# Patient Record
Sex: Female | Born: 1975 | Hispanic: Yes | Marital: Single | Smoking: Never smoker
Health system: Southern US, Community
[De-identification: ages and names within clinical notes are randomized; demographics above are authoritative.]

## PROBLEM LIST (undated history)

## (undated) ENCOUNTER — Inpatient Hospital Stay (HOSPITAL_COMMUNITY): Payer: Self-pay

## (undated) DIAGNOSIS — E119 Type 2 diabetes mellitus without complications: Secondary | ICD-10-CM

## (undated) DIAGNOSIS — K219 Gastro-esophageal reflux disease without esophagitis: Secondary | ICD-10-CM

## (undated) DIAGNOSIS — K760 Fatty (change of) liver, not elsewhere classified: Secondary | ICD-10-CM

## (undated) DIAGNOSIS — O24419 Gestational diabetes mellitus in pregnancy, unspecified control: Secondary | ICD-10-CM

## (undated) DIAGNOSIS — F419 Anxiety disorder, unspecified: Secondary | ICD-10-CM

## (undated) DIAGNOSIS — K8020 Calculus of gallbladder without cholecystitis without obstruction: Secondary | ICD-10-CM

## (undated) DIAGNOSIS — F32A Depression, unspecified: Secondary | ICD-10-CM

## (undated) HISTORY — DX: Fatty (change of) liver, not elsewhere classified: K76.0

## (undated) HISTORY — DX: Calculus of gallbladder without cholecystitis without obstruction: K80.20

## (undated) HISTORY — DX: Type 2 diabetes mellitus without complications: E11.9

## (undated) HISTORY — DX: Gestational diabetes mellitus in pregnancy, unspecified control: O24.419

## (undated) HISTORY — DX: Anxiety disorder, unspecified: F41.9

## (undated) HISTORY — DX: Depression, unspecified: F32.A

## (undated) HISTORY — DX: Gastro-esophageal reflux disease without esophagitis: K21.9

---

## 1998-10-29 ENCOUNTER — Ambulatory Visit (HOSPITAL_COMMUNITY): Admission: RE | Admit: 1998-10-29 | Discharge: 1998-10-29 | Payer: Self-pay | Admitting: *Deleted

## 1999-04-16 ENCOUNTER — Inpatient Hospital Stay (HOSPITAL_COMMUNITY): Admission: AD | Admit: 1999-04-16 | Discharge: 1999-04-17 | Payer: Self-pay | Admitting: Obstetrics

## 2000-08-22 ENCOUNTER — Ambulatory Visit (HOSPITAL_COMMUNITY): Admission: RE | Admit: 2000-08-22 | Discharge: 2000-08-22 | Payer: Self-pay | Admitting: Obstetrics

## 2001-01-25 ENCOUNTER — Inpatient Hospital Stay (HOSPITAL_COMMUNITY): Admission: AD | Admit: 2001-01-25 | Discharge: 2001-01-25 | Payer: Self-pay | Admitting: *Deleted

## 2001-01-29 ENCOUNTER — Encounter (HOSPITAL_COMMUNITY): Admission: RE | Admit: 2001-01-29 | Discharge: 2001-02-02 | Payer: Self-pay | Admitting: Obstetrics & Gynecology

## 2001-02-01 ENCOUNTER — Encounter (INDEPENDENT_AMBULATORY_CARE_PROVIDER_SITE_OTHER): Payer: Self-pay | Admitting: Specialist

## 2001-02-01 ENCOUNTER — Inpatient Hospital Stay (HOSPITAL_COMMUNITY): Admission: AD | Admit: 2001-02-01 | Discharge: 2001-02-04 | Payer: Self-pay | Admitting: Obstetrics & Gynecology

## 2001-02-01 DIAGNOSIS — O321XX0 Maternal care for breech presentation, not applicable or unspecified: Secondary | ICD-10-CM

## 2004-03-12 ENCOUNTER — Inpatient Hospital Stay (HOSPITAL_COMMUNITY): Admission: AD | Admit: 2004-03-12 | Discharge: 2004-03-13 | Payer: Self-pay | Admitting: Family Medicine

## 2004-03-13 IMAGING — US US OB TRANSVAGINAL MODIFY
1 series · 18 of 28 positions shown · non-contrast
Comparison: none

CLINICAL DATA: By dates, 10 weeks 6 days pregnant.  Vaginal bleeding for 3 days.
 OBSTETRICAL ULTRASOUND WITH TRANSVAGINAL EVALUATION
 No comparison.  The patient?s LMP is [DATE].
 Transabdominal and endovaginal imaging was performed.  There is an intrauterine gestational sac which is irregular in shape.  This has a mean sac diameter of 1.4 cm, corresponding with a gestational age of 6 weeks 2 days.  Irregular subchorionic hematoma is noted.  No yolk sac or embryo is seen within the gestational sac.
 Both maternal ovaries appear normal, measuring 3.3 x 2.0 x 1.8 cm on the right and 2.1 x 1.5 x 2.5 cm on the left.  There is a corpus luteal cyst on the right.  No free pelvic fluid is present.
 IMPRESSION 
 There is an intrauterine gestational sac, but no embryo or yolk sac is identified.  A yolk sac should be identified by this age, but an embryo is not necessarily visible.  These findings are suspicious for an abortion in progress or an anembryonic pregnancy.  These findings are not definitive, and correlation with serial quantitative beta hCG level and follow-up ultrasound, if necessary, are recommended.

[Series 1: us ob comp<14 wk · 18 of 40 slices shown]
[im 1/40]
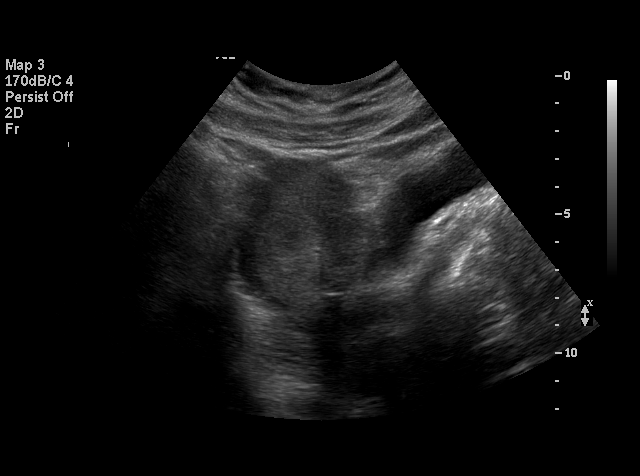
[im 3/40]
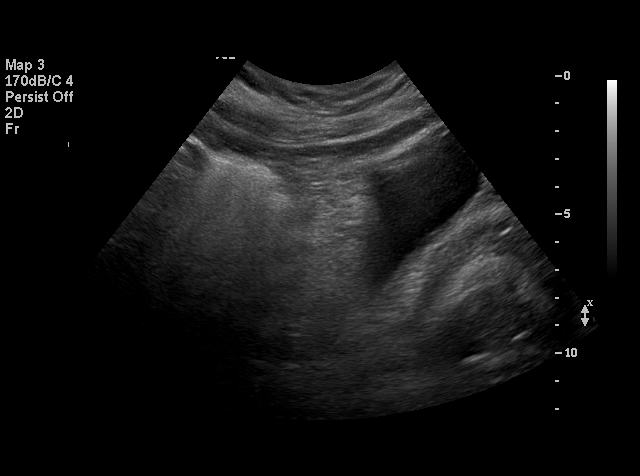
[im 5/40]
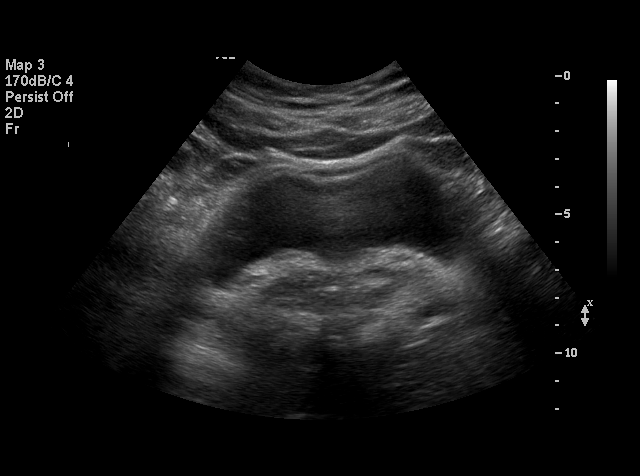
[im 8/40]
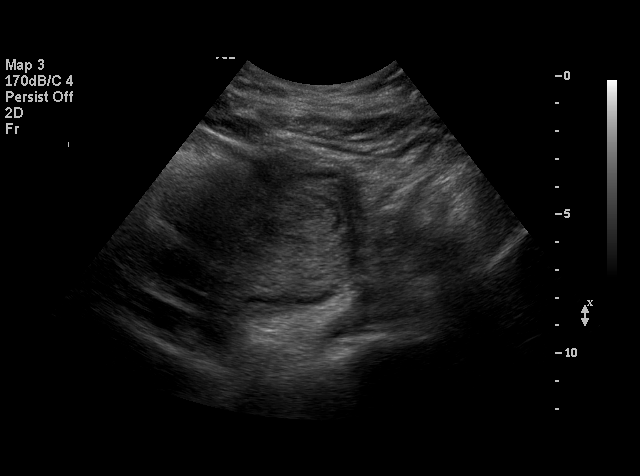
[im 11/40]
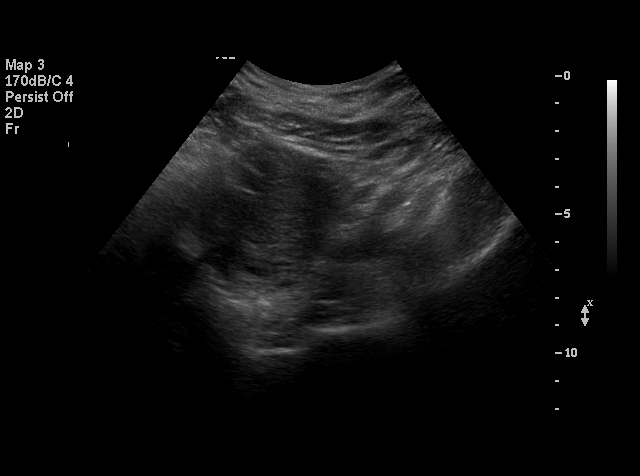
[im 12/40]
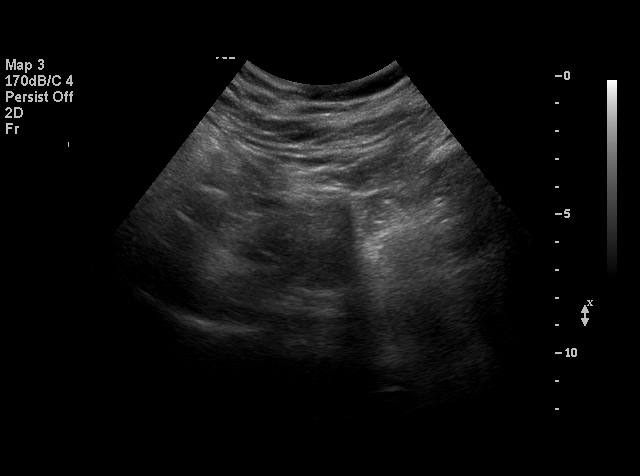
[im 15/40]
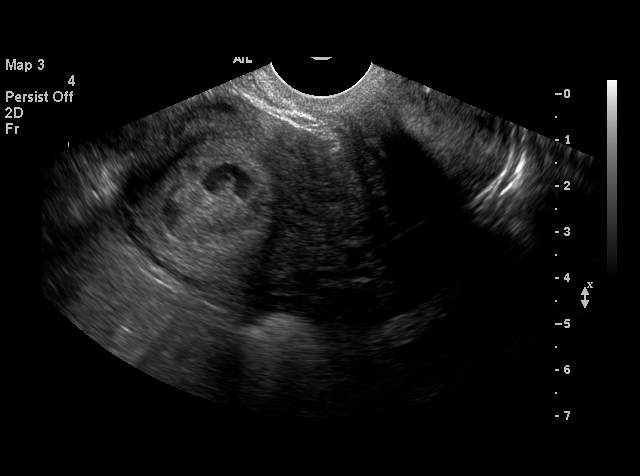
[im 16/40]
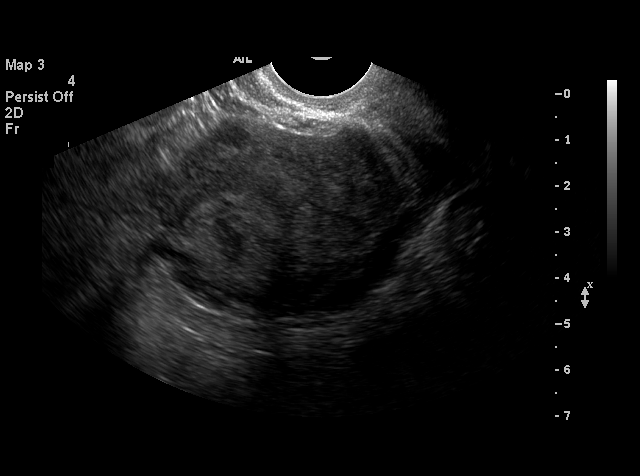
[im 19/40]
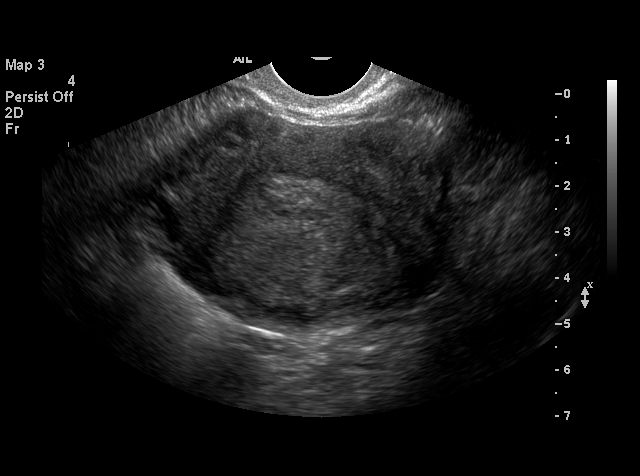
[im 21/40]
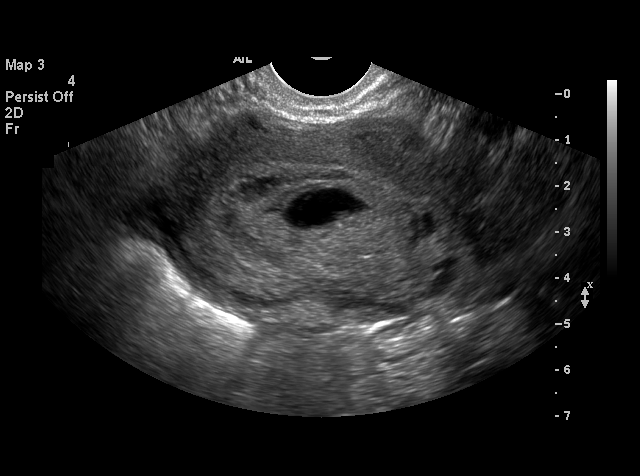
[im 24/40]
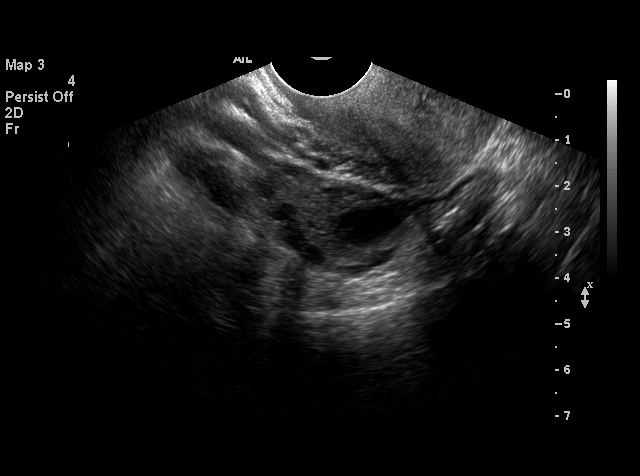
[im 25/40]
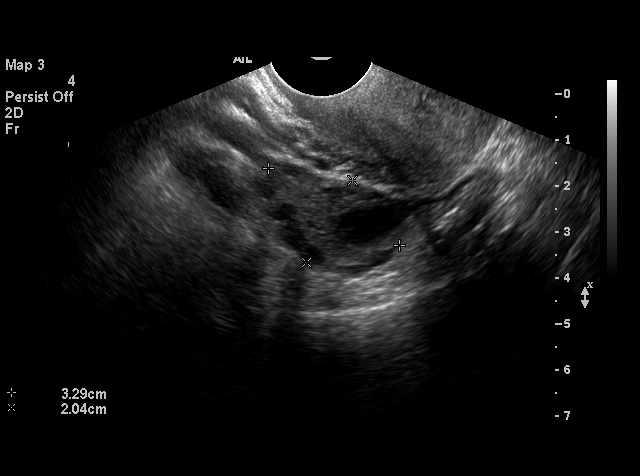
[im 28/40]
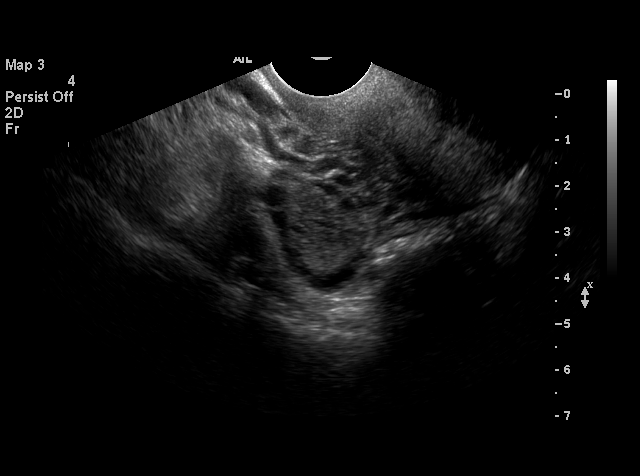
[im 31/40]
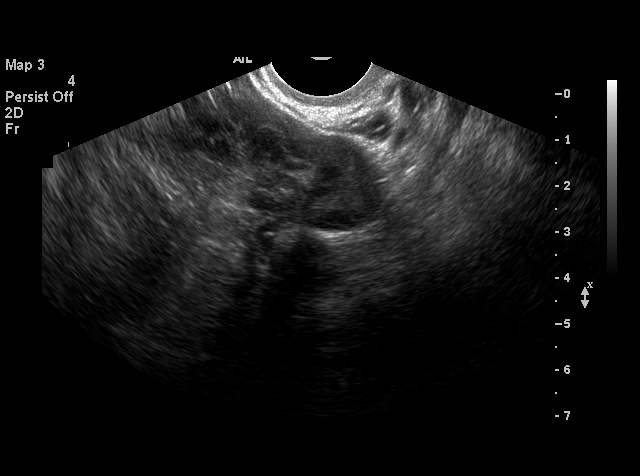
[im 32/40]
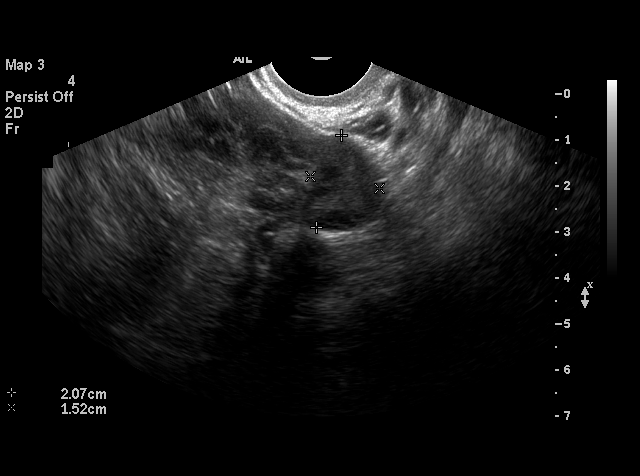
[im 35/40]
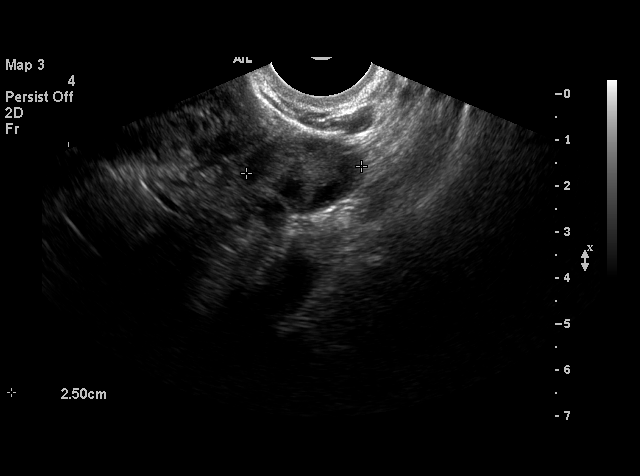
[im 37/40]
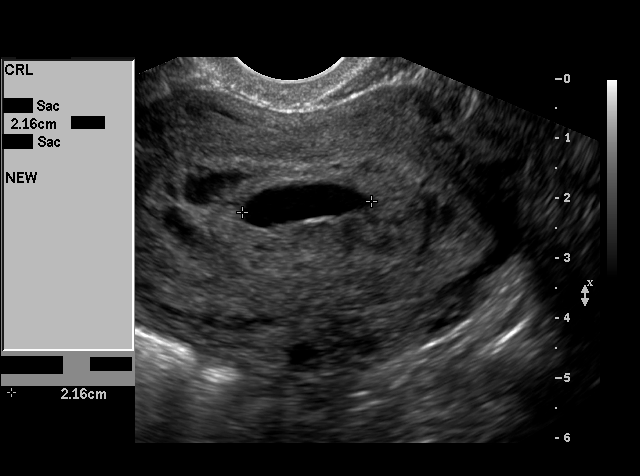
[im 40/40]
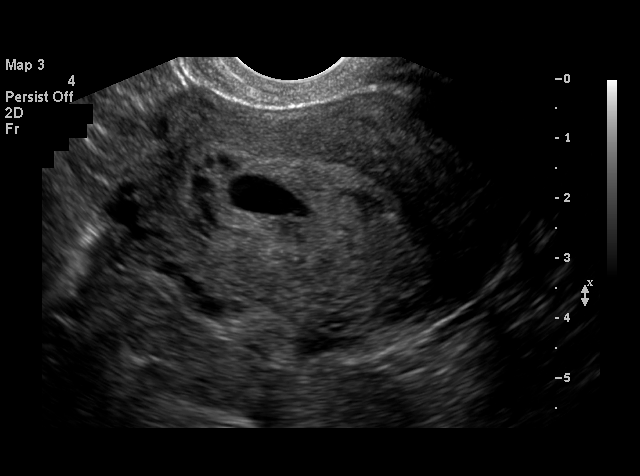

[18 of 28 positions shown; findings below may reference images not displayed]

## 2004-03-18 ENCOUNTER — Inpatient Hospital Stay (HOSPITAL_COMMUNITY): Admission: RE | Admit: 2004-03-18 | Discharge: 2004-03-18 | Payer: Self-pay | Admitting: Family Medicine

## 2004-03-18 IMAGING — US US OB TRANSVAGINAL
1 series · 14 of 28 positions shown · non-contrast
Comparison: none

CLINICAL DATA: Reevaluate intrauterine gestational sac.  Continued vaginal bleeding with cramping.
TRANSVAGINAL OBSTETRICAL ULTRASOUND:
Multiple images of the uterus and adnexa were obtained using an endovaginal approach. Comparison is made with the previous exam on [DATE].  
The endometrial lining today appears thin with a maximal AP width measured at 8.2 mm.  No evidence for a gestational sac is seen and the overall appearance is compatible with an interval spontaneous abortion with no evidence for retained products of conception sonographically.
Both ovaries are visualized with the left ovary measuring 2.7 x 1.3 x 2.5 cm and the right ovary measuring 3.1 x 1.7 x 1.0 cm.  This ovary contains a corpus luteum cyst.  No cul-de-sac or periovarian fluid is seen.
IMPRESSION
Findings compatible with a complete spontaneous abortion.  
Normalovaries.

[Series 1: unknown · 0.15mm/px · 14 of 41 slices shown]
[im 2/41]
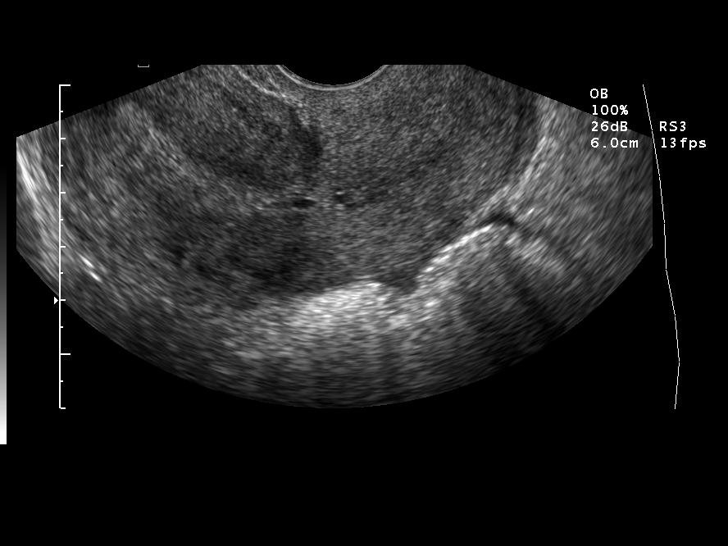
[im 5/41]
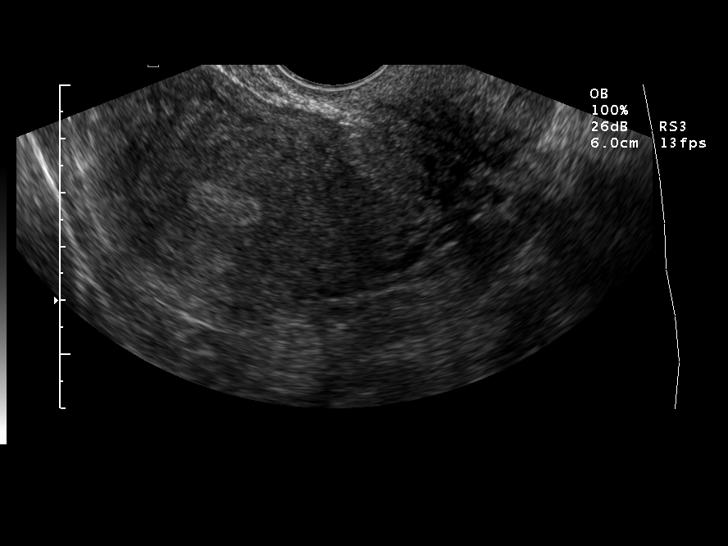
[im 8/41]
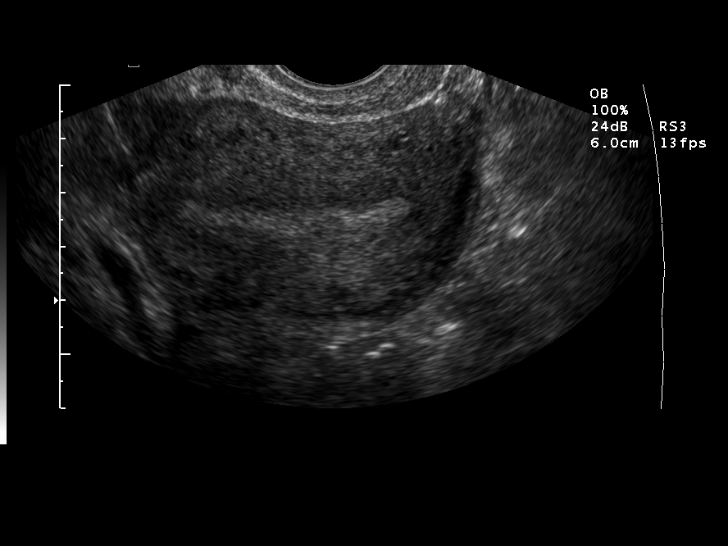
[im 11/41]
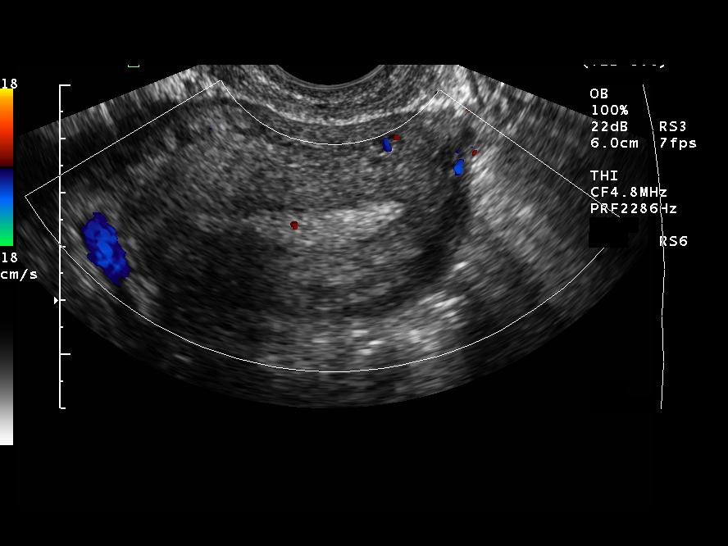
[im 14/41]
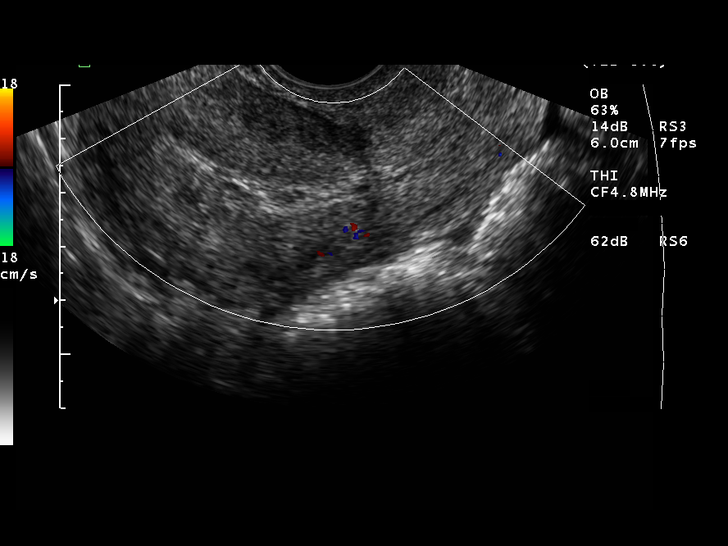
[im 17/41]
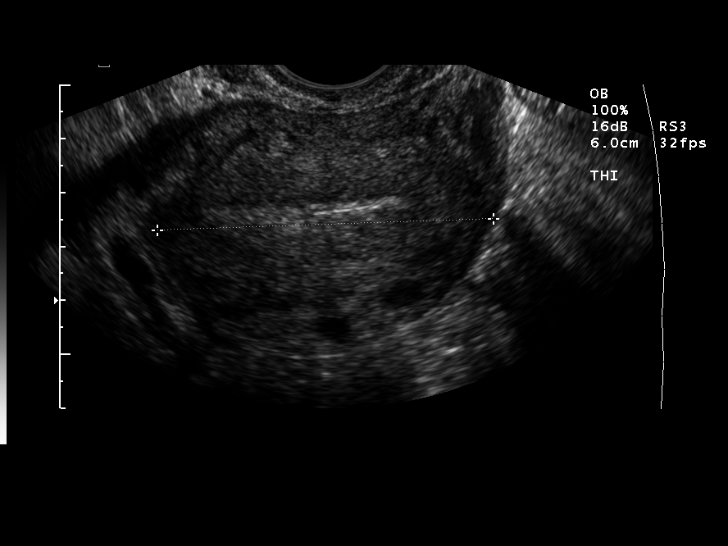
[im 20/41]
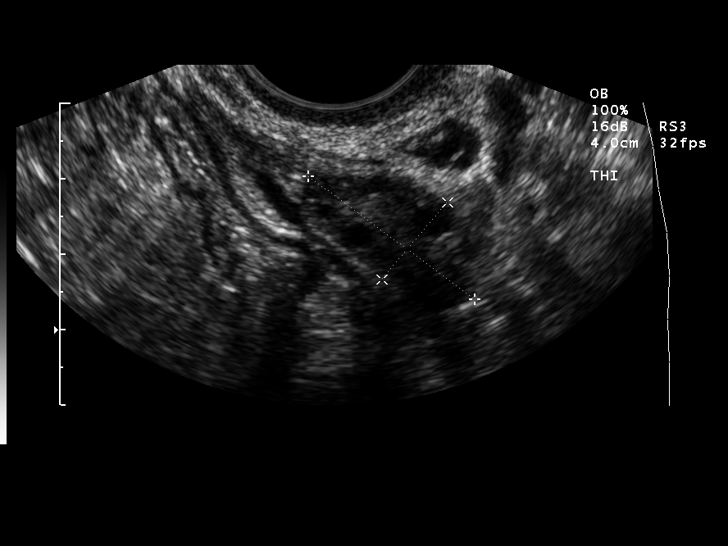
[im 23/41]
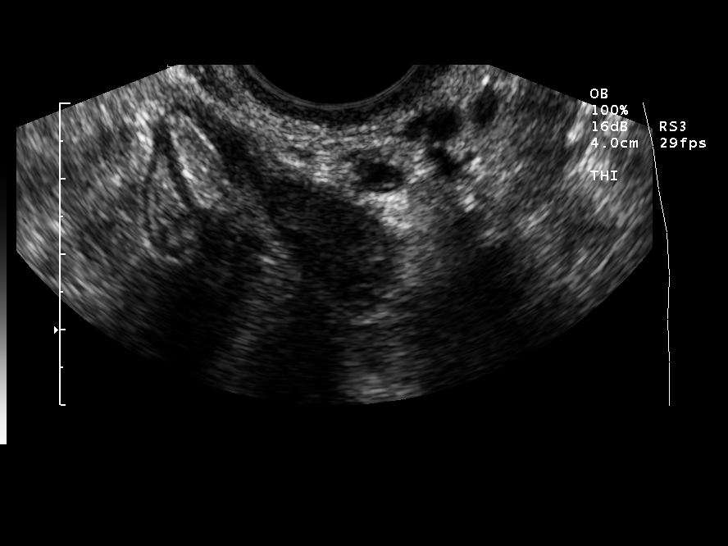
[im 26/41]
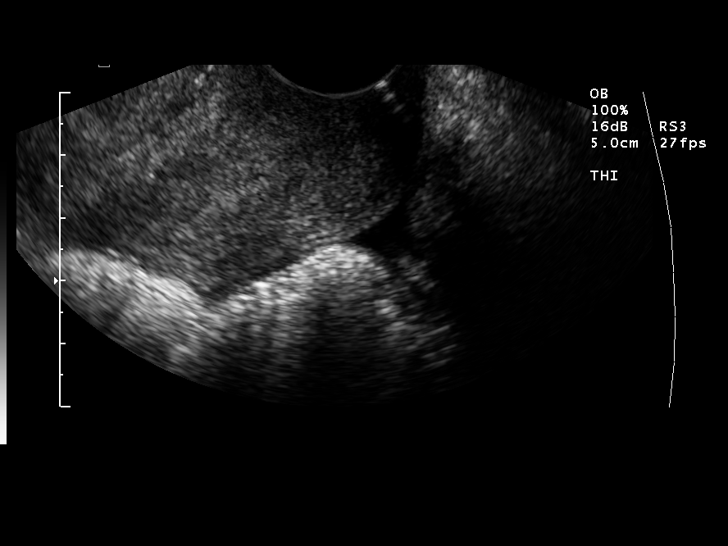
[im 29/41]
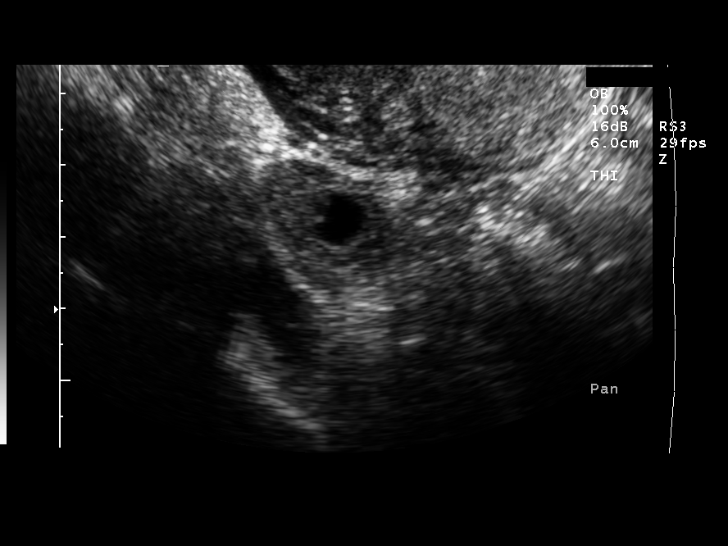
[im 32/41]
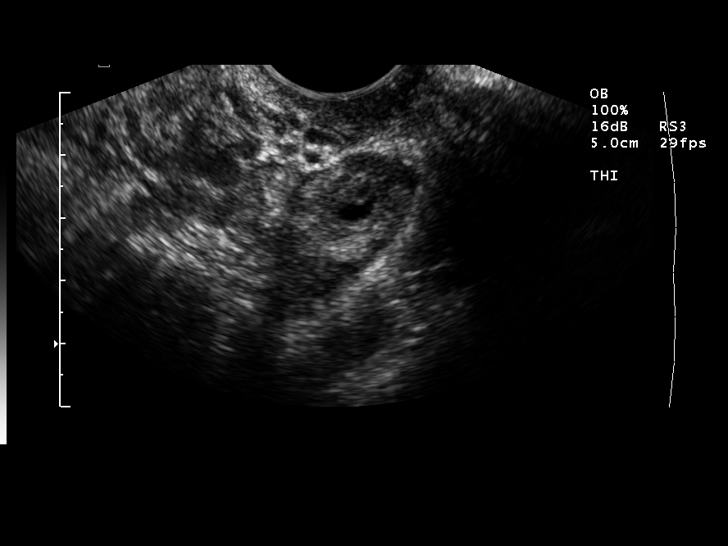
[im 35/41]
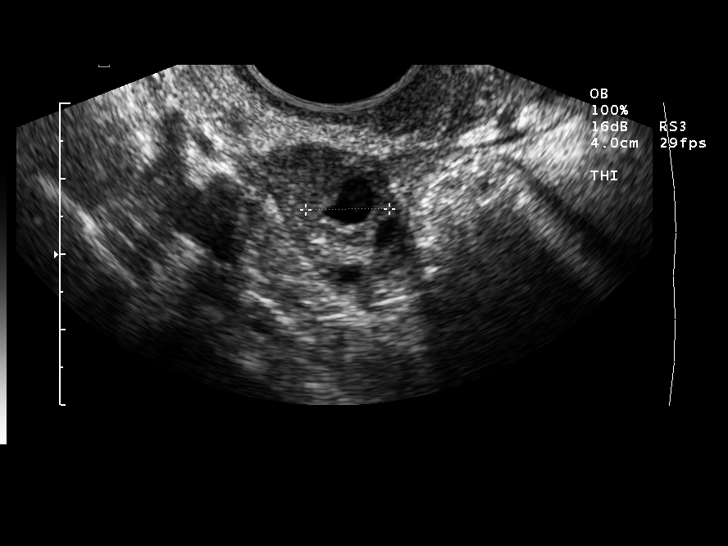
[im 38/41]
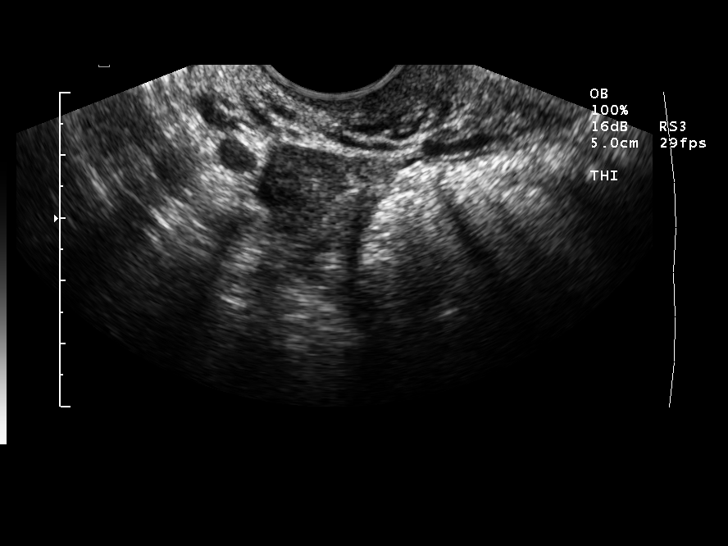
[im 41/41]
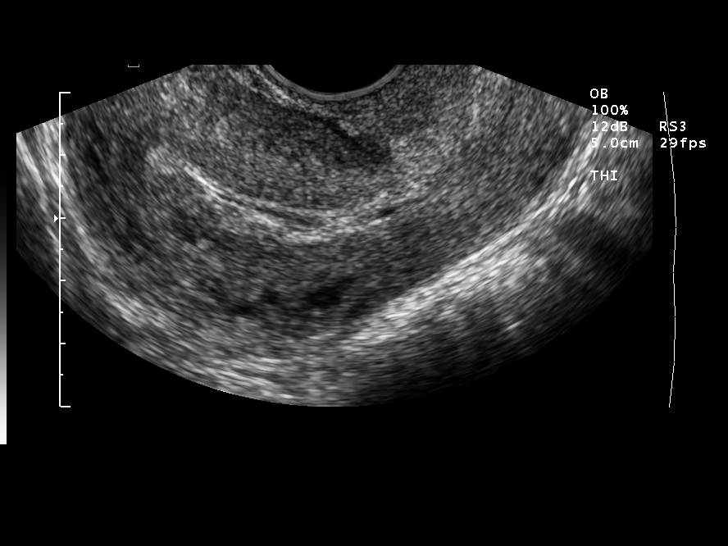

[14 of 28 positions shown; findings below may reference images not displayed]

## 2004-10-05 ENCOUNTER — Ambulatory Visit: Payer: Self-pay | Admitting: Family Medicine

## 2004-10-11 ENCOUNTER — Ambulatory Visit: Payer: Self-pay | Admitting: Sports Medicine

## 2004-11-11 ENCOUNTER — Ambulatory Visit: Payer: Self-pay | Admitting: Sports Medicine

## 2004-11-12 ENCOUNTER — Ambulatory Visit (HOSPITAL_COMMUNITY): Admission: RE | Admit: 2004-11-12 | Discharge: 2004-11-12 | Payer: Self-pay | Admitting: Internal Medicine

## 2004-11-12 IMAGING — US US OB COMP +14 WK
1 series · 18 of 28 positions shown · non-contrast
Comparison: none

CLINICAL DATA: 18 week 5 day gestational age by LMP.  Evaluate dating and anatomy.

[Series 1: us ob comp +14 wk · 18 of 62 slices shown]
[im 1/62]
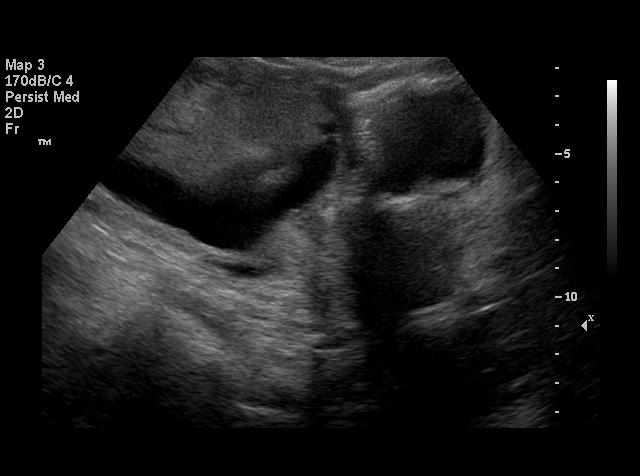
[im 5/62]
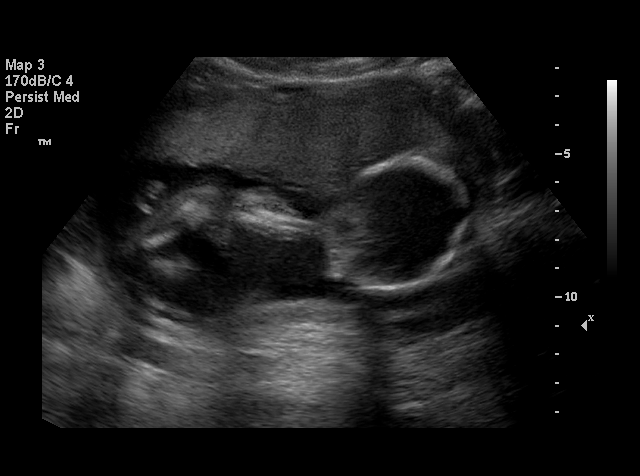
[im 7/62]
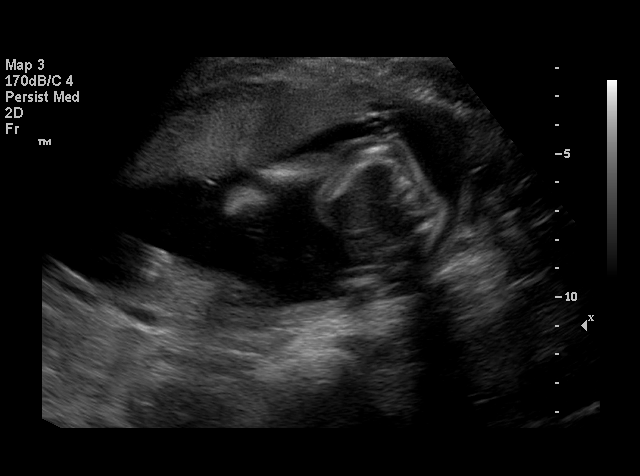
[im 12/62]
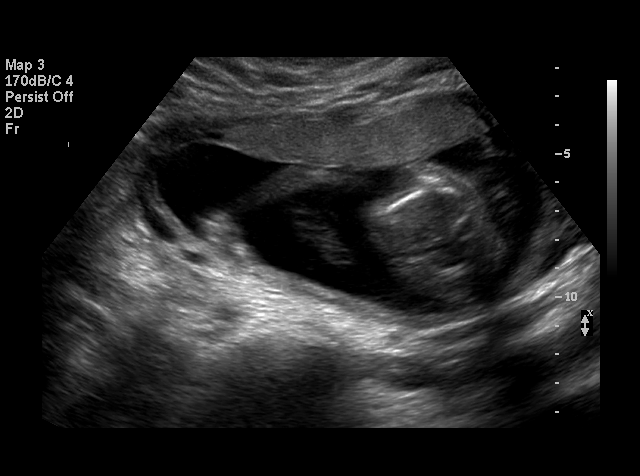
[im 16/62]
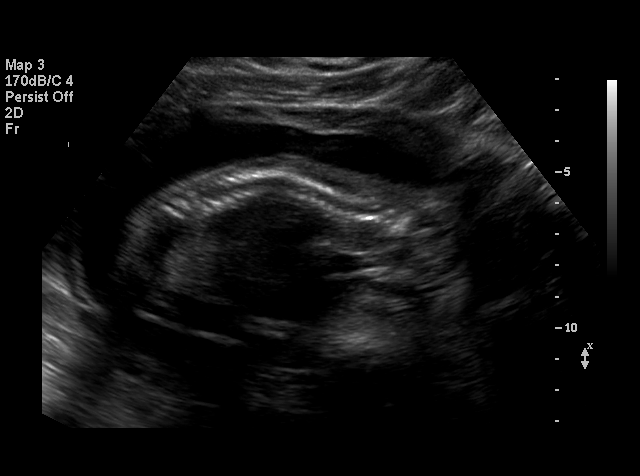
[im 19/62]
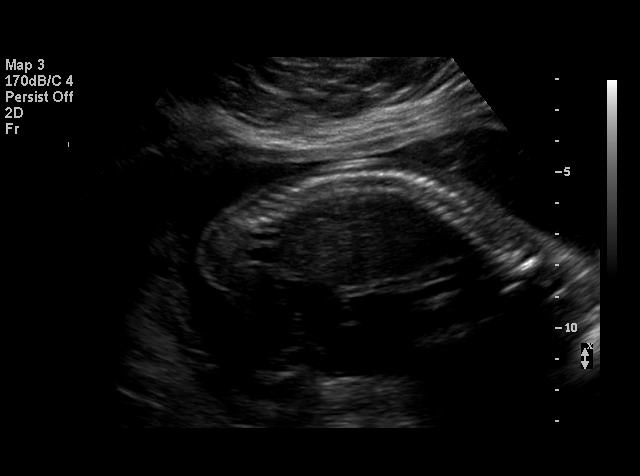
[im 23/62]
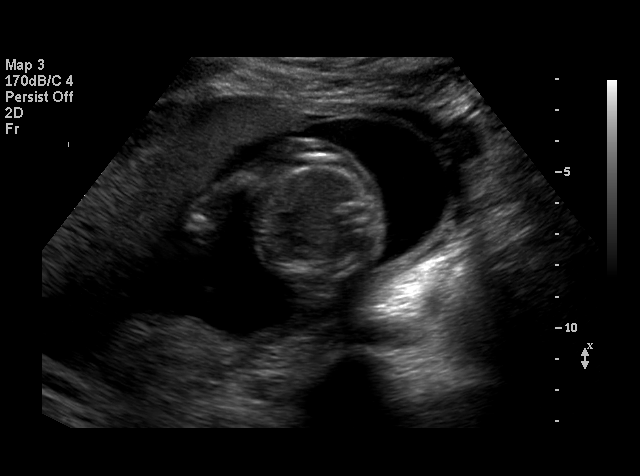
[im 25/62]
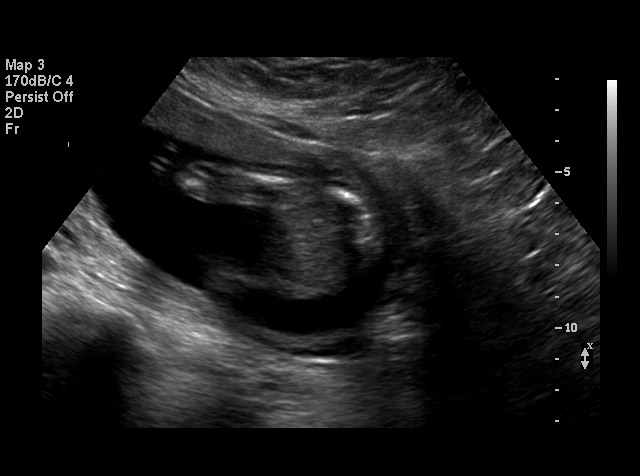
[im 30/62]
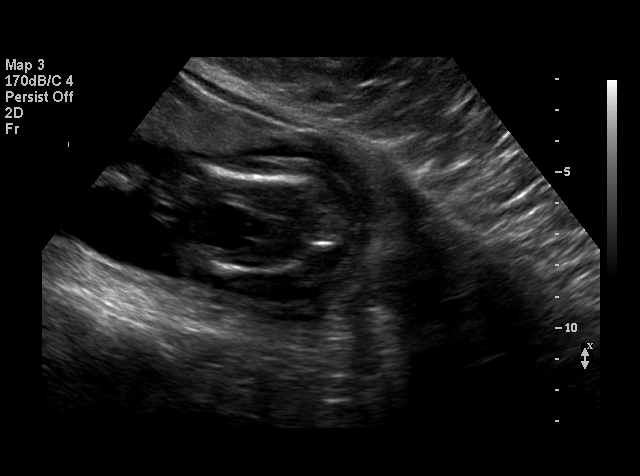
[im 32/62]
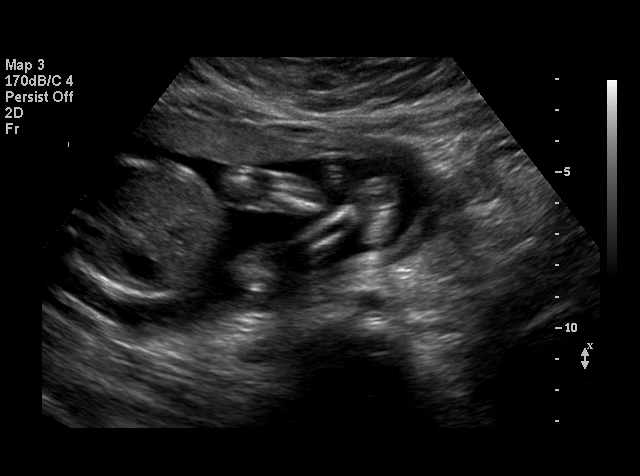
[im 37/62]
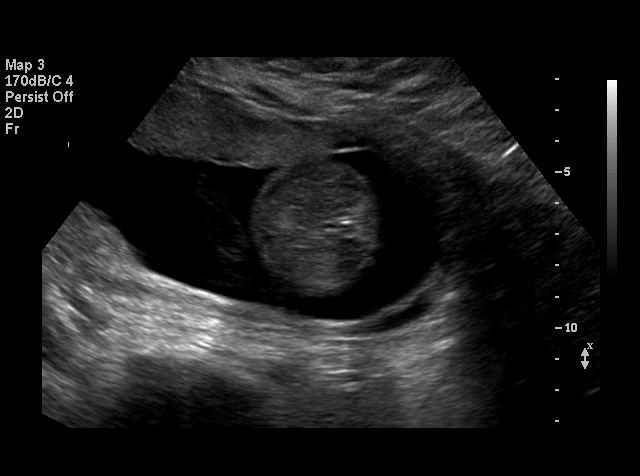
[im 39/62]
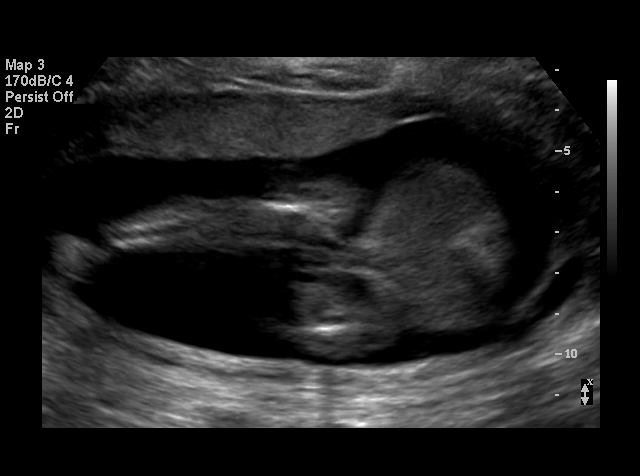
[im 43/62]
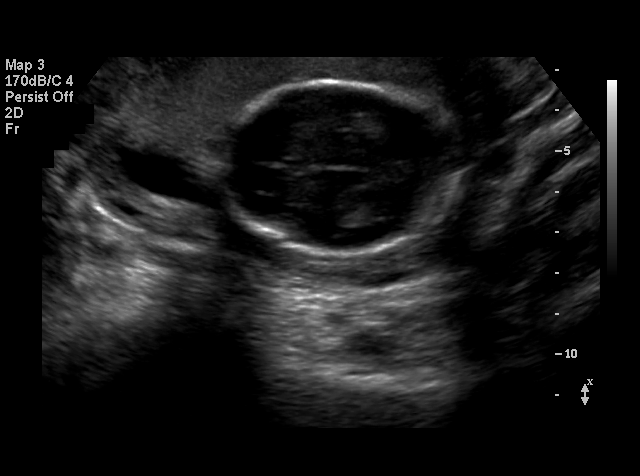
[im 48/62]
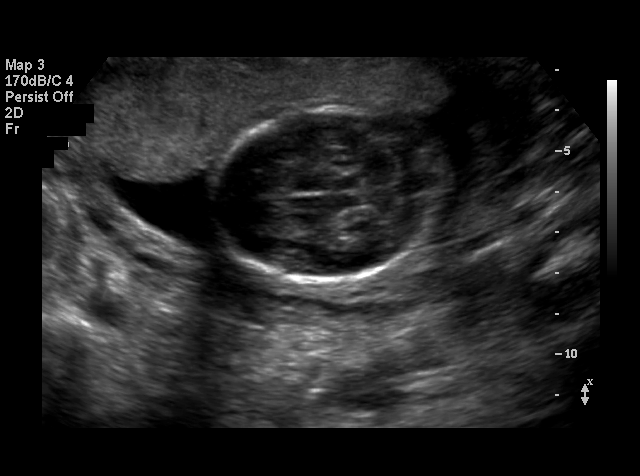
[im 50/62]
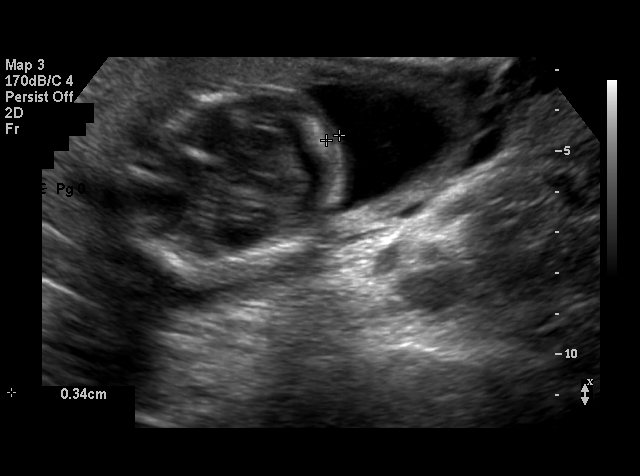
[im 55/62]
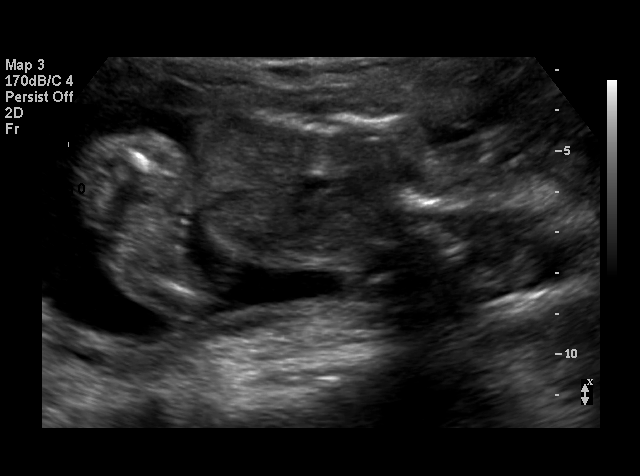
[im 57/62]
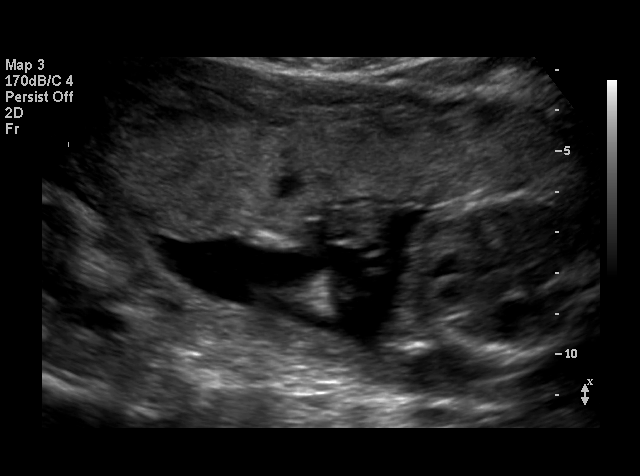
[im 62/62]
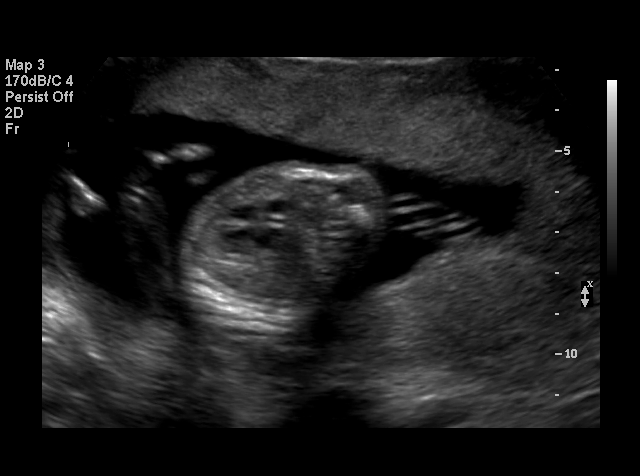

[18 of 28 positions shown; findings below may reference images not displayed]

OBSTETRICAL ULTRASOUND:
 Number of Fetuses:  1
 Heart Rate:  146
 Movement:  Yes
 Breathing:    No  
 Presentation:  Cephalic
 Placental Location:  Anterior
 Grade: I
 Previa: No
 Amniotic Fluid (Subjective):  Normal
 Amniotic Fluid (Objective):   3.5 cm Vertical pocket 

 FETAL BIOMETRY
 BPD:   4.2 cm   18 w 5 d
 HC:   16.9 cm   19 w 4 d
 AC:   14.0 cm   19 w 6 d
 FL:    3.3 cm   20 w 3 d

 MEAN GA:  19 w 5 d

 FETAL ANATOMY
 Lateral Ventricles:    Visualized 
 Thalami/CSP:      Visualized 
 Posterior Fossa:  Visualized 
 Nuchal Region:    Visualized 
 Spine:      Visualized 
 4 Chamber Heart on Left:      Visualized 
 Stomach on Left:      Visualized 
 3 Vessel Cord:    Visualized 
 Cord Insertion site:    Visualized 
 Kidneys:  Visualized 
 Bladder:  Visualized 
 Extremities:      Visualized 

 ADDITIONAL ANATOMY VISUALIZED:  Upper lip, orbits, diaphragm, heel, and female genitalia.  

 Evaluation limited by:  Maternal habitus and fetal position. 

 MATERNAL FINDINGS
 Cervix:   3.9 cm Transabdominally
IMPRESSION: Single living intrauterine fetus with mean gestational age of 19 weeks 5 days and sonographic EDC of [DATE].  This is 1 week ahead of LMP.  
 No evidence of fetal anatomic abnormality.  

 </u12:p>

## 2004-12-07 ENCOUNTER — Ambulatory Visit: Payer: Self-pay | Admitting: Family Medicine

## 2005-01-06 ENCOUNTER — Ambulatory Visit: Payer: Self-pay | Admitting: Family Medicine

## 2005-01-07 ENCOUNTER — Ambulatory Visit: Payer: Self-pay | Admitting: Sports Medicine

## 2005-01-11 ENCOUNTER — Ambulatory Visit: Payer: Self-pay | Admitting: Family Medicine

## 2005-02-10 ENCOUNTER — Ambulatory Visit: Payer: Self-pay | Admitting: Family Medicine

## 2005-02-23 ENCOUNTER — Ambulatory Visit: Payer: Self-pay | Admitting: Family Medicine

## 2005-03-10 ENCOUNTER — Ambulatory Visit: Payer: Self-pay | Admitting: Family Medicine

## 2005-03-22 ENCOUNTER — Ambulatory Visit: Payer: Self-pay | Admitting: Family Medicine

## 2005-03-31 ENCOUNTER — Ambulatory Visit: Payer: Self-pay | Admitting: Family Medicine

## 2005-04-06 ENCOUNTER — Ambulatory Visit: Payer: Self-pay | Admitting: Family Medicine

## 2005-04-11 ENCOUNTER — Inpatient Hospital Stay (HOSPITAL_COMMUNITY): Admission: AD | Admit: 2005-04-11 | Discharge: 2005-04-11 | Payer: Self-pay | Admitting: Obstetrics and Gynecology

## 2005-04-14 ENCOUNTER — Inpatient Hospital Stay (HOSPITAL_COMMUNITY): Admission: AD | Admit: 2005-04-14 | Discharge: 2005-04-14 | Payer: Self-pay | Admitting: Family Medicine

## 2005-04-14 ENCOUNTER — Ambulatory Visit: Payer: Self-pay | Admitting: Family Medicine

## 2005-04-16 ENCOUNTER — Ambulatory Visit: Payer: Self-pay | Admitting: Certified Nurse Midwife

## 2005-04-16 ENCOUNTER — Inpatient Hospital Stay (HOSPITAL_COMMUNITY): Admission: AD | Admit: 2005-04-16 | Discharge: 2005-04-18 | Payer: Self-pay | Admitting: *Deleted

## 2005-05-26 ENCOUNTER — Encounter (INDEPENDENT_AMBULATORY_CARE_PROVIDER_SITE_OTHER): Payer: Self-pay | Admitting: *Deleted

## 2005-05-26 ENCOUNTER — Ambulatory Visit: Payer: Self-pay | Admitting: Family Medicine

## 2005-05-26 LAB — CONVERTED CEMR LAB

## 2005-06-22 ENCOUNTER — Ambulatory Visit: Payer: Self-pay | Admitting: Family Medicine

## 2005-11-24 ENCOUNTER — Ambulatory Visit: Payer: Self-pay | Admitting: Sports Medicine

## 2006-01-06 ENCOUNTER — Ambulatory Visit: Payer: Self-pay | Admitting: Sports Medicine

## 2006-04-19 ENCOUNTER — Ambulatory Visit: Payer: Self-pay | Admitting: Family Medicine

## 2006-05-31 ENCOUNTER — Ambulatory Visit: Payer: Self-pay | Admitting: Family Medicine

## 2007-02-23 ENCOUNTER — Encounter (INDEPENDENT_AMBULATORY_CARE_PROVIDER_SITE_OTHER): Payer: Self-pay | Admitting: *Deleted

## 2007-11-21 ENCOUNTER — Ambulatory Visit: Payer: Self-pay | Admitting: Family Medicine

## 2008-11-14 ENCOUNTER — Emergency Department (HOSPITAL_COMMUNITY): Admission: EM | Admit: 2008-11-14 | Discharge: 2008-11-14 | Payer: Self-pay | Admitting: Emergency Medicine

## 2008-11-16 ENCOUNTER — Emergency Department (HOSPITAL_COMMUNITY): Admission: EM | Admit: 2008-11-16 | Discharge: 2008-11-16 | Payer: Self-pay | Admitting: Emergency Medicine

## 2008-12-12 ENCOUNTER — Ambulatory Visit: Payer: Self-pay | Admitting: Family Medicine

## 2008-12-12 ENCOUNTER — Encounter (INDEPENDENT_AMBULATORY_CARE_PROVIDER_SITE_OTHER): Payer: Self-pay | Admitting: Family Medicine

## 2008-12-12 ENCOUNTER — Ambulatory Visit (HOSPITAL_COMMUNITY): Admission: RE | Admit: 2008-12-12 | Discharge: 2008-12-12 | Payer: Self-pay | Admitting: Family Medicine

## 2008-12-12 LAB — CONVERTED CEMR LAB
Bilirubin Urine: NEGATIVE
Glucose, Urine, Semiquant: NEGATIVE
Protein, U semiquant: NEGATIVE
Specific Gravity, Urine: 1.01
Urobilinogen, UA: 0.2
WBC Urine, dipstick: NEGATIVE

## 2008-12-12 IMAGING — CT CT PELVIS W/O CM
2 of 4 series · 17 of 46 positions shown, 19 images · non-contrast
Comparison: None.

CT ABDOMEN

CLINICAL DATA: Right flank pain.  Question kidney stone.
Hematuria.

CT OF THE ABDOMEN AND PELVIS WITHOUT CONTRAST (CT UROGRAM)
TECHNIQUE: Multidetector CT imaging was performed through the
abdomen and pelvis to include the urinary tract.

[Series 3: >200 lbs-stone 5.0 b31f · axial · 0.71mm/px · z∈[-401,-11]mm · 14 of 87 slices shown, 16 images]
[im 6/87  soft-tissue]
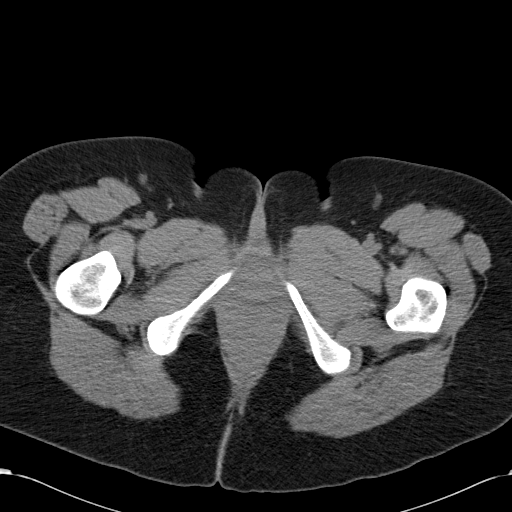
[im 6/87  bone]
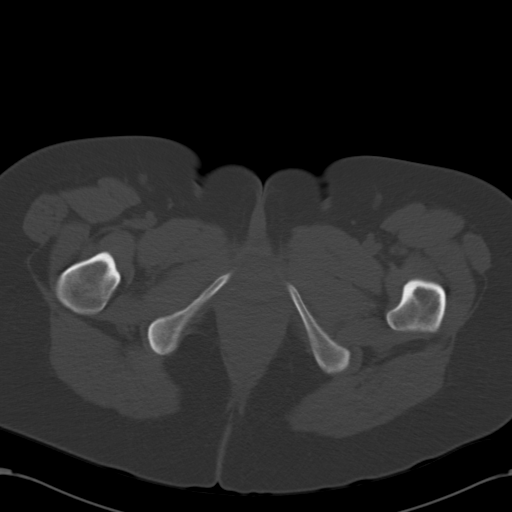
[im 12/87  soft-tissue]
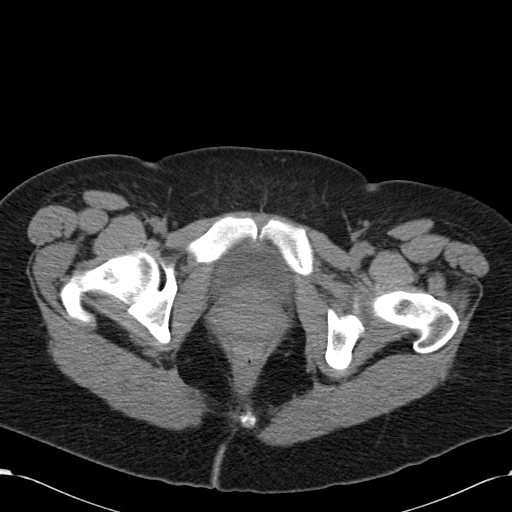
[im 18/87  soft-tissue]
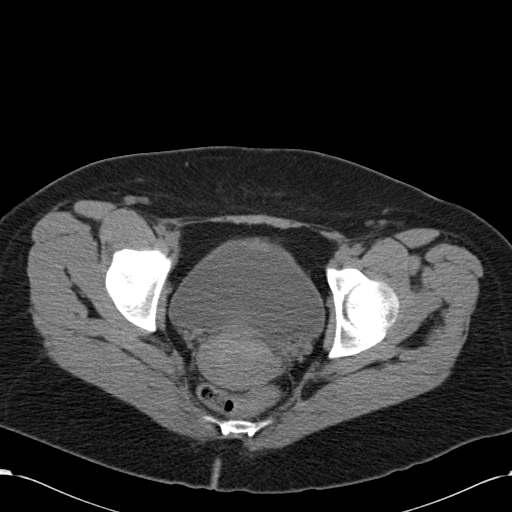
[im 24/87  soft-tissue]
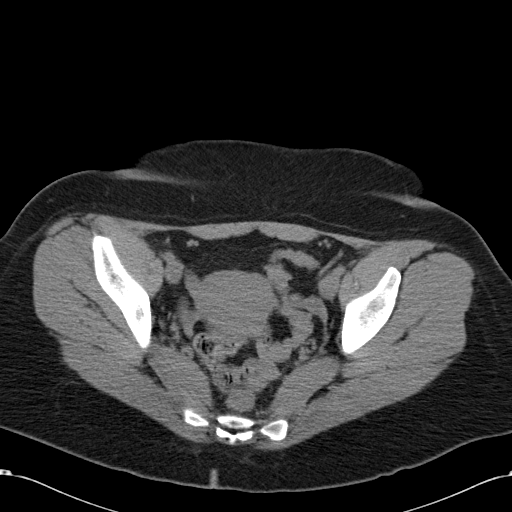
[im 30/87  soft-tissue]
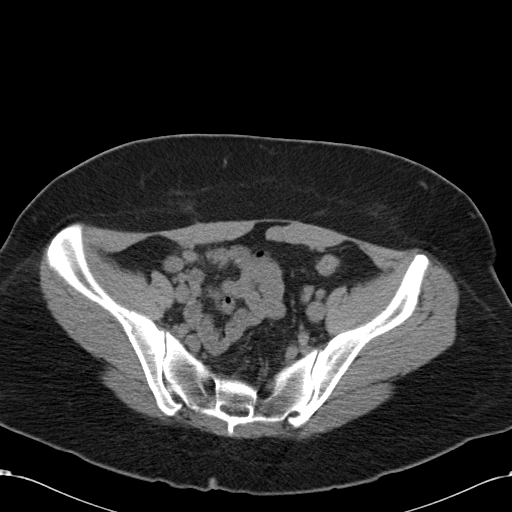
[im 36/87  soft-tissue]
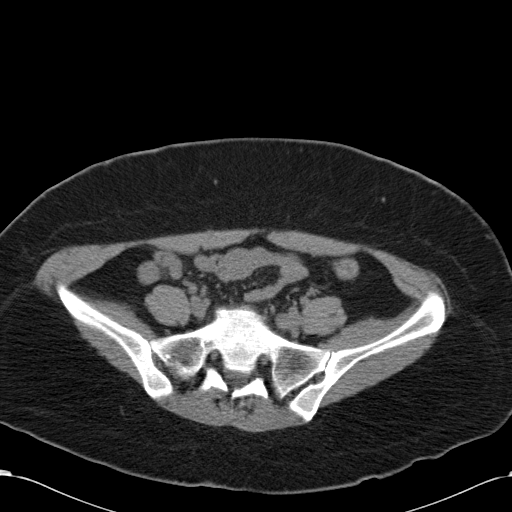
[im 42/87  soft-tissue]
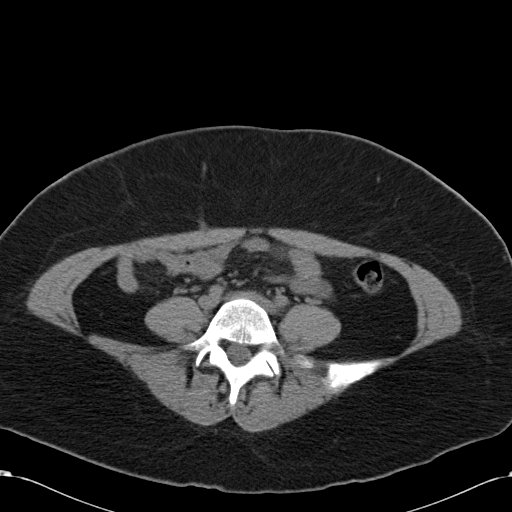
[im 48/87  soft-tissue]
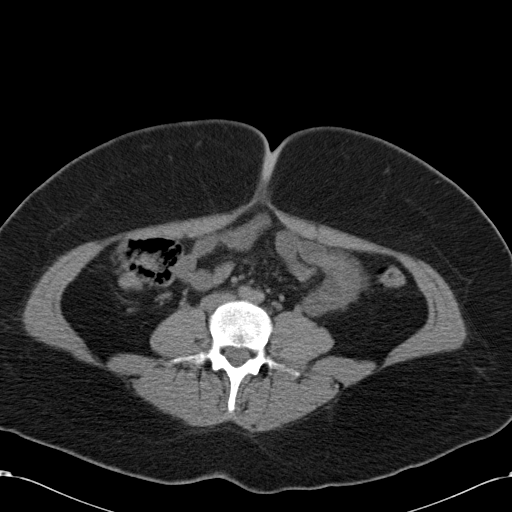
[im 54/87  soft-tissue]
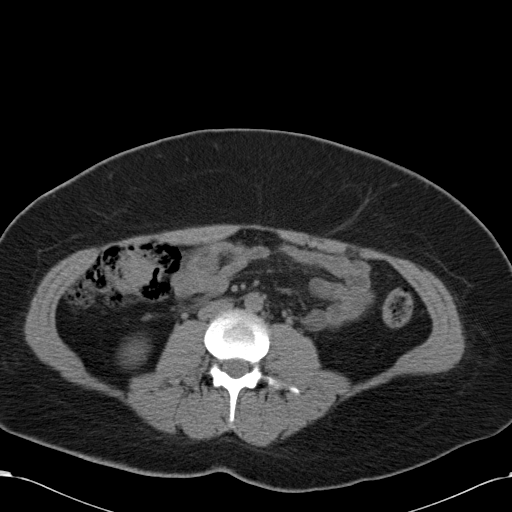
[im 54/87  bone]
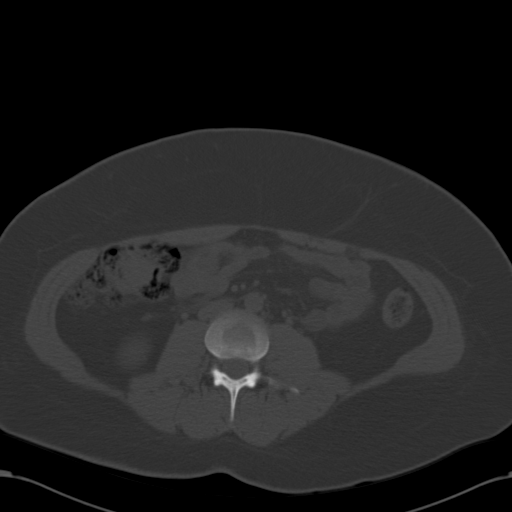
[im 60/87  soft-tissue]
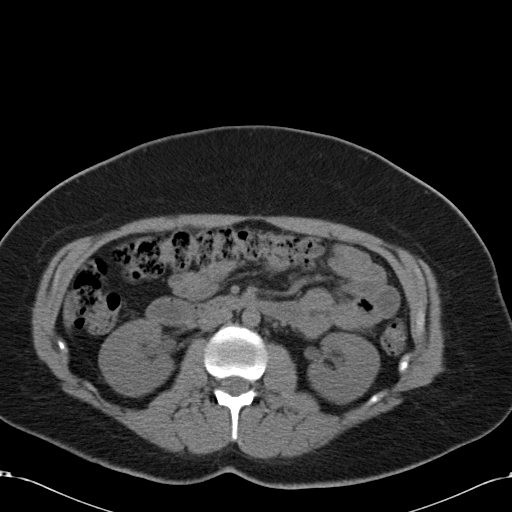
[im 66/87  soft-tissue]
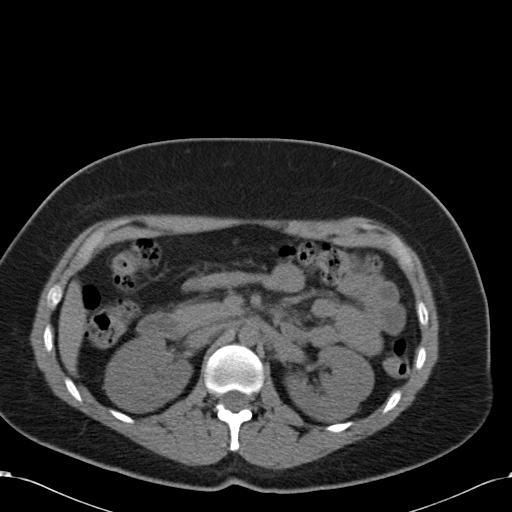
[im 72/87  soft-tissue]
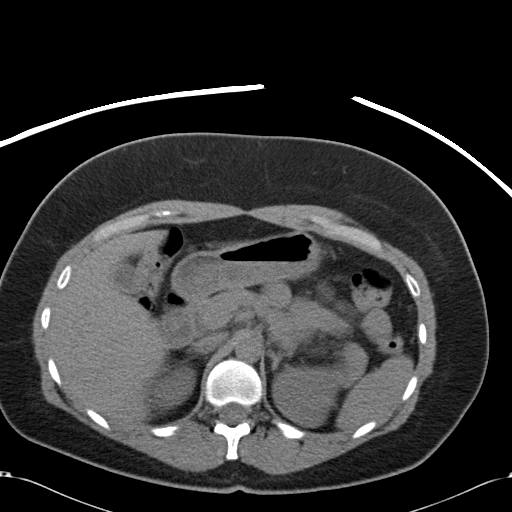
[im 78/87  soft-tissue]
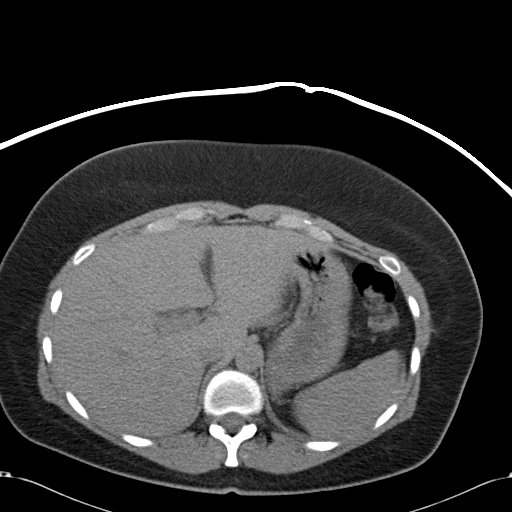
[im 84/87  soft-tissue]
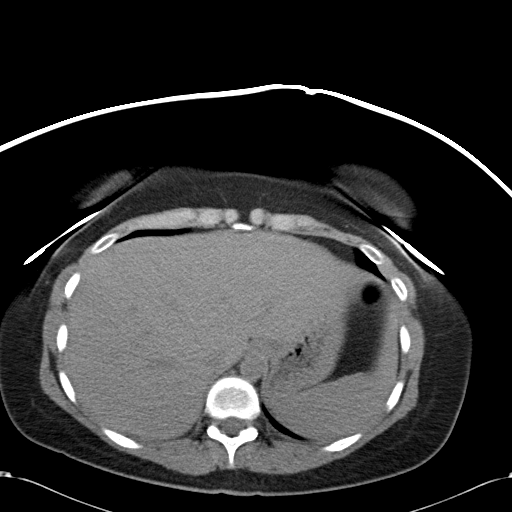

[Series 602: cor abd/pel · coronal · 0.84mm/px · 3 of 81 slices shown]
[im 27/81  soft-tissue]
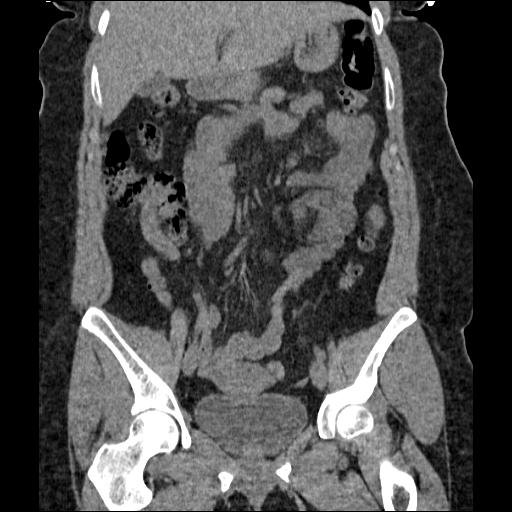
[im 36/81  soft-tissue]
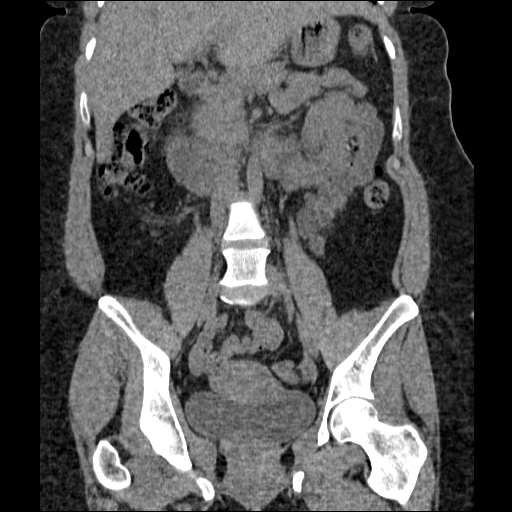
[im 45/81  soft-tissue]
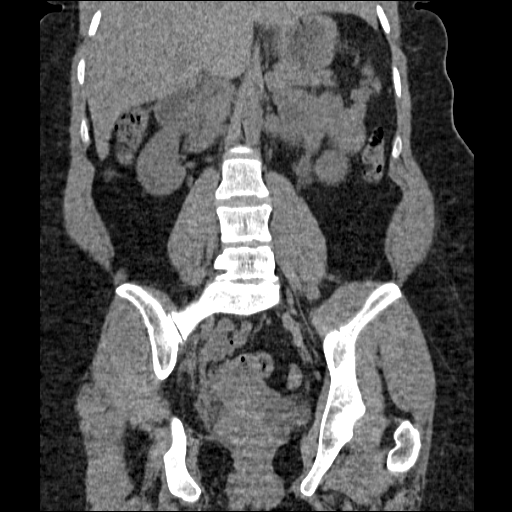

[17 of 46 positions shown; findings below may reference images not displayed]

FINDINGS: No focal abnormalities seen in the visualized portions
of the liver or spleen on this study without intravenous contrast.
The stomach, duodenum, pancreas, gallbladder, adrenal glands, and
kidneys are unremarkable.  Specifically, there is no evidence for
nephrolithiasis.  No secondary change in either kidney.

No abdominal aortic aneurysm.  No intraperitoneal free fluid.  No
evidence for abdominal lymphadenopathy.  The abdominal bowel loops
are unremarkable.
IMPRESSION: No acute findings in the abdomen.

CT PELVIS
FINDINGS: No evidence for distal ureteral stones.  No stones are
seen in the urinary bladder.  A single phlebolith is identified in
the left pelvis.  Uterus is unremarkable.  No evidence for an
adnexal mass.

No pelvic lymphadenopathy.  No intraperitoneal free fluid.  No
substantial diverticular change in the colon.  No diverticulitis.
Terminal ileum is normal.  The appendix is normal.

Bone windows show no worrisome lytic or sclerotic osseous lesions.
IMPRESSION: No distal urinary stones.

No acute findings in the anatomic pelvis.

## 2008-12-15 ENCOUNTER — Ambulatory Visit: Payer: Self-pay | Admitting: Family Medicine

## 2009-01-15 ENCOUNTER — Encounter (INDEPENDENT_AMBULATORY_CARE_PROVIDER_SITE_OTHER): Payer: Self-pay | Admitting: Family Medicine

## 2009-01-15 ENCOUNTER — Ambulatory Visit: Payer: Self-pay | Admitting: Family Medicine

## 2009-01-15 DIAGNOSIS — E669 Obesity, unspecified: Secondary | ICD-10-CM | POA: Insufficient documentation

## 2009-01-15 LAB — CONVERTED CEMR LAB
HDL: 48 mg/dL (ref >39)
LDL Cholesterol: 72 mg/dL (ref 0–99)
Total CHOL/HDL Ratio: 3.1
Triglycerides: 149 mg/dL (ref <150)
VLDL: 30 mg/dL (ref 0–40)

## 2009-01-19 ENCOUNTER — Encounter (INDEPENDENT_AMBULATORY_CARE_PROVIDER_SITE_OTHER): Payer: Self-pay | Admitting: Family Medicine

## 2009-03-13 ENCOUNTER — Ambulatory Visit: Payer: Self-pay | Admitting: Family Medicine

## 2009-03-13 ENCOUNTER — Encounter (INDEPENDENT_AMBULATORY_CARE_PROVIDER_SITE_OTHER): Payer: Self-pay | Admitting: Family Medicine

## 2009-03-20 ENCOUNTER — Ambulatory Visit: Payer: Self-pay | Admitting: Family Medicine

## 2009-04-17 ENCOUNTER — Ambulatory Visit: Payer: Self-pay | Admitting: Family Medicine

## 2009-04-17 LAB — CONVERTED CEMR LAB: Hgb A1c MFr Bld: 5.4 %

## 2009-04-28 ENCOUNTER — Ambulatory Visit: Payer: Self-pay | Admitting: Family Medicine

## 2009-05-15 ENCOUNTER — Ambulatory Visit: Payer: Self-pay | Admitting: Family Medicine

## 2009-05-20 ENCOUNTER — Telehealth (INDEPENDENT_AMBULATORY_CARE_PROVIDER_SITE_OTHER): Payer: Self-pay | Admitting: Family Medicine

## 2009-05-22 ENCOUNTER — Ambulatory Visit: Payer: Self-pay | Admitting: Family Medicine

## 2009-05-22 ENCOUNTER — Encounter (INDEPENDENT_AMBULATORY_CARE_PROVIDER_SITE_OTHER): Payer: Self-pay | Admitting: Family Medicine

## 2009-05-22 LAB — CONVERTED CEMR LAB
Chlamydia, DNA Probe: NEGATIVE
Whiff Test: NEGATIVE

## 2009-06-11 ENCOUNTER — Encounter (INDEPENDENT_AMBULATORY_CARE_PROVIDER_SITE_OTHER): Payer: Self-pay | Admitting: Family Medicine

## 2009-06-16 ENCOUNTER — Ambulatory Visit: Payer: Self-pay | Admitting: Family Medicine

## 2009-08-05 ENCOUNTER — Telehealth: Payer: Self-pay | Admitting: *Deleted

## 2009-10-09 ENCOUNTER — Ambulatory Visit: Payer: Self-pay | Admitting: Family Medicine

## 2009-10-16 ENCOUNTER — Ambulatory Visit: Payer: Self-pay | Admitting: Family Medicine

## 2009-10-16 LAB — CONVERTED CEMR LAB: Beta hcg, urine, semiquantitative: POSITIVE

## 2009-10-19 ENCOUNTER — Ambulatory Visit: Payer: Self-pay | Admitting: Family Medicine

## 2009-10-19 ENCOUNTER — Encounter: Payer: Self-pay | Admitting: Family Medicine

## 2009-10-20 ENCOUNTER — Encounter: Payer: Self-pay | Admitting: Family Medicine

## 2009-11-27 ENCOUNTER — Encounter: Payer: Self-pay | Admitting: Family Medicine

## 2009-11-27 ENCOUNTER — Ambulatory Visit: Payer: Self-pay | Admitting: Family Medicine

## 2009-11-27 LAB — CONVERTED CEMR LAB
Antibody Screen: NEGATIVE
Eosinophils Absolute: 0 10*3/uL (ref 0.0–0.7)
Eosinophils Relative: 1 % (ref 0–5)
Hepatitis B Surface Ag: NEGATIVE
Lymphocytes Relative: 30 % (ref 12–46)
Lymphs Abs: 2.1 10*3/uL (ref 0.7–4.0)
Sickle Cell Screen: NEGATIVE

## 2009-12-24 ENCOUNTER — Encounter: Payer: Self-pay | Admitting: Family Medicine

## 2009-12-24 ENCOUNTER — Ambulatory Visit: Payer: Self-pay | Admitting: Family Medicine

## 2009-12-24 LAB — CONVERTED CEMR LAB: GC Probe Amp, Genital: NEGATIVE

## 2009-12-28 ENCOUNTER — Ambulatory Visit: Payer: Self-pay | Admitting: Family Medicine

## 2009-12-29 ENCOUNTER — Encounter: Payer: Self-pay | Admitting: Family Medicine

## 2010-01-21 ENCOUNTER — Encounter: Payer: Self-pay | Admitting: Family Medicine

## 2010-01-21 ENCOUNTER — Ambulatory Visit: Payer: Self-pay | Admitting: Family Medicine

## 2010-01-21 LAB — CONVERTED CEMR LAB

## 2010-01-22 ENCOUNTER — Ambulatory Visit (HOSPITAL_COMMUNITY): Admission: RE | Admit: 2010-01-22 | Discharge: 2010-01-22 | Payer: Self-pay | Admitting: Family Medicine

## 2010-01-22 IMAGING — US US OB COMP +14 WK
1 series · 14 of 28 positions shown · non-contrast
Comparison: none

OBSTETRICAL ULTRASOUND:
 This ultrasound exam was performed in the [HOSPITAL] Ultrasound Department.  The OB US report was generated in the AS system, and faxed to the ordering physician.  This report is also available in [HOSPITAL]?s AccessANYware and in [REDACTED] PACS.

[Series 1: us ob comp +14 wk · 0.22mm/px · 14 of 82 slices shown]
[im 4/82]
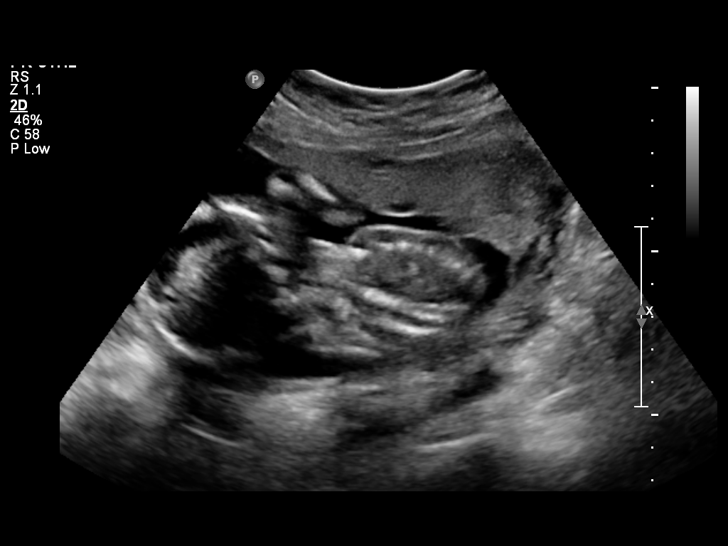
[im 10/82]
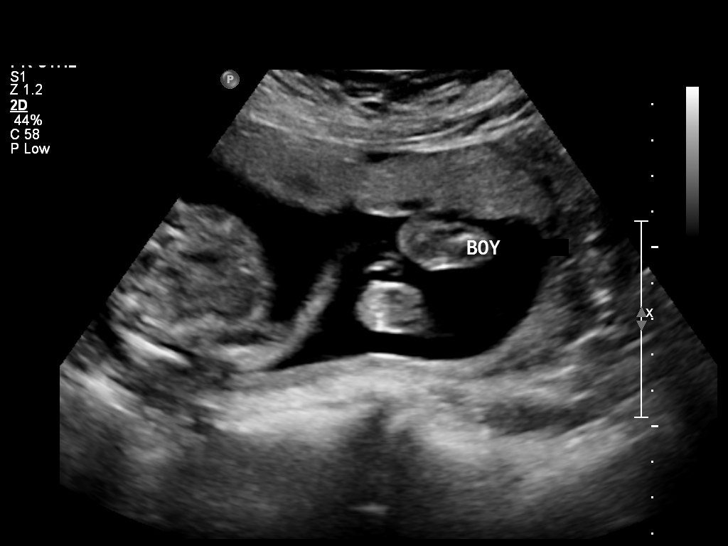
[im 16/82]
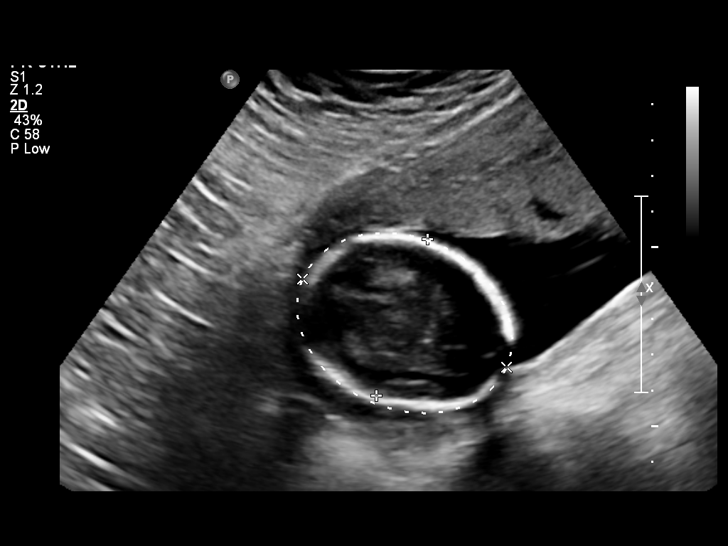
[im 22/82]
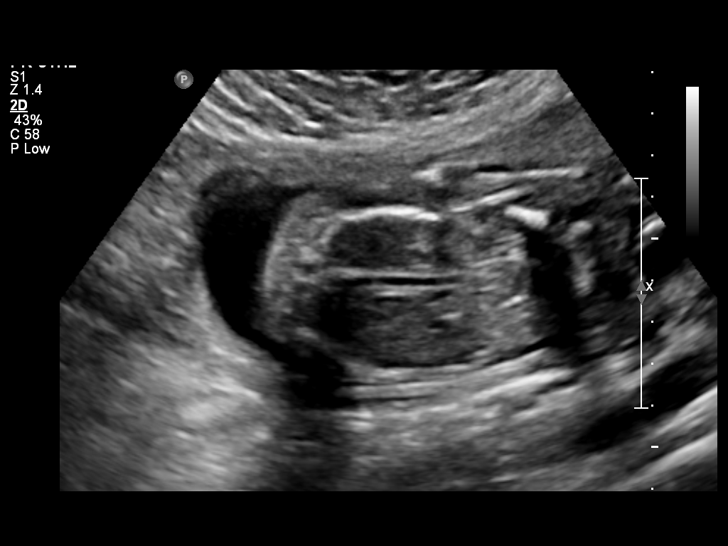
[im 28/82]
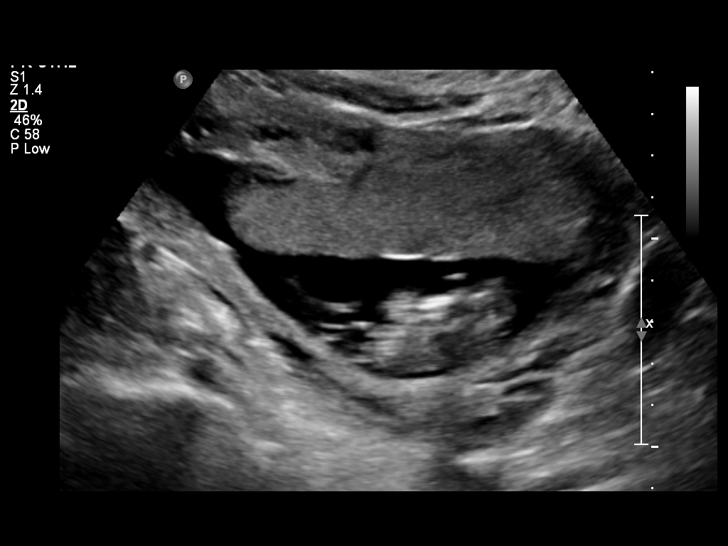
[im 34/82]
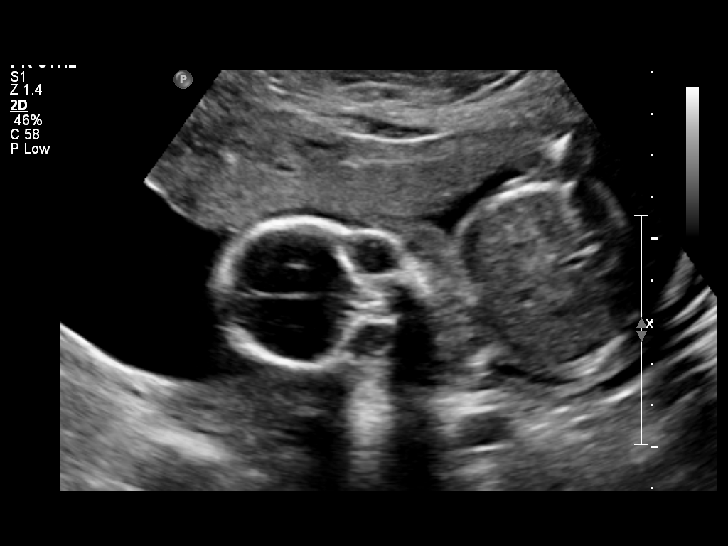
[im 40/82]
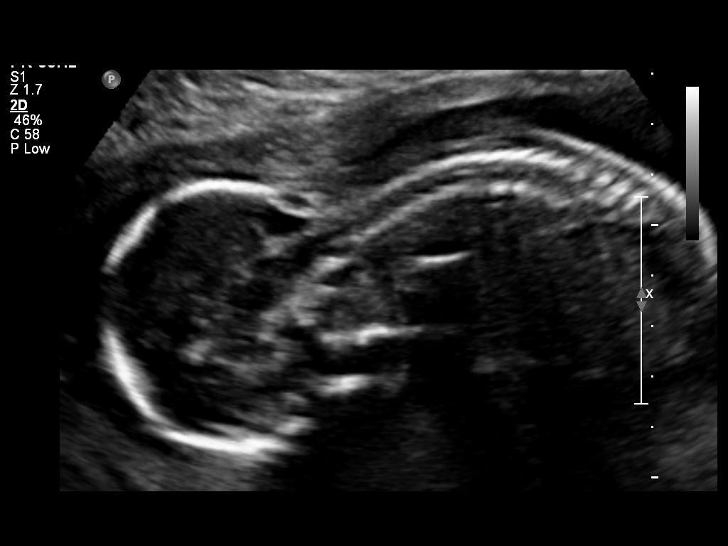
[im 46/82]
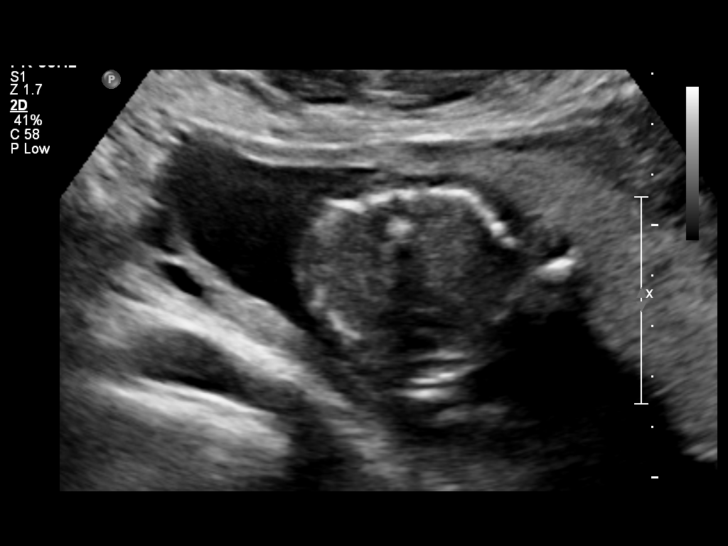
[im 52/82]
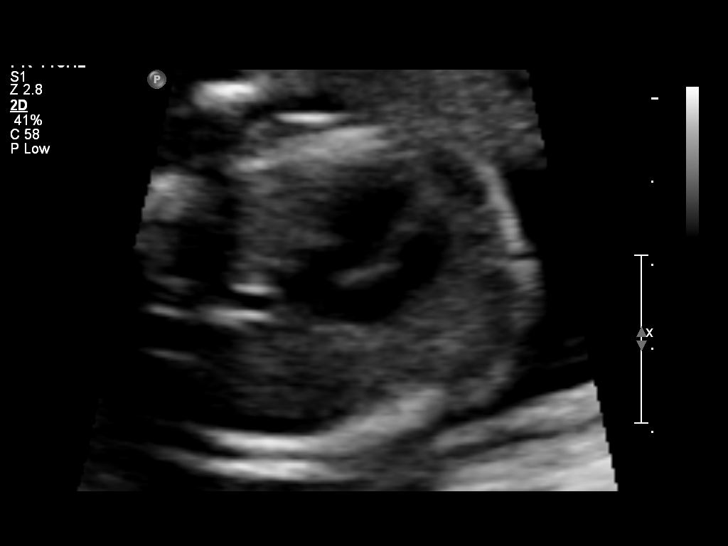
[im 58/82]
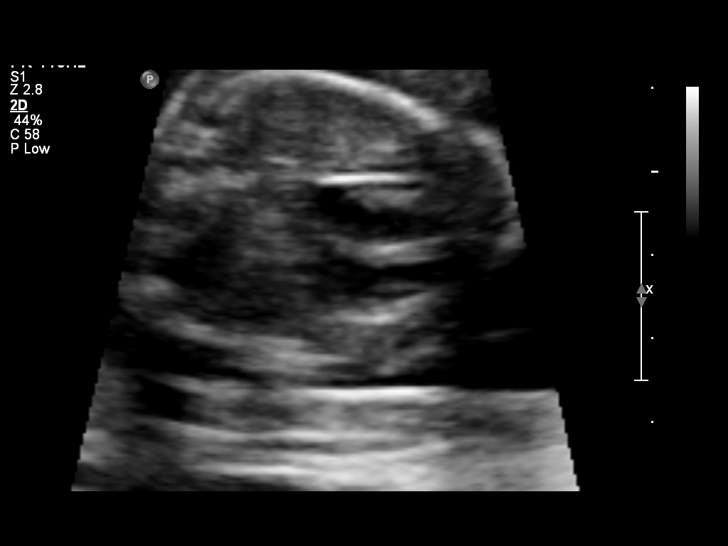
[im 64/82]
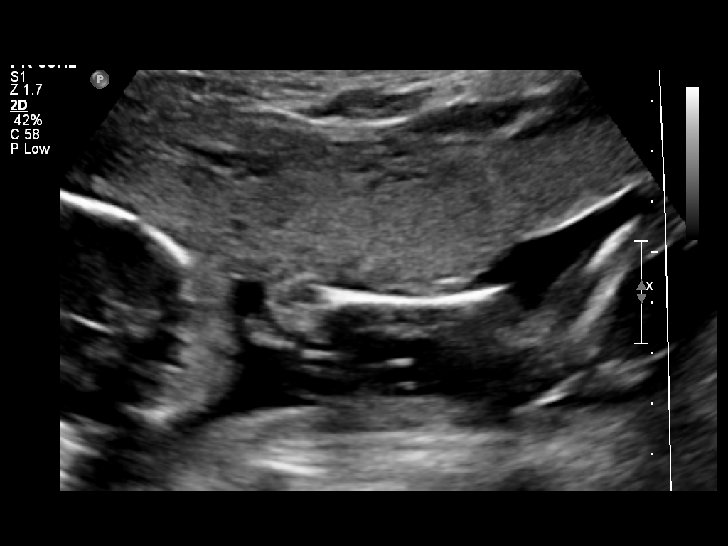
[im 70/82]
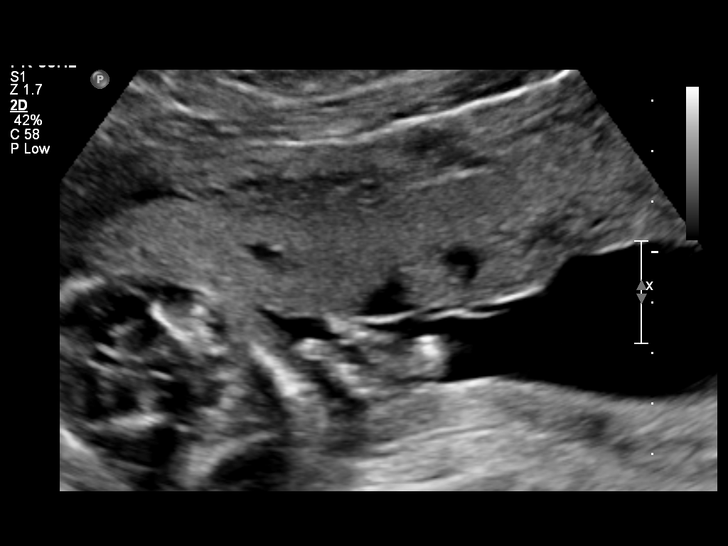
[im 76/82]
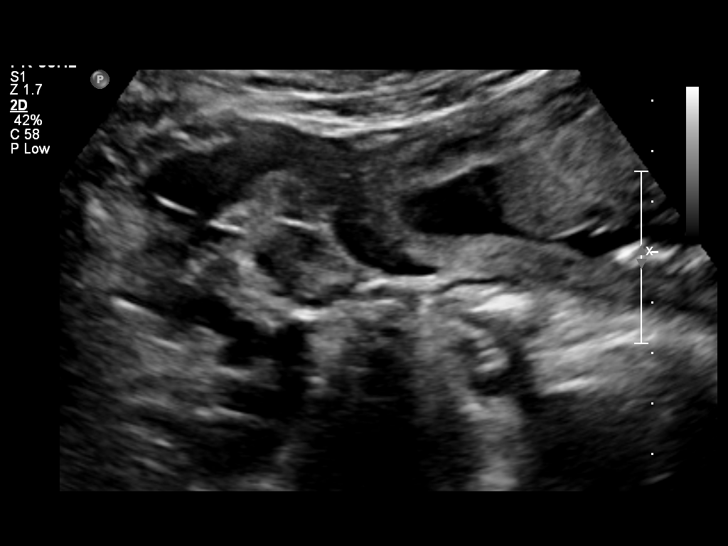
[im 82/82]
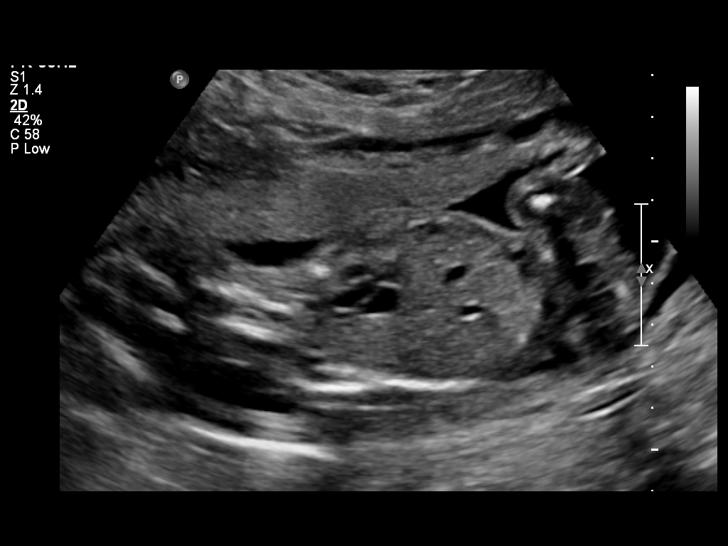

[14 of 28 positions shown; findings below may reference images not displayed]

IMPRESSION: See AS Obstetric US report.

## 2010-02-01 ENCOUNTER — Encounter: Payer: Self-pay | Admitting: Family Medicine

## 2010-02-18 ENCOUNTER — Ambulatory Visit: Payer: Self-pay | Admitting: Family Medicine

## 2010-03-18 ENCOUNTER — Telehealth: Payer: Self-pay | Admitting: Family Medicine

## 2010-03-18 ENCOUNTER — Ambulatory Visit: Payer: Self-pay | Admitting: Family Medicine

## 2010-03-18 LAB — CONVERTED CEMR LAB
HCT: 33.6 % — ABNORMAL LOW (ref 36.0–46.0)
Hemoglobin: 11 g/dL — ABNORMAL LOW (ref 12.0–15.0)
MCHC: 32.7 g/dL (ref 30.0–36.0)
RBC: 3.87 M/uL (ref 3.87–5.11)
RDW: 12.8 % (ref 11.5–15.5)
WBC: 6.9 10*3/uL (ref 4.0–10.5)

## 2010-03-19 ENCOUNTER — Ambulatory Visit: Payer: Self-pay | Admitting: Family Medicine

## 2010-03-21 ENCOUNTER — Emergency Department (HOSPITAL_COMMUNITY): Admission: EM | Admit: 2010-03-21 | Discharge: 2010-03-21 | Payer: Self-pay | Admitting: Family Medicine

## 2010-03-22 ENCOUNTER — Telehealth: Payer: Self-pay | Admitting: Family Medicine

## 2010-03-22 DIAGNOSIS — O99810 Abnormal glucose complicating pregnancy: Secondary | ICD-10-CM | POA: Insufficient documentation

## 2010-03-29 ENCOUNTER — Encounter: Admission: RE | Admit: 2010-03-29 | Discharge: 2010-03-29 | Payer: Self-pay | Admitting: Obstetrics & Gynecology

## 2010-03-29 ENCOUNTER — Ambulatory Visit: Payer: Self-pay | Admitting: Obstetrics & Gynecology

## 2010-04-05 ENCOUNTER — Ambulatory Visit: Payer: Self-pay | Admitting: Obstetrics & Gynecology

## 2010-04-05 ENCOUNTER — Encounter: Payer: Self-pay | Admitting: Family

## 2010-04-12 ENCOUNTER — Ambulatory Visit: Payer: Self-pay | Admitting: Obstetrics & Gynecology

## 2010-04-19 ENCOUNTER — Ambulatory Visit (HOSPITAL_COMMUNITY): Admission: RE | Admit: 2010-04-19 | Discharge: 2010-04-19 | Payer: Self-pay | Admitting: Family Medicine

## 2010-04-19 ENCOUNTER — Ambulatory Visit: Payer: Self-pay | Admitting: Obstetrics & Gynecology

## 2010-04-19 IMAGING — US US OB FOLLOW-UP
1 series · 14 of 28 positions shown · non-contrast
Comparison: none

OBSTETRICAL ULTRASOUND:
 This ultrasound exam was performed in the [HOSPITAL] Ultrasound Department.  The OB US report was generated in the AS system, and faxed to the ordering physician.  This report is also available in [HOSPITAL]?s [employer] and in [REDACTED] PACS.

[Series 1: us ob follow up · 0.36mm/px · 14 of 35 slices shown]
[im 2/35]
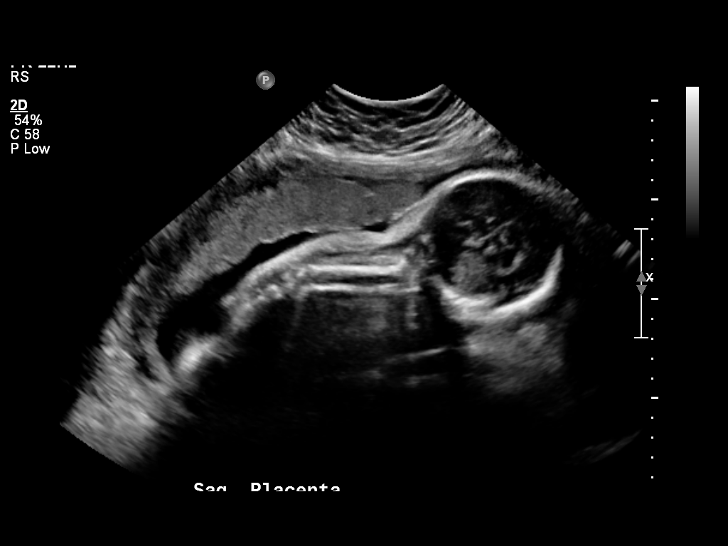
[im 4/35]
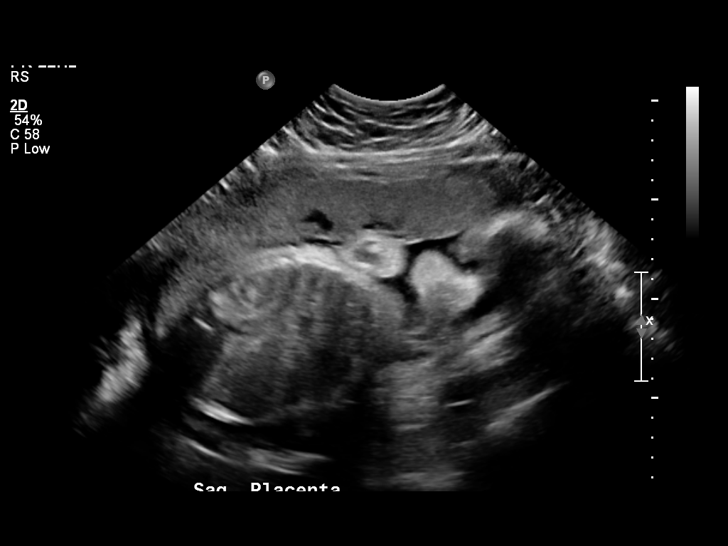
[im 7/35]
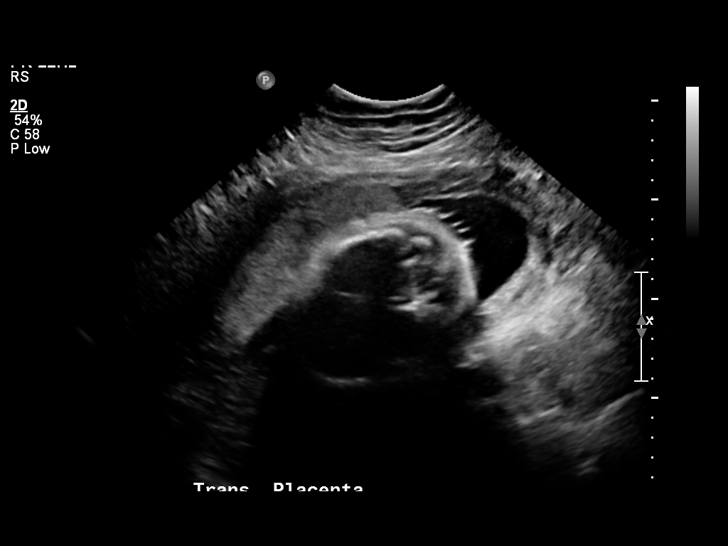
[im 9/35]
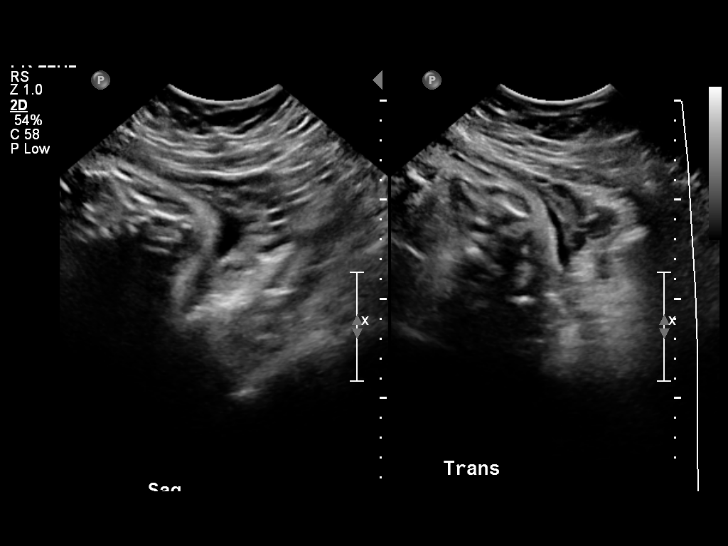
[im 12/35]
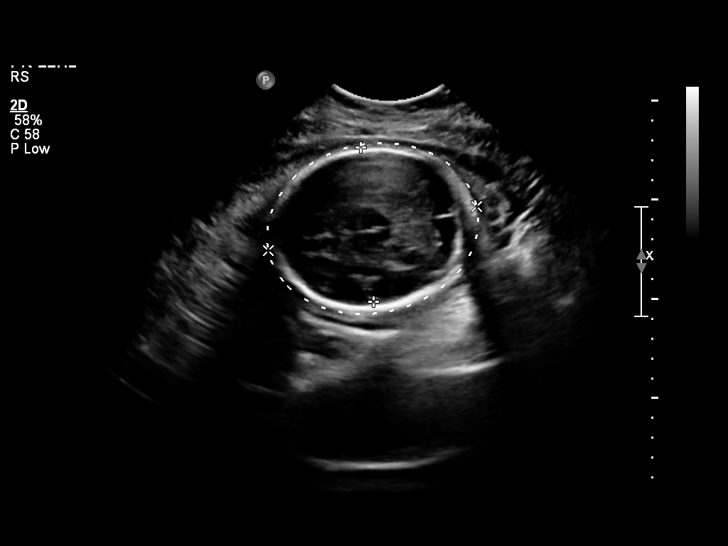
[im 14/35]
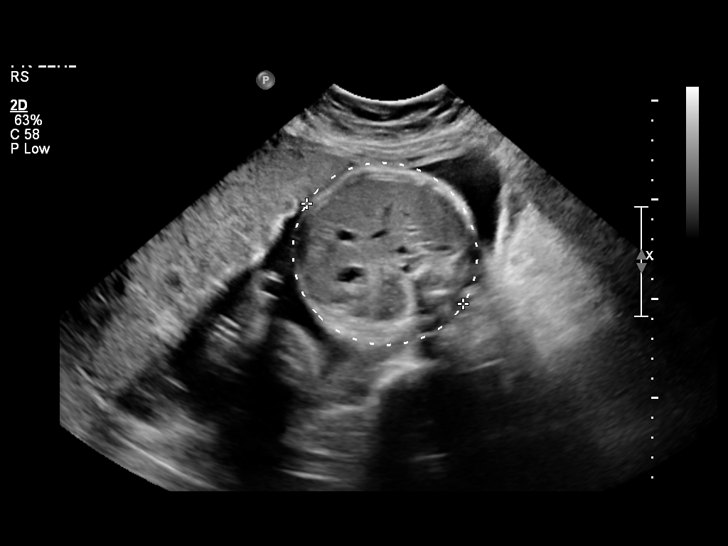
[im 17/35]
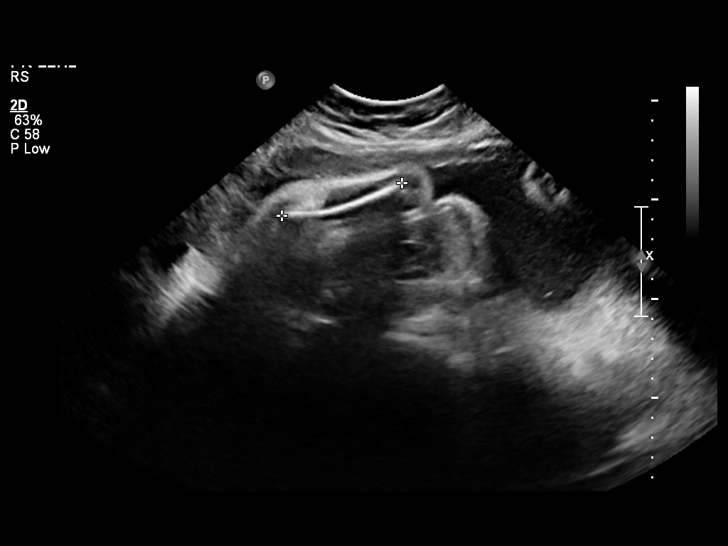
[im 19/35]
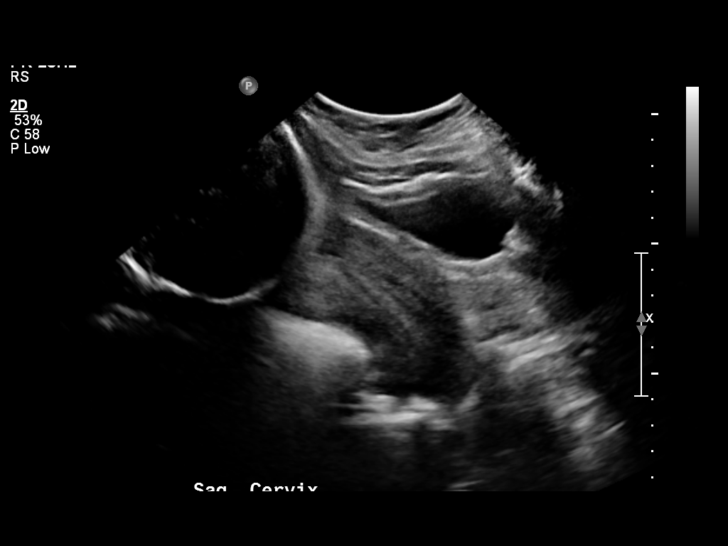
[im 22/35]
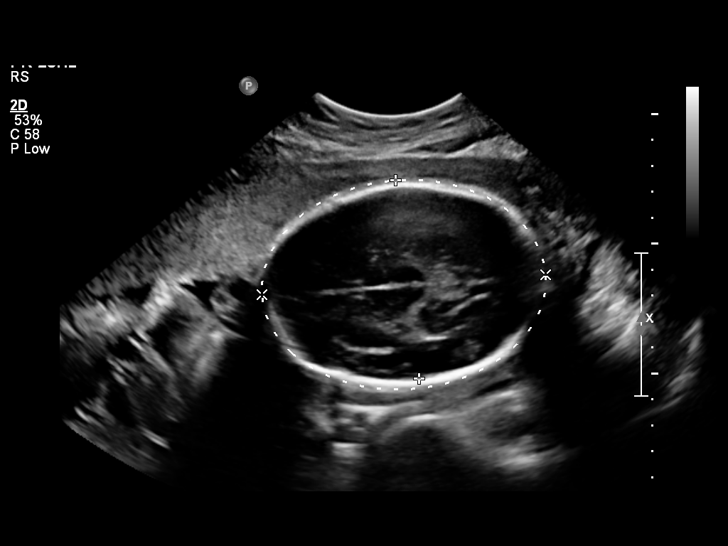
[im 24/35]
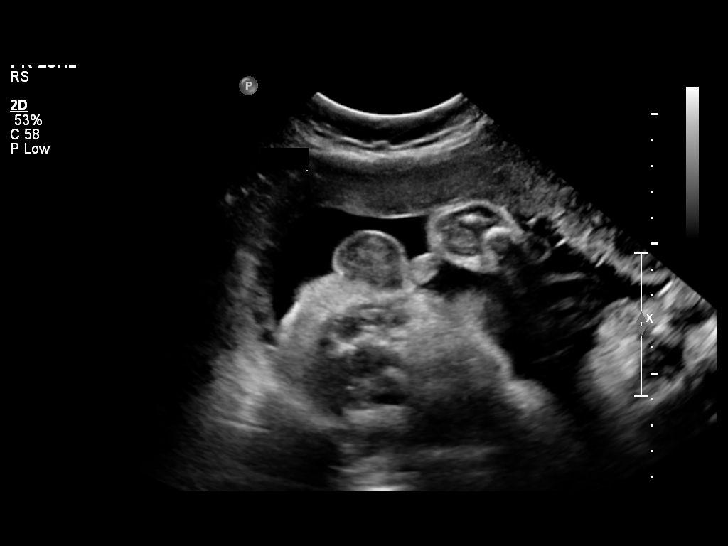
[im 27/35]
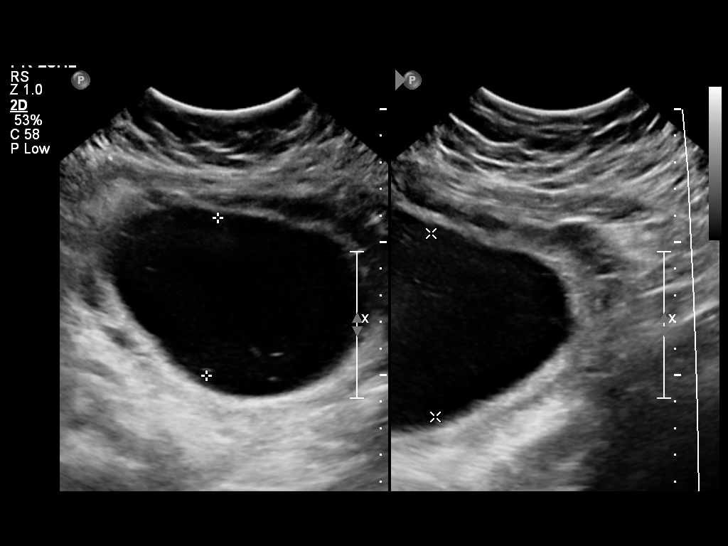
[im 29/35]
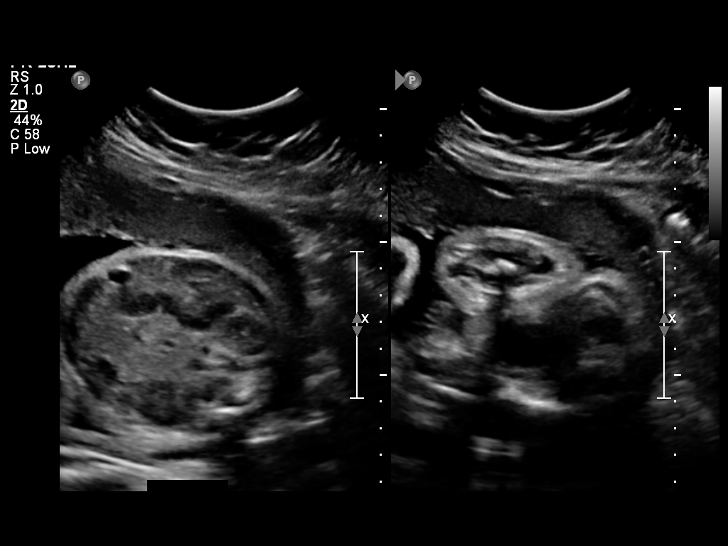
[im 32/35]
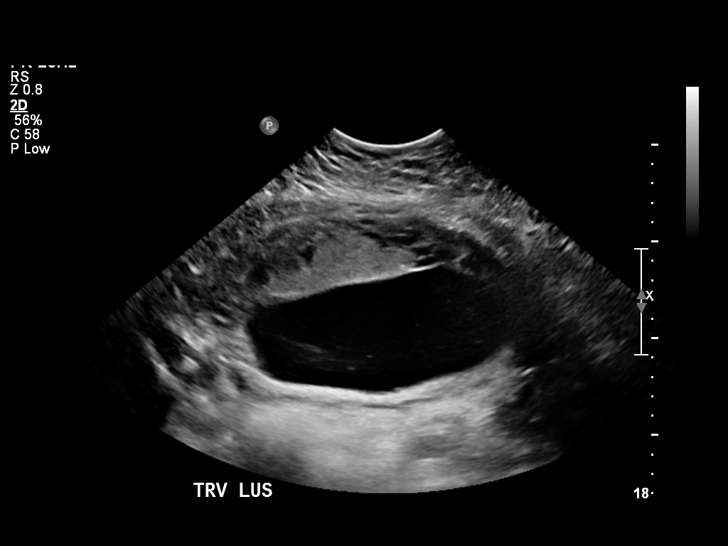
[im 35/35]
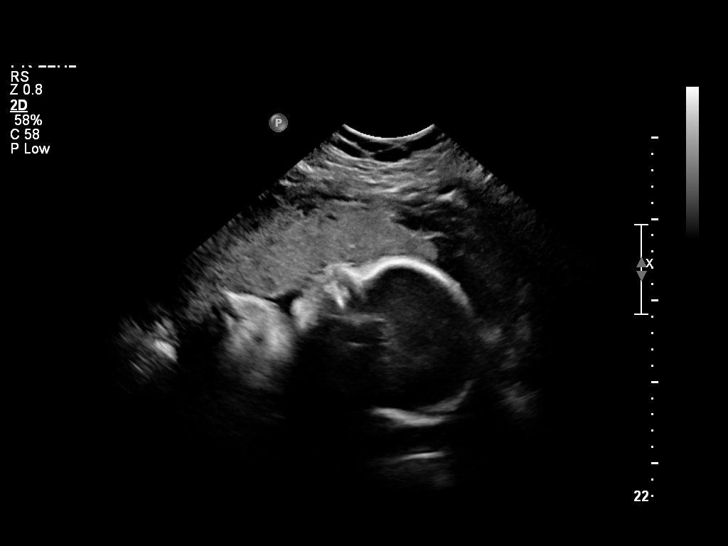

[14 of 28 positions shown; findings below may reference images not displayed]

IMPRESSION: See AS Obstetric US report.

## 2010-05-03 ENCOUNTER — Ambulatory Visit: Payer: Self-pay | Admitting: Obstetrics & Gynecology

## 2010-05-17 ENCOUNTER — Ambulatory Visit: Payer: Self-pay | Admitting: Obstetrics & Gynecology

## 2010-05-27 ENCOUNTER — Ambulatory Visit: Payer: Self-pay | Admitting: Obstetrics & Gynecology

## 2010-05-28 ENCOUNTER — Ambulatory Visit (HOSPITAL_COMMUNITY): Admission: RE | Admit: 2010-05-28 | Discharge: 2010-05-28 | Payer: Self-pay | Admitting: Obstetrics & Gynecology

## 2010-05-31 ENCOUNTER — Ambulatory Visit: Payer: Self-pay | Admitting: Obstetrics & Gynecology

## 2010-06-07 ENCOUNTER — Ambulatory Visit (HOSPITAL_COMMUNITY): Admission: RE | Admit: 2010-06-07 | Discharge: 2010-06-07 | Payer: Self-pay | Admitting: Family Medicine

## 2010-06-07 ENCOUNTER — Ambulatory Visit: Payer: Self-pay | Admitting: Obstetrics & Gynecology

## 2010-06-07 IMAGING — US US OB FOLLOW-UP
1 series · 14 of 19 positions shown · non-contrast
Comparison: none

OBSTETRICAL ULTRASOUND:
 This ultrasound exam was performed in the [HOSPITAL] Ultrasound Department.  The OB US report was generated in the AS system, and faxed to the ordering physician.  This report is also available in [HOSPITAL]?s Access[employer] and in [REDACTED] PACS.

[Series 1: us ob follow up · 0.36mm/px · 14 of 19 slices shown]
[im 1/19]
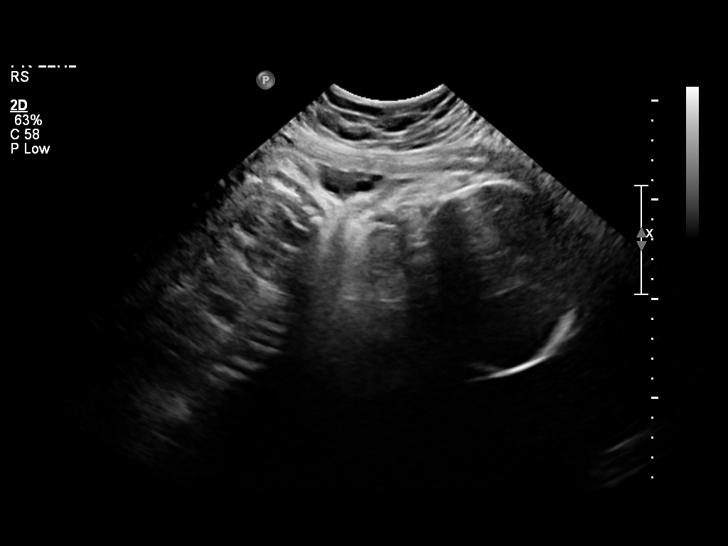
[im 3/19]
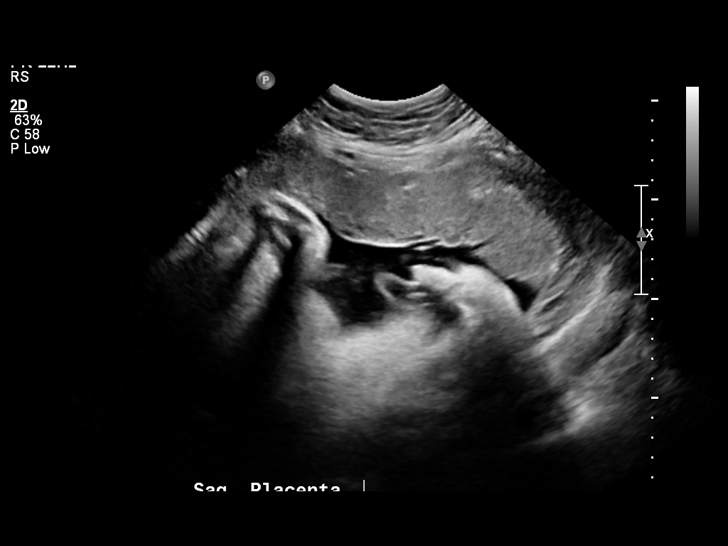
[im 4/19]
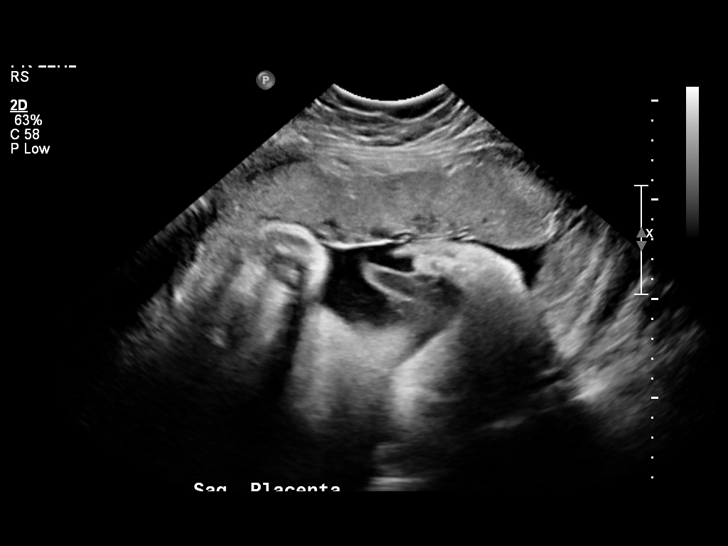
[im 5/19]
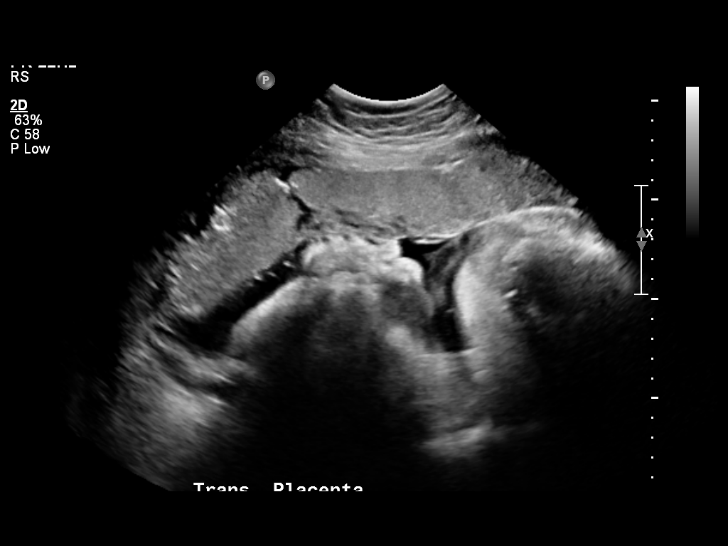
[im 7/19]
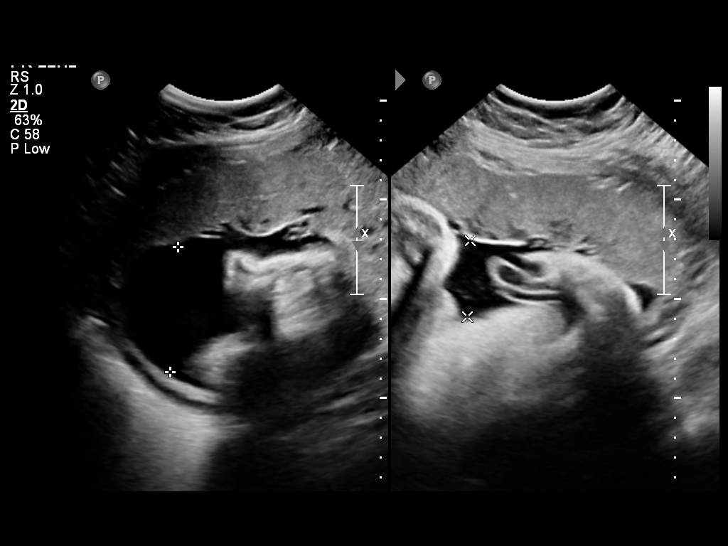
[im 8/19]
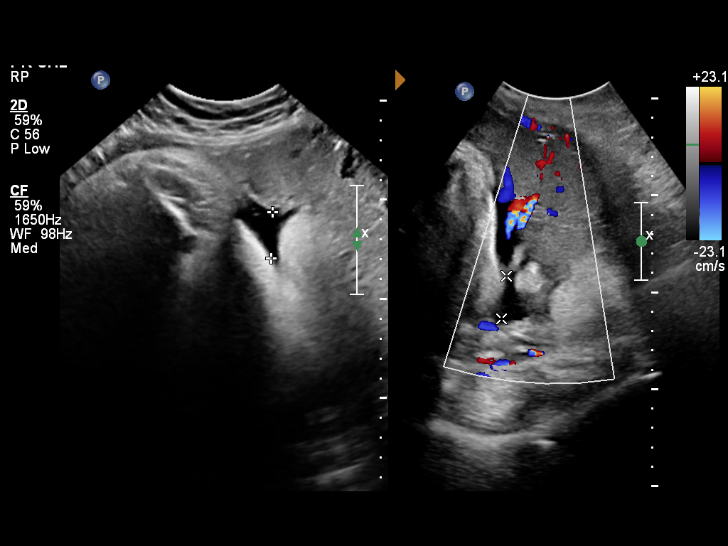
[im 9/19]
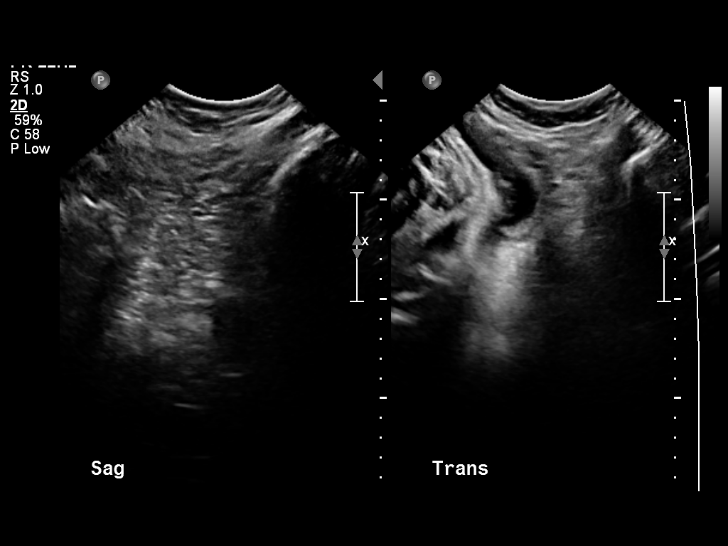
[im 11/19]
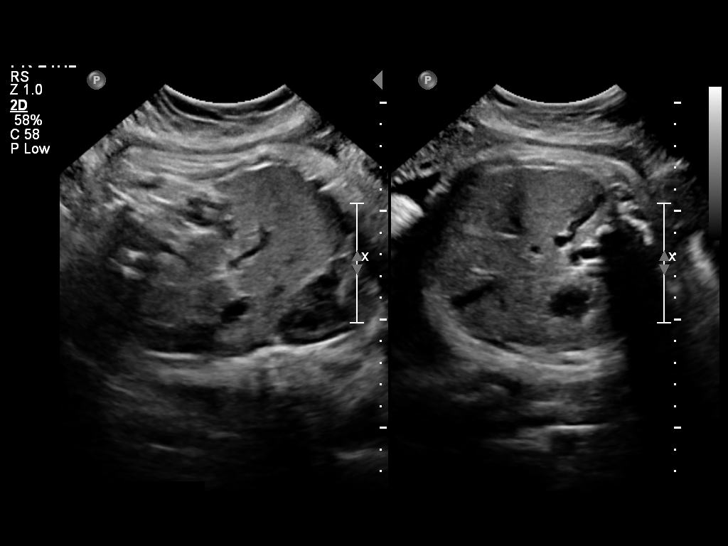
[im 12/19]
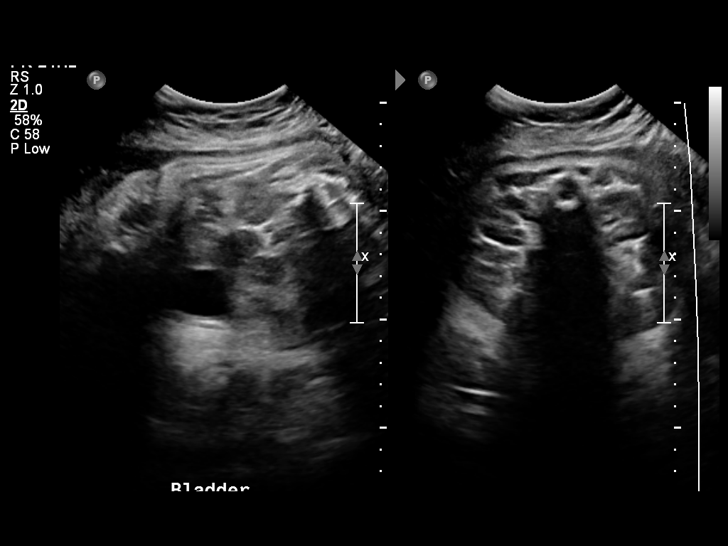
[im 13/19]
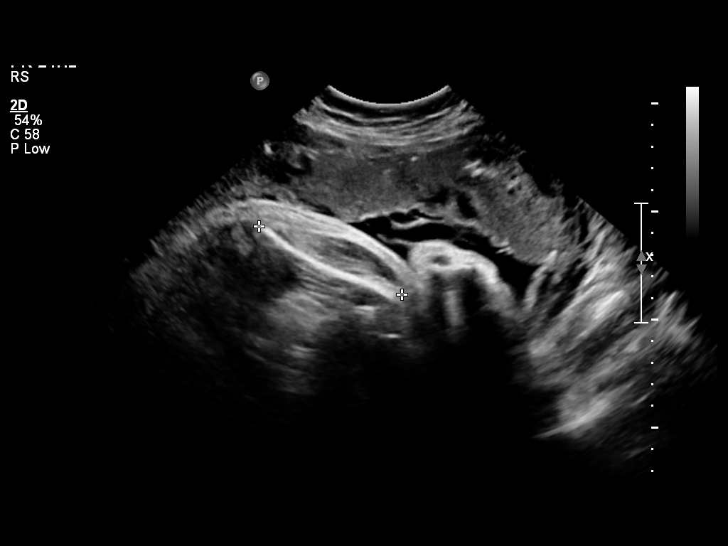
[im 15/19]
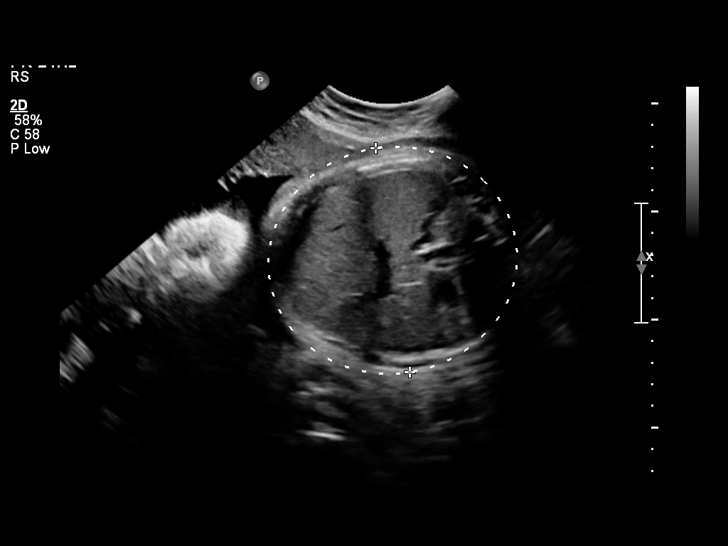
[im 16/19]
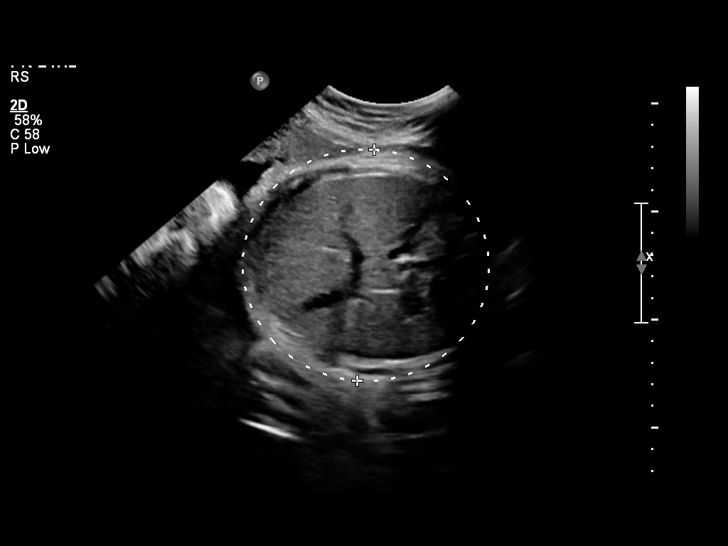
[im 17/19]
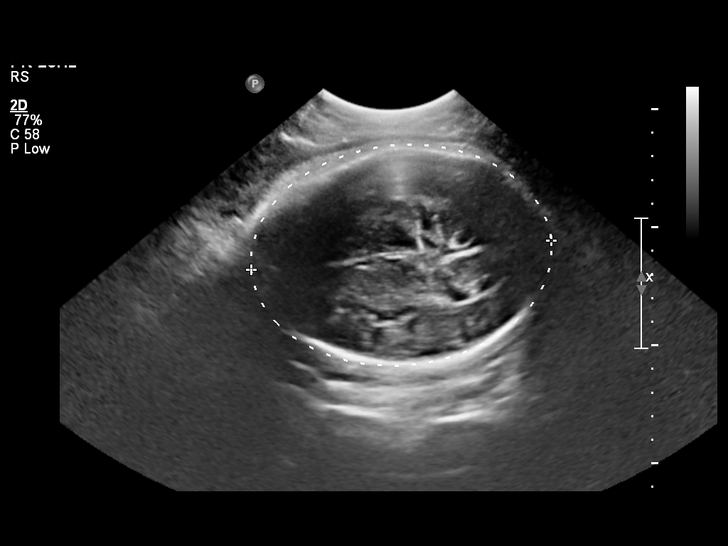
[im 19/19]
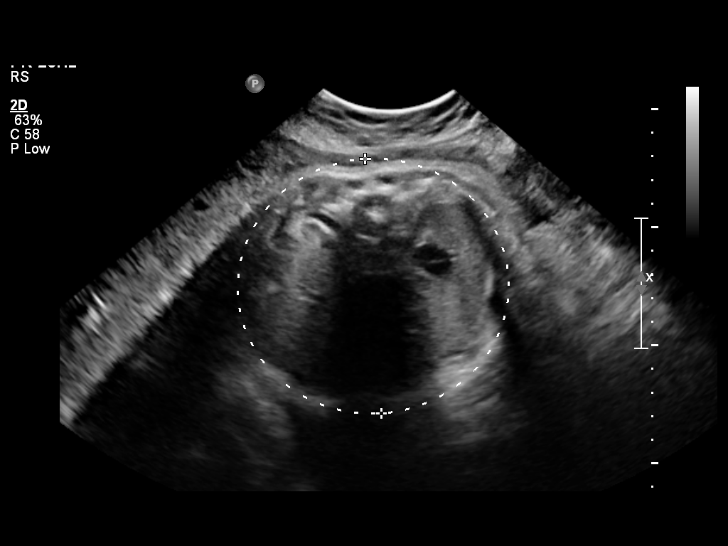

[14 of 19 positions shown; findings below may reference images not displayed]

IMPRESSION: See AS Obstetric US report.

## 2010-06-14 ENCOUNTER — Ambulatory Visit: Payer: Self-pay | Admitting: Obstetrics & Gynecology

## 2010-06-17 ENCOUNTER — Inpatient Hospital Stay (HOSPITAL_COMMUNITY): Admission: AD | Admit: 2010-06-17 | Discharge: 2010-06-19 | Payer: Self-pay | Admitting: Obstetrics & Gynecology

## 2010-06-17 ENCOUNTER — Ambulatory Visit: Payer: Self-pay | Admitting: Advanced Practice Midwife

## 2010-06-18 ENCOUNTER — Encounter: Payer: Self-pay | Admitting: Obstetrics & Gynecology

## 2010-11-30 ENCOUNTER — Encounter: Payer: Self-pay | Admitting: Family Medicine

## 2011-01-25 NOTE — Assessment & Plan Note (Signed)
Summary: OB VISIT,MC   Vital Signs:  Patient profile:   35 year old female LMP:     09/10/2009 Weight:      176.2 pounds Temp:     98.1 degrees F oral Pulse rate:   84 / minute Pulse rhythm:   regular BP sitting:   106 / 65  (left arm) Cuff size:   regular  Vitals Entered By: Loralee Pacas CMA (January 21, 2010 9:40 AM)  Primary Care Provider:  Levander Campion MD  CC:  ROB.  History of Present Illness:  Pt has no concerns today. Complete medical history taken as previous did not input into the system.   Habits & Providers  Alcohol-Tobacco-Diet     Cigarette Packs/Day: n/a  Current Medications (verified): 1)  Stuart Prenatal  Tabs (Prenatal Vit-Fe Fumarate-Fa) .... 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 [id number]  at 19 weeks EDD 06/17/10 via LMP , AB+, antibody neg, HbSag-neg, RPR-neg, HIV-NEG, GC/Chlamydia-neg, plan for [place] for anatomy and dating tomorrow, pt declined QUAD screen. 3hr GTT passed Note pt with history of VBAC however will need to be sent to [facility] High risk for Trial of Labor consult before third trimester Pt to re-establish [place] Progressing well, no red flags. RTC 4 weeks Plan VBAC Breast  Orders: Other OB visit- [facility] ([facility])  Complete Medication List: 1)  Stuart Prenatal Tabs (Prenatal vit-fe fumarate-fa) .... Sig take 1 tab by mouth daily instructions in spanish generic pnv that is least expensive  Patient Instructions: 1)  Congratulations on your new  pregnancy! 2)  If you have any concerns please feel free to call. 3)  If you have severe abominal pain or bleeding please go to [facility] to be evaluated 4)  Please take your vitamins as prescribed and drink plenty of water 5)  Your next visit is in 4 weeks with Dr. Edmonia James 6)  Your ultrasound is scheduled for tomorrow.  Prenatal Visit    FOB name: [facility] Confirmation:    New working [facility]: 06/17/2010    LMP reliable? Yes    Last menses onset (LMP) date: 09/10/2009    EDC by LMP: 06/17/2010     Flowsheet View for Follow-up Visit    Estimated weeks of       gestation:     19 0/7    Weight:     176.2    Blood pressure:   106 / 65    Hx headache?     No    Nausea/vomiting?   No    Edema?     0    Bleeding?     no    Leakage/discharge?   no    Fetal activity:       yes- minimal    Labor symptoms?   no    Fundal height:      below umbilicus 1-2cm    FHR:       140    Fetal position:      N/A    Taking Vitamins?   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 : Yes    Menarche: 13 years    Menses interval: 28 days    Menstrual flow 4 days    On BCP's at conception: no    Pre Pregnancy Weight: 172 lbs.    Symptoms since LMP: amenorrhea, nausea   Past Pregnancy History    Gravida:     5    Term Births:      3    Living Children:   3    Para:       4    Spont. Ab:     1  Pregnancy # 1    Delivery date:     [birth year]16[birth year]1999    Weeks Gestation:   40    Delivery type:     NSVD    Delivery location:     [address]    Infant Sex:     Female    Birth weight:     8lb 15oz    Name:     Esau    Comments:     no complications  Pregnancy # 2    Delivery date:     02/01/2001    Weeks Gestation:   42    Delivery type:     C-section    Delivery location:     [facility]    Infant Sex:     Female    Birth weight:     8.13    Name:     Lenord Fellers    Comments:     C section for Breech   Pregnancy # 3    Delivery date:     02/25/2004    Comments:     Misscariage at 10weeksm told ?Ectopic pregnancy  Pregnancy # 4    Delivery date:     04/16/2005    Weeks Gestation:   41    Delivery type:     NSVD    Delivery location:     [facility]    Infant Sex:     Female    Birth weight:     8. 15    Name:     Horacio    Comments:     no complications   Pregnancy # 5    Comments:     Current   Genetic History    Father of baby:   Norval Gable     Thalassemia:     mother: no   father: no    Neural tube defect:   mother: no   father: no  Down's Syndrome:   mother: no   father: no    Tay-Sachs:     mother: no   father: no    Sickle Cell Dz/Trait:   mother: no   father: no    Hemophilia:     mother: no   father: no    Muscular Dystrophy:   mother: no   father: no    Cystic Fibrosis:   mother: no   father: no    Huntington's Dz:   mother: no   father: no    Mental Retardation:   mother: no   father: no    Fragile X:     mother: no   father: no    Other Genetic or       Chromosomal Dz:   mother: no   father: no    Child with other       birth defect:     mother: no   father: no    > 3 spont. abortions:   mother: no    Hx of stillbirth:     mother: no  Infection Risk History    High Risk Hepatitis B: no    Immunized against Hepatitis B: yes    Exposure to TB: yes    Patient with history of Genital Herpes: no     Sexual partner with history of Genital Herpes: no    History of STD (GC, Chlamydia, Syphilis, HPV): no    Rash, Viral, or Febrile Illness since LMP: no    Exposure to Cat Litter: no    Chicken Pox Immune Status: Hx of Disease: Immune    History of Parvovirus (Fifth Disease): no    Occupational Exposure to Children: none  Environmental Exposures    Xray Exposure since LMP: no    Chemical or other exposure: no    Medication, drug, or alcohol use since LMP: no  Additional Infection/Environmental Comments:    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   Y    Smoking:     n/a    Next visit:     4 wk    Resident:     Jeanice Lim    Preceptor:     [place]  Appended Document: Kinnie Scales I saw Ms. Ortiz-Ortiz with Dr. Jeanice Lim and agree with her plan and note.  Pt will follow up with Dr. Edmonia James.

## 2011-01-25 NOTE — Progress Notes (Signed)
      New Problems: UTI (ICD-599.0) DIABETES MELLITUS, GESTATIONAL (ICD-648.80)   New Problems: UTI (ICD-599.0) DIABETES MELLITUS, GESTATIONAL (ICD-648.80) Called patient at [phone] to inform of 3-hr GTT results. Conversation conducted in [name].  I informed her of abnormal test, need for High Risk OB referral at [facility] for GDM.    Carolyn Mendez tells me she started with dysuria on Saturday March 26, was seen in [facility] and started on nitrofurantoin 100mg  two times a day.  Has less dysuria today.  No subjective fevers.   I notified her that we will be calling her with High Risk appt information.   She will need follow up Urine Culture as TOC. Paula Compton MD  March 22, 2010 11:52 AM

## 2011-01-25 NOTE — Miscellaneous (Signed)
   Clinical Lists Changes  Problems: Removed problem of UTI (ICD-599.0) Removed problem of PREVIOUS C-SECT DELIVERY ANTPRTM COND/COMP (LOV-564.33) Removed problem of ABNORMAL MATERNAL GLUCOSE TOLERANCE ANTEPARTUM (IRJ-188.41) Removed problem of PREGNANT STATE, INCIDENTAL (ICD-V22.2) Removed problem of AMENORRHEA (ICD-626.0) Removed problem of NEED PROPHYLACTIC VACCINATION&INOCULATION FLU (ICD-V04.81) Removed problem of CONTRACEPTIVE MANAGEMENT (ICD-V25.09) Removed problem of IMPAIRED FASTING GLUCOSE (ICD-790.21) Removed problem of History of  GESTATIONAL DIABETES (ICD-648.80) Removed problem of INSERTION OF INTRAUTERINE CONTRACEPTIVE DEVICE (ICD-V25.1) Removed problem of ROUTINE GYNECOLOGICAL EXAMINATION (ICD-V72.31) Removed problem of SCREENING FOR MALIGNANT NEOPLASM OF THE CERVIX (ICD-V76.2)      Allergies: No Known Drug Allergies

## 2011-01-25 NOTE — Letter (Signed)
Summary: Out of Work  [facility]  [address]   [place], [place] [address]   Phone: [phone]  Fax: [phone]    March 18, 2010   Employee:  Carolyn Mendez    To Whom It May Concern:   Mrs. Carolyn Mendez was seen today in our office for a medical visit.  For medical reasons, it is important that she be permitted to have various breaks of sufficient duration to permit her to eat small meals, every 2 to 3 hours, during her workday.  This accommodation will have a significant positive impact on her present medical condition.   If you need additional information, please feel free to contact our office.         Sincerely,    Paula Compton MD

## 2011-01-25 NOTE — Assessment & Plan Note (Signed)
Summary: OB/KH   Vital Signs:  Patient profile:   35 year old female Height:      62 inches Weight:      186.7 pounds BMI:     34.27 Temp:     98.4 degrees F oral Pulse rate:   90 / minute BP sitting:   113 / 66  (left arm) Cuff size:   regular  Vitals Entered By: Gladstone Pih (March 18, 2010 9:24 AM) CC: OB 27  0/7 Is Patient Diabetic? No Pain Assessment Patient in pain? no        CC:  OB 27  0/7.  Habits & Providers  Alcohol-Tobacco-Diet     Tobacco Status: never     Cigarette Packs/Day: n/a  Allergies: No Known Drug Allergies   Impression & Recommendations:  Problem # 1:  PREGNANT STATE, INCIDENTAL (ICD-V22.2)  Visit conducted in Spanish, in [facility].  [id number] seen today at 27 0/[redacted] weeks EGA confirmed by [place] at 19 weeks.  In reviewing chart, patient failed early 1hrGTT and passed 3hrGTT.  Will repeat 1hrGTT today, also to do 28 week labs today.  Discussed reflux, whcih is getting worse.  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  Letter to permit her to take breaks at work to eat small meals.  Calcium carbonate, avoidance of carbonation and greasy foods/caffeine.  For followup in 2 weeks.  Orders: Other OB visit- [facility]) CBC-[facility] (73710) HIV-[facility] ([id number]) RPR-[facility] [phone]) Glucose 1 hr-[facility] (82993)  Complete Medication List: 1)  Stuart Prenatal Tabs (Prenatal vit-fe fumarate-fa) .... Sig take 1 tab by mouth daily instructions in spanish generic pnv that is least expensive  Patient Instructions: 1)  Fue un placer verle hoy en la [facility]! Todo parece estar bien con Ud y su bebe hoy.  2)  Estamos repitiendo las pruebas de la [place], VIH y el conteo de [place].  3)  Para la [place], puede tomar TUMS (carbonato de calcio), tratar de evitar comidas grasosas, gaseosas, y cafeina.  Lo mejor es comer varias comidas pequenas en vez de comer tres comidas fuertes.  4)  Para su hija Letha [place] Arvilla Meres, 02/01/2001), si ella tiene Medicaid,  le recomiendo que llame a Medicaid para una lista de oculistas que acepten a pacientes con [place].  5)  FOLLOW UP OB APPT IN 2 WEEKS.     Flowsheet View for Follow-up Visit    Estimated weeks of       gestation:     27 0/7    Weight:     186.7    Blood pressure:   113 / 66    Headache:     No    Nausea/vomiting:   No    Edema:     0    Vaginal bleeding:   no    Vaginal discharge:   no    Fundal height:      28.5    FHR:       145    Fetal activity:     yes    Labor symptoms:   no    Fetal position:     N/A    Taking prenatal vits?   Y    Smoking:     n/a    Next visit:     2 wk    Preceptor:     Montel [place] for Follow-up Visit    Estimated weeks of  gestation:     27 0/7    Weight:     186.7    Blood pressure:   113 / 66    Hx headache?     No    Nausea/vomiting?   No    Edema?     0    Bleeding?     no    Leakage/discharge?   no    Fetal activity:       yes    Labor symptoms?   no    Fundal height:      28.5    FHR:       145    Fetal position:      N/A    Taking Vitamins?   Y    Smoking PPD:   n/a    Next visit:     2 wk    Preceptor:     Mauricio Po  Appended Document: OB/KH     Clinical Lists Changes  Problems: Added new problem of PREVIOUS C-SECT DELIVERY ANTPRTM COND/COMP (UEA-540.98) Orders: Added new Referral order of Obstetric Referral (Obstetric) - Signed

## 2011-01-25 NOTE — Miscellaneous (Signed)
Summary: OB ultrasound orders   Clinical Lists Changes  Orders: Added new Test order of Prenatal U/S > 14 weeks - [address]109 (Prenatal U/S) - Signed

## 2011-01-25 NOTE — Letter (Signed)
Summary: Generic Letter  [facility]  [address]   [place], [place] [address]   Phone: [phone]  Fax: [phone]    01/21/2010  SHIREEN RAYBURN [address] [place], [place]  [address]    To Whom this May Concern:      Mrs. Staunton is under the care of [facility] for her current pregnancy. We advise that she not lift anything over 15 pounds to avoid any complications that may arise. If you have any questions please feel free to call.       Sincerely,   Milinda Antis MD

## 2011-01-25 NOTE — Assessment & Plan Note (Signed)
Summary: ob visit- 23 weeks   Vital Signs:  Patient profile:   35 year old female Weight:      183 pounds BP sitting:   101 / 65  Vitals Entered By: Arlyss Repress CMA, (February 18, 2010 3:18 PM)  Primary Care Provider:  Ellin Mayhew MD   History of Present Illness: Pt states that she has no complaints or conerns about her pregnancy today.  Pt's 46 year old daughter is present with her at today's visit.  Habits & Providers  Alcohol-Tobacco-Diet     Cigarette Packs/Day: n/a  Current Medications (verified): 1)  Stuart Prenatal  Tabs (Prenatal Vit-Fe Fumarate-Fa) .... Sig Take 1 Tab By Mouth Daily Instructions in Spanish Generic Pnv That Is Least Expensive  Allergies (verified): No Known Drug Allergies   Impression & Recommendations:  Problem # 1:  PREGNANT STATE, INCIDENTAL (ICD-V22.2) 35 y.o. [id number]  at 23 weeks EDD 06/17/10 via LMP ( EDC confirmed by 19 week u/s), AB+, antibody neg, HbSag-neg, RPR-neg, HIV-NEG, GC/Chlamydia-neg, pt declined QUAD screen at last visit. 3hr GTT passed.  U/S at 19 weeks  indicates infant is female.  pt with history of VBAC however will need to be sent to [facility] High risk for Trial of Labor consult before third trimester At next visit ensure that pt has re-established Deboroh Hill Progressing well, no red flags. RTC 4 weeks at [facility]. Pt plans on having VBAC and Breast feeding.  Orders: Other OB visit- [facility] ([facility])  Complete Medication List: 1)  Stuart Prenatal Tabs (Prenatal vit-fe fumarate-fa) .... 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- [place]    Number of children at home: 3    Hospital of delivery: [facility]    Newborn's physician: [facility]  FOB  Information    Husband/Father of baby: Norval Gable    FOB occupation Holiday representative    Phone: [phone]  Menstrual History    LMP (date): 09/10/2009    LMP - Character: normal    Menarche: 13 years    Menses interval: 28 days    Menstrual flow 4 days    On BCP's at conception: no   Flowsheet View for Follow-up Visit    Estimated weeks of       gestation:     23 0/7    Weight:     183    Blood pressure:   101 / 65    Headache:     No    Nausea/vomiting:   No    Edema:     0    Vaginal bleeding:   no    Vaginal discharge:   no    Fundal height:      23    FHR:       140    Fetal activity:     yes    Labor symptoms:   no    Fetal position:     N/A    Taking prenatal vits?   Y    Smoking:     n/a    Next visit:     4 wk    Resident:     Edmonia James  Preceptor:     Montel Culver [place] for Follow-up Visit    Estimated weeks of       gestation:     23 0/7    Weight:     183    Blood pressure:   101 / 65    Hx headache?     No    Nausea/vomiting?   No    Edema?     0    Bleeding?     no    Leakage/discharge?   no    Fetal activity:       yes    Labor symptoms?   no    Fundal height:      23    FHR:       140    Fetal position:      N/A    Taking Vitamins?   Y    Smoking PPD:   n/a    Next visit:     4 wk    Resident:     Edmonia James    Preceptor:     Mauricio Po

## 2011-01-25 NOTE — Progress Notes (Signed)
   Call completed in Spanish.  Called patient at [phone] to discuss elevated 1hr GTT today.  She is coming in tomorrow morning for the 3hr GTT>  She knows to come in fasting in the morning.  Paula Compton MD  March 18, 2010 1:48 PM

## 2011-02-01 ENCOUNTER — Encounter: Payer: Self-pay | Admitting: *Deleted

## 2011-03-13 LAB — POCT URINALYSIS DIP (DEVICE)
Bilirubin Urine: NEGATIVE
Glucose, UA: NEGATIVE mg/dL
Nitrite: NEGATIVE
pH: 7.5 (ref 5.0–8.0)

## 2011-03-13 LAB — GLUCOSE, CAPILLARY
Glucose-Capillary: 102 mg/dL — ABNORMAL HIGH (ref 70–99)
Glucose-Capillary: 97 mg/dL (ref 70–99)

## 2011-03-13 LAB — CBC
HCT: 25.2 % — ABNORMAL LOW (ref 36.0–46.0)
HCT: 34.3 % — ABNORMAL LOW (ref 36.0–46.0)
Hemoglobin: 11.1 g/dL — ABNORMAL LOW (ref 12.0–15.0)
Hemoglobin: 8.6 g/dL — ABNORMAL LOW (ref 12.0–15.0)
MCH: 27.6 pg (ref 26.0–34.0)
MCHC: 32.3 g/dL (ref 30.0–36.0)
MCHC: 34.3 g/dL (ref 30.0–36.0)
RBC: 2.97 MIL/uL — ABNORMAL LOW (ref 3.87–5.11)
RBC: 4.02 MIL/uL (ref 3.87–5.11)
WBC: 7.7 10*3/uL (ref 4.0–10.5)

## 2011-03-14 LAB — POCT URINALYSIS DIP (DEVICE)
Bilirubin Urine: NEGATIVE
Bilirubin Urine: NEGATIVE
Bilirubin Urine: NEGATIVE
Glucose, UA: NEGATIVE mg/dL
Glucose, UA: NEGATIVE mg/dL
Glucose, UA: NEGATIVE mg/dL
Hgb urine dipstick: NEGATIVE
Ketones, ur: NEGATIVE mg/dL
Ketones, ur: NEGATIVE mg/dL
Ketones, ur: NEGATIVE mg/dL
Nitrite: NEGATIVE
Protein, ur: NEGATIVE mg/dL
Protein, ur: NEGATIVE mg/dL
Specific Gravity, Urine: 1.015 (ref 1.005–1.030)
Specific Gravity, Urine: <=1.005 (ref 1.005–1.030)
Specific Gravity, Urine: 1.015 (ref 1.005–1.030)
pH: 6 (ref 5.0–8.0)
pH: 7 (ref 5.0–8.0)
pH: 7 (ref 5.0–8.0)

## 2011-03-15 LAB — POCT URINALYSIS DIP (DEVICE)
Bilirubin Urine: NEGATIVE
Glucose, UA: NEGATIVE mg/dL
Ketones, ur: NEGATIVE mg/dL
Ketones, ur: NEGATIVE mg/dL
Protein, ur: NEGATIVE mg/dL
Protein, ur: NEGATIVE mg/dL
Protein, ur: NEGATIVE mg/dL
Specific Gravity, Urine: 1.015 (ref 1.005–1.030)
Specific Gravity, Urine: 1.015 (ref 1.005–1.030)
Specific Gravity, Urine: 1.015 (ref 1.005–1.030)
Urobilinogen, UA: 0.2 mg/dL (ref 0.0–1.0)
pH: 6.5 (ref 5.0–8.0)
pH: 7 (ref 5.0–8.0)
pH: 7 (ref 5.0–8.0)

## 2011-03-16 LAB — POCT URINALYSIS DIP (DEVICE)
Bilirubin Urine: NEGATIVE
Ketones, ur: NEGATIVE mg/dL
Nitrite: NEGATIVE
Protein, ur: NEGATIVE mg/dL
Protein, ur: NEGATIVE mg/dL
pH: 6 (ref 5.0–8.0)
pH: 7 (ref 5.0–8.0)

## 2011-03-21 LAB — URINE CULTURE: Colony Count: >=100000

## 2011-03-21 LAB — POCT URINALYSIS DIP (DEVICE)
Bilirubin Urine: NEGATIVE
Ketones, ur: NEGATIVE mg/dL
Nitrite: NEGATIVE
pH: 6.5 (ref 5.0–8.0)

## 2011-04-05 LAB — GLUCOSE, CAPILLARY: Glucose-Capillary: 93 mg/dL (ref 70–99)

## 2011-04-07 LAB — GLUCOSE, CAPILLARY: Glucose-Capillary: 111 mg/dL — ABNORMAL HIGH (ref 70–99)

## 2011-05-13 NOTE — Discharge Summary (Signed)
[facility]  Patient:    Carolyn Mendez, Carolyn Mendez                          MRN: [id number] Adm. Date:  [id number] Disc. Date: [id number] Attending:  Antionette Char                           Discharge Summary  SUMMARY:                      Patient is a 35 year old G2, P2 who presented with 40+ week IUP in breech delivery for low transverse cesarean section. Patient underwent low transverse cesarean section under spinal anesthesia and was found to have a double footling breech female weighing 8 pounds 15 ounces, 21.5 inches long with Apgars of 9 at one minute and 9 at five minutes. Patient tolerated procedure well and was taken to recovery room in stable condition.  Postoperatively patient continued to do well without problems. Patient was breast-feeding and desired Depo contractive at discharge. Patients staples were removed prior to discharge.  CONDITION ON DISCHARGE:       Good.  DISPOSITION:                  Discharged to home with family.  MEDICATIONS:                  1. Ibuprofen 600 mg one tab p.o. q.6h. p.r.n.                                  pain.                               2. Percocet one tab p.4-6h. p.r.n. pain.                               3. Prenatal vitamin q.d.                               4. Depo injection due in May.  INSTRUCTIONS:                 Patient was given specific discharge instructions in terms of her activity, diet, and wound care.  Patients staples were removed prior to discharge and Steri-Strips were placed.  She was given specific signs and symptoms to warrant further treatment.  FOLLOW-UP:                    [facility]  Patient and her husband voiced agreement and understanding of the above discharge plans and had no further questions. DD:  02/04/01 TD:  02/04/01 Job: [id number]

## 2011-05-13 NOTE — Op Note (Signed)
[facility]  Patient:    Carolyn Mendez, Carolyn Mendez                          MRN: [id number] Proc. Date: 02/01/01 Adm. Date:  [id number] Disc. Date: [id number] Attending:  Antionette Char                           Operative Report  PREOPERATIVE DIAGNOSIS:       Forty-plus-week intrauterine pregnancy, breech presentation.  POSTOPERATIVE DIAGNOSIS:      Forty-plus-week intrauterine pregnancy, breech presentation.  PROCEDURE:                    Primary low transverse cesarean section via Pfannenstiel.  SURGEON:                      Roseanna Rainbow, M.D.  ANESTHESIA:                   Spinal.  COMPLICATIONS:                None.  ESTIMATED BLOOD LOSS:         600 cc.  FLUIDS:                       1500 cc lactated Ringers.  URINE OUTPUT:                 300 cc clear urine at the end of the procedure.  INDICATIONS:                  The patient is a 35 year old para 1 at 40+ weeks with breech presentation.  FINDINGS:                     Female infant, double-footling breech presentation, thin meconium with none below the cords, neonatology present at delivery, Apgars 9/9.  Normal uterus, tubes and ovaries.  DESCRIPTION OF PROCEDURE:     The patient was taken to the operating room where a spinal anesthesia was administered without difficulty and found to be adequate.  She was then prepped and draped in the usual sterile fashion in the dorsal supine position with a leftward tilt.  A Pfannenstiel skin incision was then made with the scalpel and carried through to the underlying layer of fascia.  The fascia was incised in the midline and the incision extended laterally with Mayo scissors.  The superior aspect of the fascial incision was then grasped with Kocher clamps, elevated and the underlying rectus muscles dissected off.  Attention was then turned to the inferior aspect of this incision, which was manipulated in a similar fashion.  The rectus  muscles were separated in the midline and the parietal peritoneum identified and entered sharply with Metzenbaum scissors.  The peritoneal incision was then extended superiorly and inferiorly with good visualization of the bladder.  The bladder blade was then inserted and the vesicouterine peritoneum identified, grasped with pickups and entered sharply.  This incision was then extended laterally and the bladder flap created digitally.  The bladder blade was then reinserted and the lower uterine segment incised in a transverse fashion with a scalpel. The uterine incision was then incised laterally with the bandage scissors. The bladder blade was then removed and a complete breech extraction was then performed.  The nose and mouth were suctioned with the bulb  suction and the cord was clamped and cut.  The infant was handed off to the awaiting neonatologist.  The placenta was then removed, the uterus exteriorized and cleared of all clots and debris.  The uterine incision was then repaired with 0 Monocryl in a running-locked fashion; excellent hemostasis was noted.  The uterus was returned to the abdomen.  The gutters were cleared of all clots. The fascia was reapproximated with 0 Vicryl in a running fashion.  The skin was closed with staples.  The patient tolerated the procedure well.  Sponge, lap and needle counts were correct x 2.  Cefazolin 1 g was given at cord clamp.  Patient was taken to the PACU in stable condition. DD:  02/01/01 TD:  02/02/01 Job: [id number]

## 2011-09-27 LAB — POCT URINALYSIS DIP (DEVICE)
Bilirubin Urine: NEGATIVE
Glucose, UA: NEGATIVE mg/dL
Ketones, ur: NEGATIVE mg/dL
Specific Gravity, Urine: 1.01 (ref 1.005–1.030)

## 2011-09-27 LAB — URINE CULTURE: Colony Count: >=100000

## 2011-09-27 LAB — POCT PREGNANCY, URINE: Preg Test, Ur: NEGATIVE

## 2012-10-11 ENCOUNTER — Telehealth: Payer: Self-pay | Admitting: Clinical

## 2012-10-11 NOTE — Telephone Encounter (Signed)
Pt contacted CSW back and informed her she was at work and would contact CSW tomorrow.  Theresia Bough, MSW, Theresia Majors [phone]

## 2012-10-11 NOTE — Telephone Encounter (Signed)
Clinical Child psychotherapist (CSW) left a message for pt as CSW received verbal referral of pt need for possible CSW services.  Theresia Bough, MSW, Theresia Majors [phone]

## 2013-09-24 ENCOUNTER — Ambulatory Visit (INDEPENDENT_AMBULATORY_CARE_PROVIDER_SITE_OTHER): Payer: Self-pay | Admitting: Family Medicine

## 2013-09-24 ENCOUNTER — Encounter: Payer: Self-pay | Admitting: Family Medicine

## 2013-09-24 VITALS — BP 124/71 | HR 74 | Temp 98.5°F | Ht 63.0 in | Wt 198.6 lb

## 2013-09-24 DIAGNOSIS — E669 Obesity, unspecified: Secondary | ICD-10-CM

## 2013-09-24 DIAGNOSIS — K2970 Gastritis, unspecified, without bleeding: Secondary | ICD-10-CM

## 2013-09-24 DIAGNOSIS — F329 Major depressive disorder, single episode, unspecified: Secondary | ICD-10-CM

## 2013-09-24 MED ORDER — PANTOPRAZOLE SODIUM 40 MG PO TBEC: 40.0000 mg | DELAYED_RELEASE_TABLET | Freq: Every day | ORAL | Status: DC

## 2013-09-24 NOTE — Assessment & Plan Note (Signed)
Well-managed with Protonix orally. Will refill her medicine. Followup in 2 months.
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
## 2013-09-24 NOTE — Patient Instructions (Addendum)
It was nice to see you today!  Please take the escitalopram every day, to help with your mood.  I refilled your protonix today.  Come back when you get the [facility] card and we have your medical records, or in 2 months, whichever comes first.  Be well, Dr. Pollie Meyer

## 2013-09-24 NOTE — Assessment & Plan Note (Signed)
Seems well controlled on escitalopram 10 mg daily. No evidence for suicidal or homicidal ideation. I did encourage the patient to take her medicine consistently every single day, regardless of how she is feeling. This will optimize the benefit of her SSRI. Followup in 2 months.

## 2013-09-24 NOTE — Assessment & Plan Note (Signed)
Encouraged patient's efforts at healthy lifestyle. Discussed that ideally for her would be in the 140 pound range, as this would give her a BMI of around 25. Encouraged slow steady weight loss via exercise and diet management. As she does endorse a history of what sounds like prediabetes, I will have her return after she gets the [facility] card because at that point we may want to do an A1c or other risk stratification labs.

## 2013-09-24 NOTE — Progress Notes (Signed)
Interpreter Wyvonnia Dusky for [employer]

## 2013-10-04 ENCOUNTER — Ambulatory Visit: Payer: Self-pay

## 2014-01-29 ENCOUNTER — Ambulatory Visit (INDEPENDENT_AMBULATORY_CARE_PROVIDER_SITE_OTHER): Payer: No Typology Code available for payment source | Admitting: Family Medicine

## 2014-01-29 ENCOUNTER — Encounter: Payer: Self-pay | Admitting: Family Medicine

## 2014-01-29 VITALS — BP 110/61 | HR 70 | Temp 98.3°F | Ht 63.0 in | Wt 200.0 lb

## 2014-01-29 DIAGNOSIS — F3289 Other specified depressive episodes: Secondary | ICD-10-CM

## 2014-01-29 DIAGNOSIS — F329 Major depressive disorder, single episode, unspecified: Secondary | ICD-10-CM

## 2014-01-29 DIAGNOSIS — F32A Depression, unspecified: Secondary | ICD-10-CM

## 2014-01-29 DIAGNOSIS — R1011 Right upper quadrant pain: Secondary | ICD-10-CM

## 2014-01-29 LAB — COMPREHENSIVE METABOLIC PANEL
ALBUMIN: 4.3 g/dL (ref 3.5–5.2)
ALK PHOS: 76 U/L (ref 39–117)
ALT: 29 U/L (ref 0–35)
AST: 20 U/L (ref 0–37)
BUN: 16 mg/dL (ref 6–23)
CO2: 26 mEq/L (ref 19–32)
Calcium: 9.3 mg/dL (ref 8.4–10.5)
Chloride: 105 mEq/L (ref 96–112)
Creat: 0.66 mg/dL (ref 0.50–1.10)
Glucose, Bld: 104 mg/dL — ABNORMAL HIGH (ref 70–99)
POTASSIUM: 3.9 meq/L (ref 3.5–5.3)
SODIUM: 141 meq/L (ref 135–145)
TOTAL PROTEIN: 6.6 g/dL (ref 6.0–8.3)
Total Bilirubin: 0.6 mg/dL (ref 0.2–1.2)

## 2014-01-29 LAB — CBC WITH DIFFERENTIAL/PLATELET
BASOS PCT: 0 % (ref 0–1)
Basophils Absolute: 0 10*3/uL (ref 0.0–0.1)
EOS ABS: 0.2 10*3/uL (ref 0.0–0.7)
EOS PCT: 2 % (ref 0–5)
HCT: 36.3 % (ref 36.0–46.0)
HEMOGLOBIN: 12.3 g/dL (ref 12.0–15.0)
Lymphocytes Relative: 36 % (ref 12–46)
Lymphs Abs: 2.6 10*3/uL (ref 0.7–4.0)
MCH: 28.9 pg (ref 26.0–34.0)
MCHC: 33.9 g/dL (ref 30.0–36.0)
MCV: 85.4 fL (ref 78.0–100.0)
MONOS PCT: 6 % (ref 3–12)
Monocytes Absolute: 0.4 10*3/uL (ref 0.1–1.0)
NEUTROS PCT: 56 % (ref 43–77)
Neutro Abs: 4.1 10*3/uL (ref 1.7–7.7)
Platelets: 290 10*3/uL (ref 150–400)
RBC: 4.25 MIL/uL (ref 3.87–5.11)
RDW: 13.2 % (ref 11.5–15.5)
WBC: 7.4 10*3/uL (ref 4.0–10.5)

## 2014-01-29 LAB — POCT URINE PREGNANCY: Preg Test, Ur: NEGATIVE

## 2014-01-29 NOTE — Progress Notes (Signed)
Patient ID: Carolyn Mendez, female   DOB: 07/18/1976, 38 y.o.   MRN: [id number]  Spanish interpreter utilized for this visit.  HPI:  Abdominal pain: pt presents c/o RUQ pain which has been present for three months. Over the last 3 weeks, however, the pain started to occur every day. She mostly notices the pain whenever she lfits something heavy. The pain is not associated with eating. She has had normal bowel and bladder function. The pain also occurs if she walks for a long time. She does feel nausea with the pain and endorses some chills but has not checked her temperature. The pain occasionally wraps around to her back.  Depression: has stopped taking the lexapro. Thinks her mood is more or less okay. She does not want to resume the medicine. Denies SI.   ROS: See HPI  PMFSH: hx acid reflux & depression  PHYSICAL EXAM: BP 110/61  Pulse 70  Temp(Src) 98.3 F (36.8 C) (Oral)  Ht 5\' 3"  (1.6 m)  Wt 200 lb (90.719 kg)  BMI 35.44 kg/m2 Gen: NAD HEENT: NCAT, MMM Heart: RRR Lungs: CTAB, NWOB Abdomen: soft, no organomegaly. Obese. TTP in RUQ without rebound tenderness or guarding. Otherwise NTTP. Neuro: grossly nonfocal, speech intact  ASSESSMENT/PLAN:  See problem based charting for additional assessment/plan.  FOLLOW UP: F/u in 2 weeks for abdominal pain.  [name] J. Pollie MeyerMcIntyre, MD [facility]

## 2014-01-29 NOTE — Assessment & Plan Note (Signed)
Patient has stopped taking medicine and states her mood is stable. No signs of suicidal ideation. Will continue to periodically evaluate in the future. Advised to pt that if she would like to resume the medicine in the future then she can just let me know.

## 2014-01-29 NOTE — Patient Instructions (Signed)
It was great to see you again today!  We are checking an ultrasound and some bloodwork to see if your gallbladder is the cause of the pain. We will call you with the results.  Please follow up in 1-2 weeks.   Be well, Dr. Pollie MeyerMcIntyre

## 2014-01-29 NOTE — Assessment & Plan Note (Addendum)
Sx's most concerning for biliary colic, although differential diagnosis also includes hepatitis, gastritis, colitis. No signs of acute cholecystitis. Will check CMET & CBC with diff, along with ultrasound of abdomen to evaluate for presence of gallstones or other intraabdominal pathology. Precepted with Dr. Lum BabeEniola who also examined patient and agrees with this plan. Instructed pt to f/u in 2 weeks for the pain, or sooner if symptoms worsen.

## 2014-02-05 ENCOUNTER — Ambulatory Visit
Admission: RE | Admit: 2014-02-05 | Discharge: 2014-02-05 | Disposition: A | Discharge status: Home / Self Care | Payer: No Typology Code available for payment source | Source: Ambulatory Visit | Attending: Family Medicine | Admitting: Family Medicine

## 2014-02-05 ENCOUNTER — Telehealth: Payer: Self-pay | Admitting: Family Medicine

## 2014-02-05 DIAGNOSIS — R1011 Right upper quadrant pain: Secondary | ICD-10-CM

## 2014-02-05 IMAGING — US US ABDOMEN COMPLETE
1 series · 14 of 25 positions shown · non-contrast
Comparison: None.

CLINICAL DATA: Right upper quadrant abdominal pain.

EXAM:
ULTRASOUND ABDOMEN COMPLETE

[Series 1: us abdomen complete · 0.37mm/px · 14 of 86 slices shown]
[im 1/86]
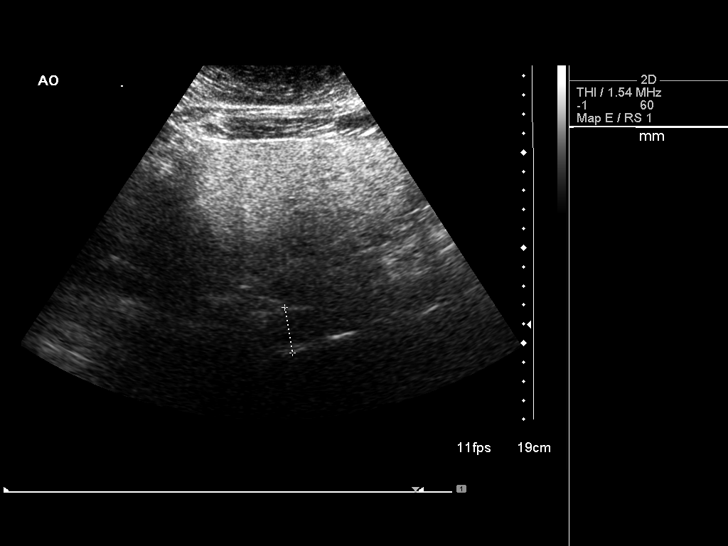
[im 8/86]
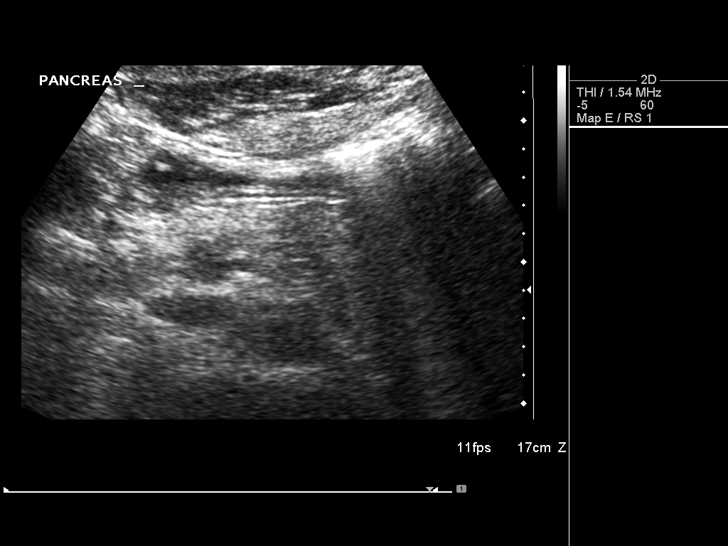
[im 15/86]
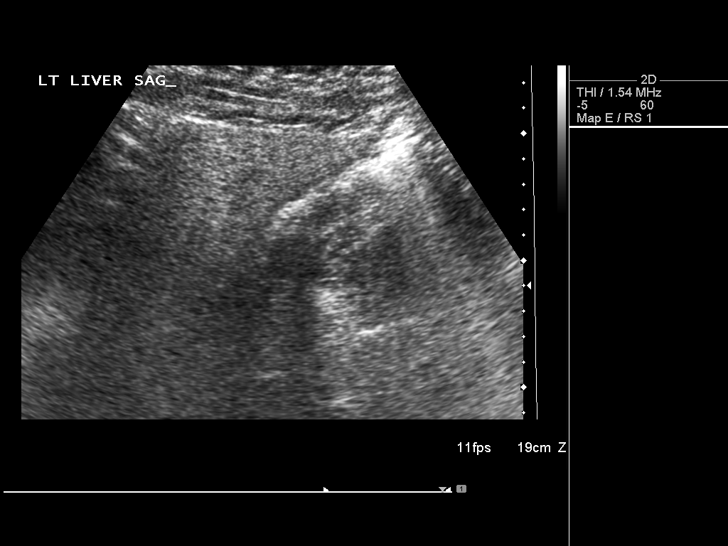
[im 22/86]
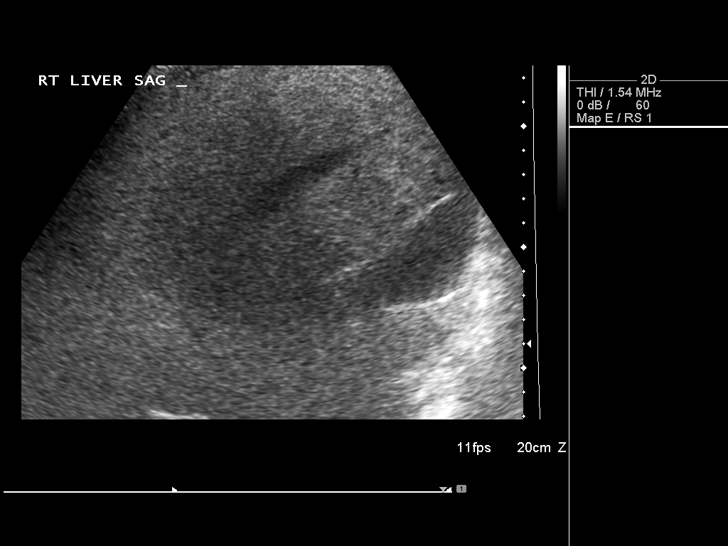
[im 29/86]
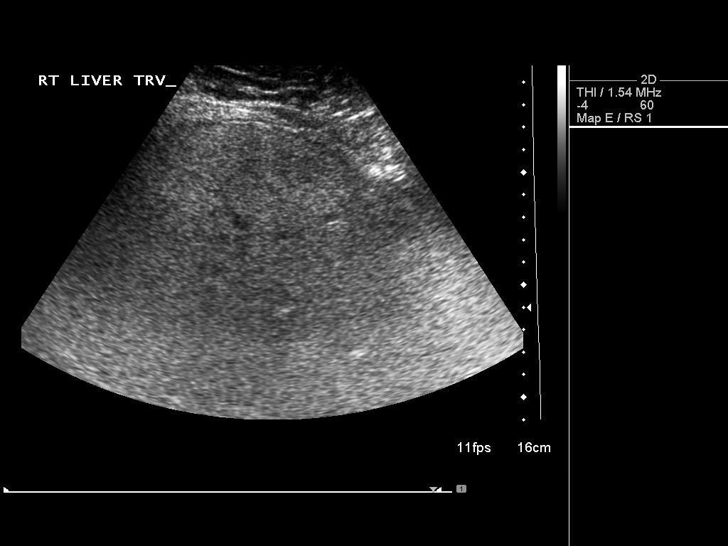
[im 32/86]
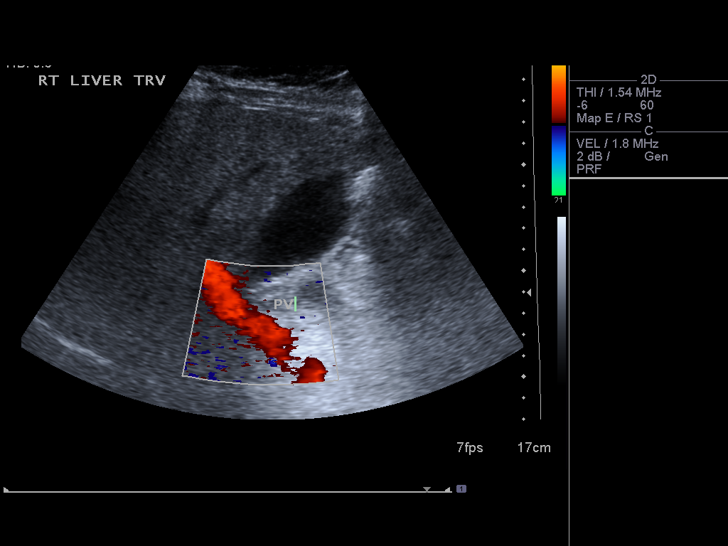
[im 39/86]
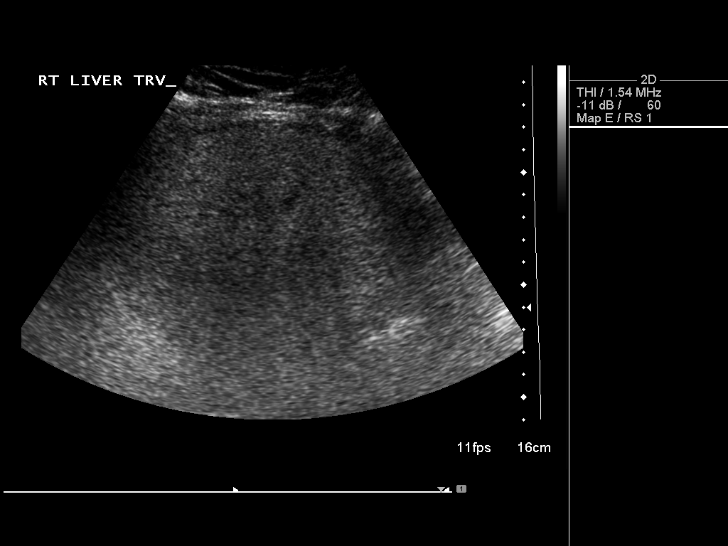
[im 47/86]
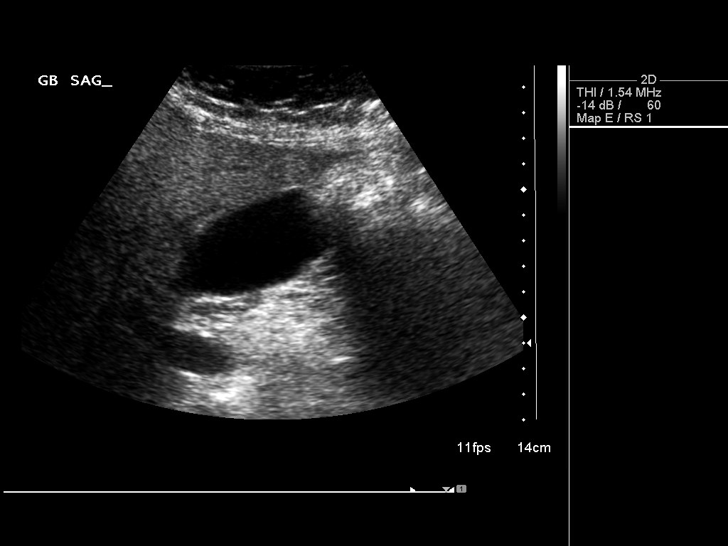
[im 54/86]
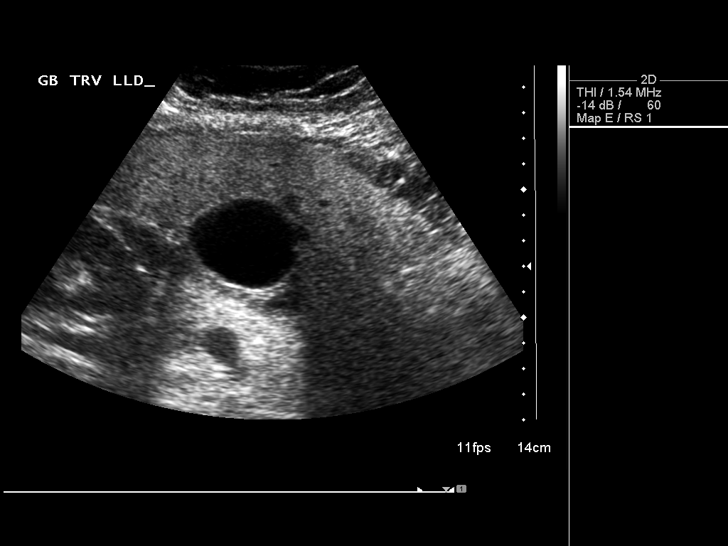
[im 57/86]
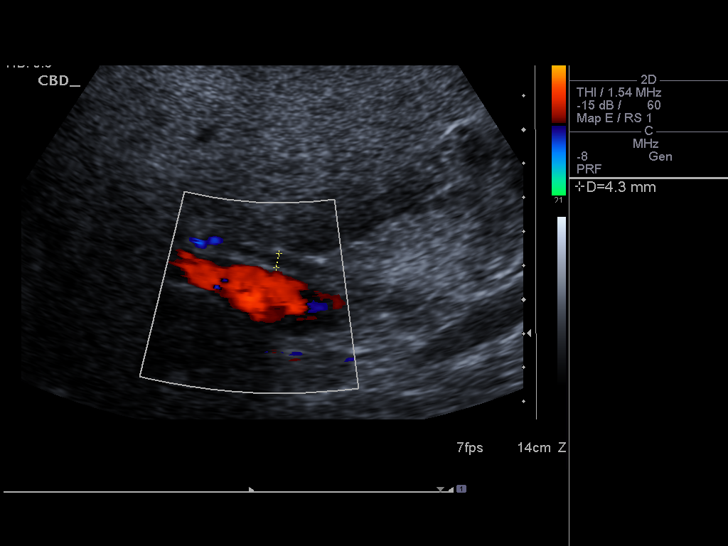
[im 64/86]
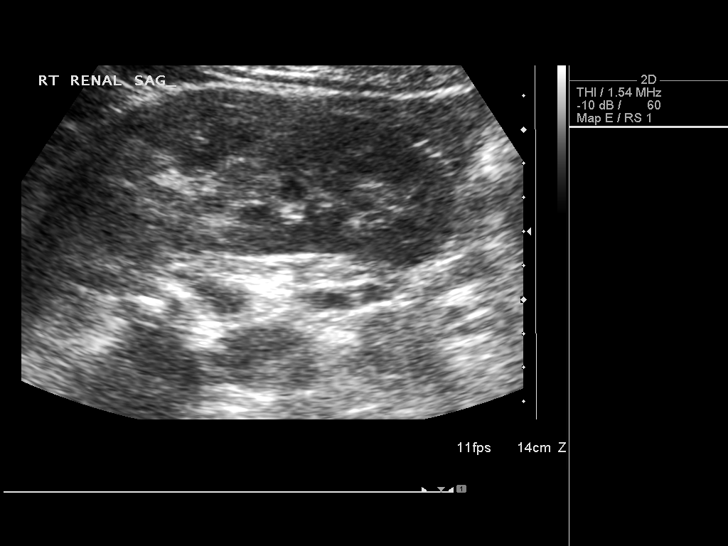
[im 71/86]
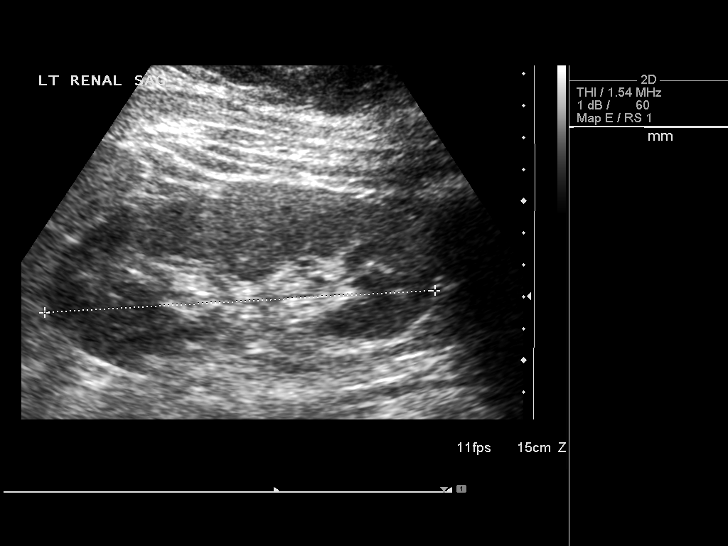
[im 78/86]
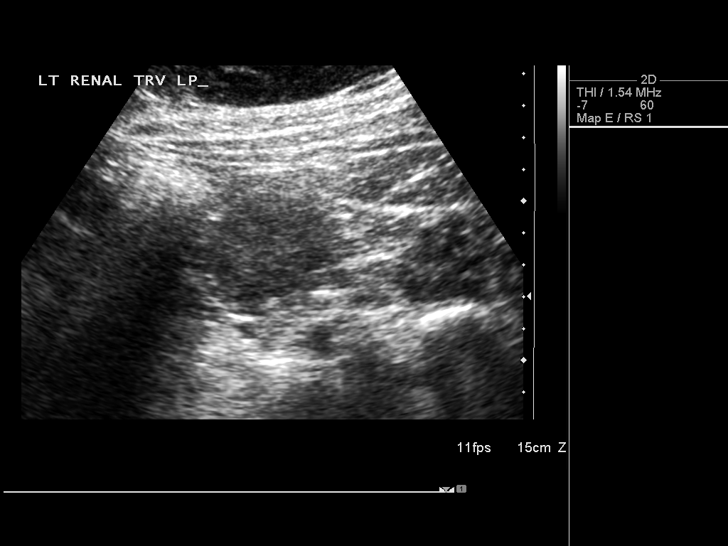
[im 86/86]
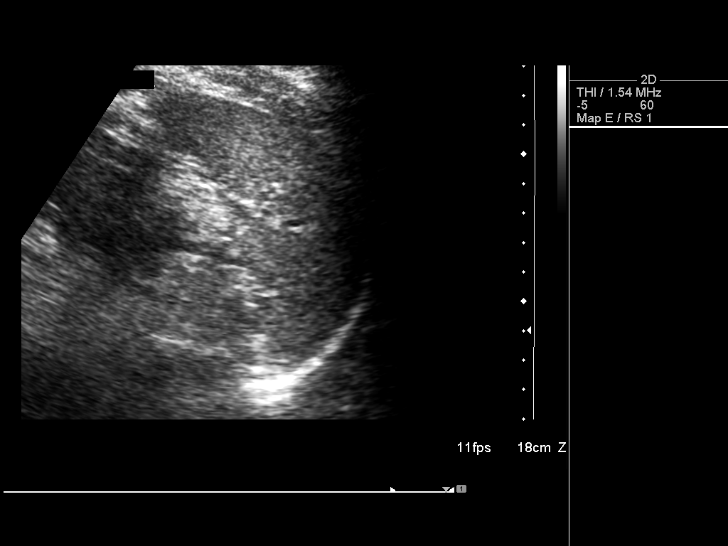

[14 of 25 positions shown; findings below may reference images not displayed]

FINDINGS: Gallbladder:

No gallstones or wall thickening visualized. No sonographic Murphy
sign noted.

Common bile duct:

Diameter: Measures 4.3 mm which is within normal limits.

Liver:

Increased echogenicity is noted of hepatic parenchyma consistent
with fatty infiltration. No focal abnormality is noted.

IVC:

No abnormality visualized.

Pancreas:

Visualized portion unremarkable.

Spleen:

Size and appearance within normal limits.

Right Kidney:

Length: 12.3 cm. Echogenicity within normal limits. No mass or
hydronephrosis visualized.

Left Kidney:

Length: 12.3 cm. Echogenicity within normal limits. No mass or
hydronephrosis visualized.

Abdominal aorta:

No aneurysm visualized.

Other findings:

None.
IMPRESSION: Fatty infiltration of the liver. No other abnormality seen in the
abdomen.

## 2014-02-05 NOTE — Telephone Encounter (Signed)
Called Pt and LVM. Pt should call us back.  [name]

## 2014-02-05 NOTE — Telephone Encounter (Signed)
[name], this pt is [place]. Can you call [name] and let her know that her ultrasound was normal, but that she should still keep her follow up appointment that she has scheduled for 2/26?  Thanks, [name]

## 2014-02-20 ENCOUNTER — Ambulatory Visit: Payer: No Typology Code available for payment source | Admitting: Family Medicine

## 2014-02-21 ENCOUNTER — Ambulatory Visit (INDEPENDENT_AMBULATORY_CARE_PROVIDER_SITE_OTHER): Payer: No Typology Code available for payment source | Admitting: Family Medicine

## 2014-02-21 ENCOUNTER — Encounter: Payer: Self-pay | Admitting: Family Medicine

## 2014-02-21 VITALS — BP 144/81 | HR 98 | Temp 98.8°F | Ht 63.0 in | Wt 199.0 lb

## 2014-02-21 DIAGNOSIS — B9789 Other viral agents as the cause of diseases classified elsewhere: Secondary | ICD-10-CM

## 2014-02-21 DIAGNOSIS — B349 Viral infection, unspecified: Secondary | ICD-10-CM

## 2014-02-21 NOTE — Patient Instructions (Signed)
Infecciones virales  (Viral Infections)  Un virus es un tipo de germen. Puede causar:   Dolor de garganta leve.  Dolores musculares.  Dolor de Turkmenistancabeza.  Secrecin nasal.  Erupciones.  Lagrimeo.  Cansancio.  Tos.  Prdida del apetito.  Ganas de vomitar (nuseas).  Vmitos.  Materia fecal lquida (diarrea). CUIDADOS EN EL HOGAR   Tome la medicacin slo como le haya indicado el mdico.  Beba gran cantidad de lquido para mantener la orina de tono claro o color amarillo plido. Las bebidas deportivas son Nadara Modeuna buena eleccin.  Descanse lo suficiente y Abbott Laboratoriesalimntese bien. Puede tomar sopas y caldos con crackers o arroz. SOLICITE AYUDA DE INMEDIATO SI:   Siente un dolor de cabeza muy intenso.  Le falta el aire.  Tiene dolor en el pecho o en el cuello.  Tiene una erupcin que no tena antes.  No puede detener los vmitos.  Tiene una hemorragia que no se detiene.  No puede retener los lquidos.  Usted o el nio tienen una temperatura oral le sube a ms de 38,9 C (102 F), y no puede bajarla con medicamentos.  Su beb tiene ms de 3 meses y su temperatura rectal es de 102 F (38.9 C) o ms.  Su beb tiene 3 meses o menos y su temperatura rectal es de 100.4 F (38 C) o ms. ASEGRESE DE QUE:   Comprende estas instrucciones.  Controlar la enfermedad.  Solicitar ayuda de inmediato si no mejora o si empeora. Document Released: 05/16/2011 Document Revised: 03/05/2012 [facility] Patient Information 2014 [employer] [place].   Continue to use over the counter cough and cold medications, may use motrin/tylenol for pain/fever, attempt trial of Netty Pot.

## 2014-02-21 NOTE — Progress Notes (Signed)
   Subjective:    Patient ID: Carolyn Mendez, female    DOB: 10/23/1976, 38 y.o.   MRN: [id number]  HPI 38 y/o female presents with 3 day history of subjective fevers and chills, associated nasal congestion, rhinorrhea, and sore throat, also has non-productive cough, son has similar symptoms, no nausea, no abdominal pain, no shortness of breath, no chest pain, she has taken motrin and tylenol as well as Alkaseltzer cold and flu without relief of symptoms   Review of Systems  Constitutional: Positive for fever, chills and fatigue.  HENT: Positive for congestion, postnasal drip, rhinorrhea, sinus pressure, sneezing and sore throat.   Respiratory: Positive for cough. Negative for shortness of breath.   Cardiovascular: Negative for chest pain.  Gastrointestinal: Negative for nausea, abdominal pain, diarrhea, constipation and abdominal distention.  Skin: Negative for rash and wound.       Objective:   Physical Exam Vitals: reviewed HEENT: normocephalic, PERRL, EOMI, bilateral TM's pearly grey without bulging, no scleral icterus, rhinorrhea present, MMM, postnasal drip present, mild pharyngeal erythema, neck supple, no cervical adenopathy Cardiac: RRR, S1 and S2 present, no murmurs, no heaves/thrills Resp: CTAB, normal effort Abd: soft, no tenderness, no rebound, no guarding, normal bowel sounds Skin: no rash       Assessment & Plan:  Please see problem specific assessment and plan.

## 2014-02-21 NOTE — Assessment & Plan Note (Signed)
Patient presents with signs/symptoms consistent with viral URI. -conservative management discussed as outlined in patient instructions.

## 2014-02-24 ENCOUNTER — Encounter (HOSPITAL_COMMUNITY): Payer: Self-pay | Admitting: Emergency Medicine

## 2014-02-24 ENCOUNTER — Emergency Department (HOSPITAL_COMMUNITY)
Admission: EM | Admit: 2014-02-24 | Discharge: 2014-02-24 | Disposition: A | Discharge status: Home / Self Care | Payer: No Typology Code available for payment source | Source: Home / Self Care | Attending: Emergency Medicine | Admitting: Emergency Medicine

## 2014-02-24 DIAGNOSIS — J040 Acute laryngitis: Secondary | ICD-10-CM

## 2014-02-24 LAB — POCT RAPID STREP A: STREPTOCOCCUS, GROUP A SCREEN (DIRECT): NEGATIVE

## 2014-02-24 MED ORDER — TRAMADOL HCL 50 MG PO TABS: 100.0000 mg | ORAL_TABLET | Freq: Three times a day (TID) | ORAL | Status: DC | PRN

## 2014-02-24 MED ORDER — PREDNISONE 20 MG PO TABS: 20.0000 mg | ORAL_TABLET | Freq: Two times a day (BID) | ORAL | Status: DC

## 2014-02-24 NOTE — ED Notes (Signed)
C/o sore throat and fever x 1 wk.  No relief with ibuprofen.  Denies n/v/d.

## 2014-02-24 NOTE — Discharge Instructions (Signed)
Laringitis   (Laryngitis)   En la parte superior de la tráquea está la laringe. La laringe es donde se produce la voz. Dentro de la laringe hay 2 bandas de músculos llamados cuerdas vocales. Al respirar, las cuerdas vocales se relajan y se abren, entonces el aire entra a los pulmones. Cuando queremos decir algo, las cuerdas vocales se juntan y vibran. El sonido de esas vibraciones va a la garganta y sale por la boca como la voz.    La laringitis es la inflamación de las cuerdas vocales, y causa ronquera, tos, pérdida de la voz,dolor y sequedad de la garganta. La laringitis puede ser transitoria (aguda) o permanente (crónica). En la mayoría de los casos, la laringitis aguda mejora con el tiempo. La laringitis crónica puede durar más de 3 semanas.  CAUSAS   Con frecuencia se relaciona con el fumar, hablar o gritar en exceso, así como por inhalación de humos tóxicos o alergias. Las causas más frecuentes de laringitis aguda son las infecciones virales, forzar la voz, las paperas o rubéola y las infecciones bacterianas. Las larigitis crónicas se producen por forzar o lesionar las cuerdas vocales, goteo pos nasal, por pólipos en las cuerdas vocales o por reflujo ácido.   SÍNTOMAS   · Tos.  · Dolor de garganta  · Garganta seca.  FACTORES DE RIESGO   · Infecciones respiratorias.  · Exposición a sustancias irritantes, como el humo del cigarrillo, cantidades excesivas de alcohol, los ácidos estomacales y el trabajar con sustancias químicas.  · Traumatismos de la voz, como lesiones en las cuerdas vocales por gritar o hablar demasiado fuerte.  DIAGNÓSTICO   El médico le hará un examen físico. Durante el examen físico, su médico le examinará la garganta. El signo de laringitis más frecuente es la ronquera. Puede ser necesario realizar una laringoscopía para confirmar el diagnóstico de esta enfermedad. Este procedimiento le permite al cirujano observar el interior de la laringe.   INSTRUCCIONES PARA EL CUIDADO EN EL HOGAR    · Debe ingerir gran cantidad de líquido para mantener la orina de tono claro o color amarillo pálido.  · Descanse hasta que no tenga más síntomas, o según las indicaciones del médico.  · Trate de respirar aire húmedo.  · Tome los medicamentos que le indicó su médico.  · No  fume.  · Hable lo menos posible (esto incluye los susurros).  · En vez de hablar, escriba en un papel, hasta que su voz vuelva a la normalidad.  · Si el problema no ha mejorado en 10 días, consulte a su médico.  SOLICITE ATENCIÓN MÉDICA SI:   · Tiene dificultad para respirar.  · Tose y escupe sangre.  · Tiene fiebre persistente.  · Aumenta el dolor.  · Tiene dificultad para tragar.  ASEGÚRESE DE QUE:   · Comprende estas instrucciones.  · Controlará su enfermedad.  · Solicitará ayuda de inmediato si no mejora o si empeora.  Document Released: 09/21/2005 Document Revised: 03/05/2012  Exit[employer]® Patient Information ©2014 [employer].

## 2014-02-24 NOTE — ED Provider Notes (Signed)
  Chief Complaint   Chief Complaint  Patient presents with  . Sore Throat    History of Present Illness   Carolyn Mendez is a 38 year old female who's had a one-week history of sore throat, hoarseness, fever up to 102, and chills. She denies any nasal congestion, rhinorrhea, headache, cough, or GI symptoms. She has been exposed to coworker who had the same symptoms.   Review of Systems   Other than as noted above, the patient denies any of the following symptoms. Systemic:  No fever, chills, sweats, myalgias, or headache. Eye:  No redness, pain or drainage. ENT:  No earache, nasal congestion, sneezing, rhinorrhea, sinus pressure, sinus pain, or post nasal drip. Lungs:  No cough, sputum production, wheezing, shortness of breath, or chest pain. GI:  No abdominal pain, nausea, vomiting, or diarrhea. Skin:  No rash.  PMFSH   Past medical history, family history, social history, meds, and allergies were reviewed.   Physical Exam     Vital signs:  LMP 02/09/2014 General:  Alert, in no distress. Phonation was normal, no drooling, and patient was able to handle secretions well.  Eye:  No conjunctival injection or drainage. Lids were normal. ENT:  TMs and canals were normal, without erythema or inflammation.  Nasal mucosa was clear and uncongested, without drainage.  Mucous membranes were moist.  Exam of pharynx reveals slight erythema but no swelling, exudate, or ulcerations.  There were no oral ulcerations or lesions. There was no bulging of the tonsillar pillars, and the uvula was midline. Neck:  Supple, no adenopathy, tenderness or mass. Lungs:  No respiratory distress.  Lungs were clear to auscultation, without wheezes, rales or rhonchi.  Breath sounds were clear and equal bilaterally.  Heart:  Regular rhythm, without gallops, murmers or rubs. Skin:  Clear, warm, and dry, without rash or lesions.  Labs   Results for orders placed during the hospital encounter of 02/24/14  POCT  RAPID STREP A ([facility] ONLY)      Result Value Ref Range   Streptococcus, Group A Screen (Direct) NEGATIVE  NEGATIVE   Assessment   The encounter diagnosis was Laryngitis.  There is no evidence of a peritonsillar abscess.    Plan     1.  Meds:  The following meds were prescribed:   Discharge Medication List as of 02/24/2014  8:36 PM    START taking these medications   Details  predniSONE (DELTASONE) 20 MG tablet Take 1 tablet (20 mg total) by mouth 2 (two) times daily., Starting 02/24/2014, Until Discontinued, Normal    traMADol (ULTRAM) 50 MG tablet Take 2 tablets (100 mg total) by mouth every 8 (eight) hours as needed., Starting 02/24/2014, Until Discontinued, Normal        2.  Patient Education/Counseling:  The patient was given appropriate handouts, self care instructions, and instructed in symptomatic relief, including hot saline gargles, throat lozenges, infectious precautions, and need to trade out toothbrush.  Advised voice rest.  3.  Follow up:  The patient was told to follow up here if no better in 3 to 4 days, or sooner if becoming worse in any way, and given some red flag symptoms such as difficulty swallowing or breathing which would prompt immediate return.       Reuben Likesavid C Blayze Haen, MD 02/24/14 2119

## 2014-02-26 LAB — CULTURE, GROUP A STREP

## 2014-03-20 ENCOUNTER — Encounter: Payer: Self-pay | Admitting: Family Medicine

## 2014-03-20 ENCOUNTER — Ambulatory Visit (INDEPENDENT_AMBULATORY_CARE_PROVIDER_SITE_OTHER): Payer: No Typology Code available for payment source | Admitting: Family Medicine

## 2014-03-20 VITALS — BP 93/62 | HR 87 | Temp 98.3°F | Ht 63.0 in | Wt 199.0 lb

## 2014-03-20 DIAGNOSIS — R1011 Right upper quadrant pain: Secondary | ICD-10-CM

## 2014-03-20 DIAGNOSIS — R070 Pain in throat: Secondary | ICD-10-CM

## 2014-03-20 MED ORDER — OMEPRAZOLE 40 MG PO CPDR: 40.0000 mg | DELAYED_RELEASE_CAPSULE | Freq: Every day | ORAL | Status: DC

## 2014-03-20 NOTE — Progress Notes (Signed)
Patient ID: Carolyn Mendez, female   DOB: 12/29/1975, 38 y.o.   MRN: [id number]  HPI:  Throat pain: went to urgent care for this about 3 weeks ago. It has come and gone since then. Her voice has also come and gone. Has not had nasal congestion recently. She feels like her throat is swollen, especially on the right side. No bleeding or sores in her mouth. Had a fever upon initially going to urgent care, but no fevers over the past few weeks. No weight loss.   RUQ pain: has a burning sensation in her RUQ. Gets this about once per day, lasts for 2-3 hours at a time. No blood in her stool. Has had nausea but no vomiting. Sometimes her stomach feels bloated or like her clothes are tight. No weight loss. Had labs done at last visit (CBC, CMET) and abd u/s which just showed fatty liver, no other abnormalities.  ROS: See HPI  PMFSH: hx obesity, gastritis  PHYSICAL EXAM: BP 93/62  Pulse 87  Temp(Src) 98.3 F (36.8 C) (Oral)  Ht 5\' 3"  (1.6 m)  Wt 199 lb (90.266 kg)  BMI 35.26 kg/m2  LMP 03/08/2014 Gen: NAD HEENT: NCAT, no anterior or posterior cervical lymphadenopathy. Oropharynx is moist and without exudates. No appreciable thyromegaly.  Heart: RRR, no murmurs Lungs: CTAB, NWOB Abdomen: hypoactive but present BS, obese, soft, nontender to palpation, negative murphy's sign, no masses or organomegaly appreciable Neuro: grossly nonfocal, speech intact  ASSESSMENT/PLAN:  See problem based charting for additional assessment/plan.  FOLLOW UP: F/u in 3 weeks for throat pain & RUQ pain.  [name] J. Pollie MeyerMcIntyre, MD [facility]

## 2014-03-20 NOTE — Patient Instructions (Signed)
It was great to see you again today!  We are going to try a stomach acid reflux medicine, called omeprazole. Please start taking it every day. Follow up with me in 3 weeks to see if you are getting any better.  Be well, Dr. Pollie MeyerMcIntyre

## 2014-03-24 DIAGNOSIS — R070 Pain in throat: Secondary | ICD-10-CM | POA: Insufficient documentation

## 2014-03-24 NOTE — Assessment & Plan Note (Deleted)
Had labs done at last visit (CBC, CMET) and abd u/s which just showed fatty liver, no other abnormalities. Suspect her RUQ pain

## 2014-03-24 NOTE — Assessment & Plan Note (Signed)
Etiology unclear. No red flags (fever, weight loss, LAD). Suspect this could be secondary to untreated gastritis/GERD. Will rx PPI and have pt f/u in a few weeks to monitor for improvement.

## 2014-03-24 NOTE — Assessment & Plan Note (Addendum)
Had labs done at last visit (CBC, CMET) and abd u/s which just showed fatty liver, no other abnormalities. Suspect her RUQ pain may be secondary to gastritis, and I wonder if her throat pain could be also reflux related. Will start PPI and have her f/u in a few weeks to determine if she is feeling any better. Precepted with Dr. McDiarmid who agrees with this plan.

## 2014-04-18 ENCOUNTER — Ambulatory Visit: Payer: No Typology Code available for payment source | Admitting: Family Medicine

## 2014-04-24 ENCOUNTER — Ambulatory Visit (INDEPENDENT_AMBULATORY_CARE_PROVIDER_SITE_OTHER): Payer: No Typology Code available for payment source | Admitting: Family Medicine

## 2014-04-24 ENCOUNTER — Encounter: Payer: Self-pay | Admitting: Family Medicine

## 2014-04-24 VITALS — BP 95/60 | HR 74 | Temp 98.5°F | Ht 63.0 in | Wt 201.0 lb

## 2014-04-24 DIAGNOSIS — B9789 Other viral agents as the cause of diseases classified elsewhere: Secondary | ICD-10-CM

## 2014-04-24 DIAGNOSIS — B349 Viral infection, unspecified: Secondary | ICD-10-CM

## 2014-04-24 MED ORDER — AZITHROMYCIN 250 MG PO TABS: ORAL_TABLET | ORAL | Status: DC

## 2014-04-24 NOTE — Progress Notes (Signed)
Subjective:     Patient ID: Carolyn Mendez, female   DOB: 01/20/1976, 38 y.o.   MRN: [id number]  HPI  38 yo here for URI symptoms x 5 days  - having severe chest congestion and cough - some difficulty with deep breathing - mild sore throat - no nasal congestion or HA  - wouldn't have come in but fever to 101 yesterday and this AM - feeling worse  No wheezing, cp, nausea, vomiting, diarrhea, constipation.   Review of Systems See above    Objective:   Physical Exam  Constitutional: She appears well-developed and well-nourished.  HENT:  Head: Normocephalic and atraumatic.  Right Ear: External ear normal.  Left Ear: External ear normal.  Nose: Nose normal.  Mouth/Throat: Oropharynx is clear and moist. No oropharyngeal exudate.  Eyes: Conjunctivae are normal. Pupils are equal, round, and reactive to light.  Neck: Neck supple.  Cardiovascular: Normal rate and regular rhythm.   Pulmonary/Chest: Effort normal and breath sounds normal.  Some decreased breath sounds in the RLL   Abdominal: Soft.  Lymphadenopathy:    She has no cervical adenopathy.   Filed Vitals:   04/24/14 1541  BP: 95/60  Pulse: 74  Temp: 98.5 F (36.9 C)  TempSrc: Oral  Height: 5\' 3"  (1.6 m)  Weight: 201 lb (91.173 kg)       Assessment:     Viral syndrome       Plan:     - likely viral syndrome. O2sat 99% - supportive care encouraged - rx printed for zpak should her fevers persist or symptoms worsen over the weekend - f/u if worsening SOB

## 2014-04-24 NOTE — Patient Instructions (Signed)
Bronquitis  (Bronchitis)  La bronquitis es una inflamación de las vías respiratorias que se extienden desde la tráquea hasta los pulmones (bronquios). A menudo, la inflamación produce la formación de mucosidad, lo que genera tos. Si la inflamación es grave, puede provocar falta de aire.  CAUSAS   Las causas de la bronquitis pueden ser:   · Infecciones virales.  · Bacterias.  · Humo del cigarrillo.  · Alérgenos, contaminantes y otros irritantes.  SIGNOS Y SÍNTOMAS   El síntoma más habitual de la bronquitis es la tos frecuente con mucosidad. Otros síntomas son:  · Fiebre.  · Dolores en el cuerpo.  · Congestión en el pecho.  · Escalofríos.  · Falta de aire.  · Dolor de garganta.  DIAGNÓSTICO   La bronquitis en general se diagnostica con la historia clínica y un examen físico. En algunos casos se indican otros estudios, como radiografías, para descartar otras enfermedades.   TRATAMIENTO   Tal vez deba evitar el contacto con la causa del problema (por ejemplo, el cigarrillo). En algunos casos es necesario administrar medicamentos. Estos pueden ser:  · Antibióticos. Tal vez se los receten si la causa de la bronquitis es una bacteria.  · Antitusivos. Tal vez se los receten para aliviar los síntomas de la tos.  · Medicamentos inhalados. Tal vez se los receten para liberar las vías respiratorias y facilitar la respiración.  · Medicamentos con corticoides. Tal vez se los receten si tiene bronquitis recurrente (crónica).  INSTRUCCIONES PARA EL CUIDADO EN EL HOGAR  · Descanse lo suficiente.  · Beba líquidos en abundancia para mantener la orina de color claro o amarillo pálido (excepto que padezca una enfermedad que requiera la restricción de líquidos). Tome mucho líquido para disolver las secreciones y evitar la deshidratación.  · Tome solo medicamentos de venta libre o recetados, según las indicaciones del médico.  · Tome los antibióticos exclusivamente según las indicaciones. Finalice la prescripción completa, aunque se  sienta mejor.  · Evite el humo de segunda mano, los químicos irritantes y los vapores fuertes. Estos agentes empeoran la bronquitis. Si es fumador, abandone el hábito. Considere el uso de goma de mascar o la aplicación de parches en la piel que contengan nicotina para aliviar los síntomas de abstinencia. Si deja de fumar, sus pulmones se curarán más rápido.  · Ponga un humidificador de vapor frío en la habitación por la noche para humedecer el aire. Puede ayudarlo a aflojar la mucosidad. Cambie el agua del humidificador a diario. También puede abrir el agua caliente de la ducha y sentarse en el baño con la puerta cerrada durante 5 a 10 minutos.  · Concurra a las consultas de control con su médico según las indicaciones.  · Lávese las manos con frecuencia para evitar contagiarse bronquitis nuevamente y para no extender la infección a otras personas.  SOLICITE ATENCIÓN MÉDICA SI:  Los síntomas no mejoran después de 1 semana de tratamiento.   SOLICITE ATENCIÓN MÉDICA DE INMEDIATO SI:  · La fiebre aumenta.  · Tiene escalofríos.  · Siente dolor en el pecho.  · Le empeora la falta el aire.  · La flema tiene sangre.  · Se desmaya.  · Tiene vahídos.  · Sufre un dolor intenso de cabeza.  · Vomita repetidas veces.  ASEGÚRESE DE QUE:   · Comprende estas instrucciones.  · Controlará su afección.  · Recibirá ayuda de inmediato si no mejora o si empeora.  Document Released: 12/12/2005 Document Revised: 10/02/2013  Exit[employer]® Patient Information ©2014 [employer].

## 2014-05-07 ENCOUNTER — Ambulatory Visit (INDEPENDENT_AMBULATORY_CARE_PROVIDER_SITE_OTHER): Payer: No Typology Code available for payment source | Admitting: Family Medicine

## 2014-05-07 ENCOUNTER — Encounter: Payer: Self-pay | Admitting: Family Medicine

## 2014-05-07 VITALS — BP 108/58 | HR 62 | Temp 98.1°F | Ht 63.0 in | Wt 199.5 lb

## 2014-05-07 DIAGNOSIS — N390 Urinary tract infection, site not specified: Secondary | ICD-10-CM | POA: Insufficient documentation

## 2014-05-07 DIAGNOSIS — R300 Dysuria: Secondary | ICD-10-CM

## 2014-05-07 LAB — POCT URINALYSIS DIPSTICK
Bilirubin, UA: NEGATIVE
GLUCOSE UA: NEGATIVE
KETONES UA: NEGATIVE
Nitrite, UA: NEGATIVE
SPEC GRAV UA: 1.015
Urobilinogen, UA: 0.2
pH, UA: 7

## 2014-05-07 LAB — POCT UA - MICROSCOPIC ONLY

## 2014-05-07 MED ORDER — CEPHALEXIN 500 MG PO CAPS: 500.0000 mg | ORAL_CAPSULE | Freq: Two times a day (BID) | ORAL | Status: DC

## 2014-05-07 NOTE — Patient Instructions (Signed)
Ha sido un placer verle hoy. - Por favor tome las medicinas como se le han recetado. - Tome bastante agua que mantenga la orina amarilla clara.  - Mantenga su proxima cita con su medico primario para hacerle seguimiento a su condicion.

## 2014-05-07 NOTE — Assessment & Plan Note (Addendum)
Symptomatic and positive UA dipstick. Micro for leukocytes and blood.  Will treat with keflex.  Her R flank pain does not seem pyelo since she has had it for quite a while, no fever and absence of systemic symptoms. Recent U/S was negative but this does not r/o the possibility of stone. Discussed signs of worsening condition that should prompt re-evaluation. Pt may benefit of CT abdomen if symptoms persist or worsens.  F/u with PCP after complete treatment was recommended.

## 2014-05-07 NOTE — Progress Notes (Signed)
Family Medicine Office Visit Note   Subjective:   Patient ID: Carolyn Mendez, female  DOB: 01/02/1976, 38 y.o.. MRN: [id number]   Pt that comes today for same day appointment complaining of dysuria for 3 days. Pt also reports R flank pain that radiates to abdomen she has been having for quite a while, even before her urinary symptoms started. She has been worked up for this in the past with complete abdominal Ultrasound that only showed fatty liver. She has taken NSAIDs and this helps with pain. Pt denies frequency, fever, nausea, vomiting or other systemic symptoms. No vaginal discharge or bleeding. LMP last week.  Review of Systems:  Per HPI  Objective:   Physical Exam: Gen:  Obese, NAD HEENT: Moist mucous membranes  CV: Regular rate and rhythm, no murmurs rubs or gallops PULM: Clear to auscultation bilaterally. No wheezes/rales/rhonchi ABD: Non distended, soft, no tenderness to superficial or deep palpation, no CVA tenderness. No guarding or rebound tenderness. Negative murphy sign. EXT: No edema Neuro: Alert and oriented x3. No focalization  Assessment & Plan:

## 2014-05-23 ENCOUNTER — Ambulatory Visit (INDEPENDENT_AMBULATORY_CARE_PROVIDER_SITE_OTHER): Payer: No Typology Code available for payment source | Admitting: Family Medicine

## 2014-05-23 VITALS — BP 106/50 | HR 66 | Temp 99.0°F | Resp 18 | Wt 199.0 lb

## 2014-05-23 DIAGNOSIS — N898 Other specified noninflammatory disorders of vagina: Secondary | ICD-10-CM

## 2014-05-23 DIAGNOSIS — N390 Urinary tract infection, site not specified: Secondary | ICD-10-CM

## 2014-05-23 DIAGNOSIS — L293 Anogenital pruritus, unspecified: Secondary | ICD-10-CM

## 2014-05-23 DIAGNOSIS — R1011 Right upper quadrant pain: Secondary | ICD-10-CM

## 2014-05-23 LAB — POCT URINALYSIS DIPSTICK
Bilirubin, UA: NEGATIVE
Glucose, UA: NEGATIVE
Ketones, UA: NEGATIVE
Leukocytes, UA: NEGATIVE
NITRITE UA: NEGATIVE
PH UA: 6
PROTEIN UA: NEGATIVE
Spec Grav, UA: <=1.005
Urobilinogen, UA: 0.2

## 2014-05-23 LAB — POCT UA - MICROSCOPIC ONLY

## 2014-05-23 MED ORDER — RANITIDINE HCL 150 MG PO TABS: 150.0000 mg | ORAL_TABLET | Freq: Two times a day (BID) | ORAL | Status: DC

## 2014-05-23 MED ORDER — FLUCONAZOLE 150 MG PO TABS: 150.0000 mg | ORAL_TABLET | Freq: Once | ORAL | Status: DC

## 2014-05-23 NOTE — Patient Instructions (Signed)
It was great to see you again today!  I sent in diflucan to treat you for yeast. For your side pain, take ibuprofen and tylenol. Also do some stretches for your muscles.  I sent in zantac, which is another acid reflux medicine.  Follow up in 4-6 weeks if the side pain is not better or sooner if you still have vaginal itching.  Be well, Dr. Pollie Meyer

## 2014-05-23 NOTE — Progress Notes (Signed)
Interpreter Wyvonnia Dusky for Dr Pollie Meyer

## 2014-05-23 NOTE — Progress Notes (Signed)
Patient ID: Carolyn Mendez, female   DOB: 12/20/1976, 38 y.o.   MRN: [id number]  HPI:  F/u abdominal pain: started PPI at last visit. She is tolerating this well without concerns. Has a little pain in her LUQ about one hour after taking it. Her throat pain is much improved. Overall GERD seems improved. No vomiting. Still has RUQ pain whenever she lifts things, also if she feels gassy.  UTI -  Recently treated, no more dysuria or flank pain. Has vaginal itching. No fevers. No vaginal discharge.  ROS: See HPI  PMFSH: hx gastritis, recent tx for UTI, depression  PHYSICAL EXAM: BP 106/50  Pulse 66  Temp(Src) 99 F (37.2 C) (Oral)  Resp 18  Wt 199 lb (90.266 kg)  SpO2 100%  LMP 04/30/2014 Gen: NAD HEENT: NCAT Heart: RRR, no murmurs Lungs: CTAB, NWOB Abdomen: soft, nontender to palpation, NABS Back: no CVA tenderness either side Neuro: grossly nonfocal, speech normal  ASSESSMENT/PLAN:  # Vaginal itching: likely represents yeast infection in setting of recent treatment with antibiotics. Will rx diflucan 150mg  x1, repeat in 3 days if sx's persist. Will also recheck urinalysis and culture to ensure this does not represent continued UTI (since culture not done previously). Pelvic exam deferred today - if sx's not improved with diflucan will need to perform pelvic exam.   See problem based charting for additional assessment/plan.   FOLLOW UP: F/u in 4-6 weeks for abdominal pain.  [place] J. Pollie Meyer, MD [facility]

## 2014-05-25 LAB — URINE CULTURE: Colony Count: 7000

## 2014-06-02 NOTE — Assessment & Plan Note (Addendum)
Reflux type sx's improved with PPI but pt also has LUQ pain after taking the medicine. Will add H2 blocker (zantac) to evaluate for improvement. RUQ now reported as being only when lifting heavy things. Suspect this to be musculoskeletal. Recommend she try tylenol/ibuprofen prn for this pain. Also can try heating pad. F/u in 4-6 weeks to reassess if not better.

## 2014-06-23 ENCOUNTER — Encounter: Payer: Self-pay | Admitting: Family Medicine

## 2014-06-23 ENCOUNTER — Ambulatory Visit (INDEPENDENT_AMBULATORY_CARE_PROVIDER_SITE_OTHER): Payer: No Typology Code available for payment source | Admitting: Family Medicine

## 2014-06-23 VITALS — BP 121/75 | HR 75 | Temp 98.2°F | Ht 63.0 in | Wt 200.2 lb

## 2014-06-23 DIAGNOSIS — R1011 Right upper quadrant pain: Secondary | ICD-10-CM

## 2014-06-23 DIAGNOSIS — K2970 Gastritis, unspecified, without bleeding: Secondary | ICD-10-CM

## 2014-06-23 DIAGNOSIS — K2990 Gastroduodenitis, unspecified, without bleeding: Secondary | ICD-10-CM

## 2014-06-23 LAB — POCT URINE PREGNANCY: Preg Test, Ur: NEGATIVE

## 2014-06-23 NOTE — Patient Instructions (Signed)
It was great to see you again today!  We are checking a HIDA scan for your gallbladder. Schedule an appointment to see me to discuss your results.  Be well, Dr. Thayer JewMcIntyre   Clico biliar (Biliary Colic)  El clico biliar es un dolor continuo o irregular en la zona superior del abdomen. Generalmente se ubica debajo de la zona derecha de la caja torcica. Aparece cuando los clculos biliares interfieren con el flujo normal de la bilis que proviene de la vescula. La bilis es un lquido que interviene en la digestin de las Fairviewgrasas. Se produce en el hgado y se almacena en la vescula. Al comer, La bilis pasa desde la vescula, a travs del conducto cstico y el conducto biliar comn al intestino delgado. All se mezcla con la comida parcialmente digerida. Si un clculo obstruye alguno de esos conductos, se detiene el flujo normal de bilis. Las clulas del conducto biliar se contraen con fuerza para mover el clculo. Esto causa el dolor del clico biliar.  SNTOMAS  El paciente se queja de dolor en la zona superior del abdomen. El dolor puede ser:  En el centro de la zona superior del abdomen, justo por debajo del esternn.  En la zona superior derecha del abdomen, donde se encuentra la vescula biliar y el hgado.  Se expande hacia la espalda, hacia el omplato derecho.  Nuseas y vmitos  El dolor comienza generalmente despus de comer.  El clico biliar aparece como una demanda de bilis por parte del sistema digestivo. La demanda de bilis es mayor luego de ingerir alimentos ricos en grasas. Los sntomas tambin Armed forces operational officerpueden aparecer en personas que han estado ayunando e ingieren abruptamente una comida abundante. La mayora de los episodios de clico biliar mejoran luego de 1 a 5 horas. Despus que se alivia el dolor ms intenso, podr seguir sintiendo un dolor moderado en el abdomen durante un lapso de 24 horas. DIAGNSTICO Luego de escuchar la descripcin de los sntomas, el mdico realizar un  examen fsico. Deber prestar atencin a la zona superior del abdomen. Esta es la zona en la que se encuentra el hgado y la vescula biliar. El mdico podr observar los clculos a travs de una ecografa. Tambin le realizaran un escaneo especializado de la vescula biliar. Le indicarn anlisis de [place]esangre, especialmente si tiene fiebre o el dolor persiste. PREVENCIN El clico biliar puede evitarse controlando los factores de riesgo que favorecen los clculos. Algunos de Toys ''R'' Usestos factores de riesgo como la herencia, el aumento de la edad y Firefighterel embarazo son aspectos normales de la vida. La obesidad y [place]a dieta rica en grasas son factores de riesgo que usted puede modificar a travs de cambios hacia un estilo de vida saludable. Las mujeres que atraviesan la menopausia y que reciben terapia de reemplazo hormonal (estrgenos) tambin tienen ms riesgo de Environmental education officerdesarrollar clicos biliares. TRATAMIENTO  Le prescribirn analgsicos.  Le indicarn una dieta sin grasas.  Si el primer episodio es intenso, o si los clicos aparecen nuevamente, generalmente se indica la ciruga para extirpar la vescula (colecistectoma). Este procedimiento puede realizarse a travs de pequeas incisiones utilizando un instrumento denominado laparoscopio. Muchas veces se requiere una breve estada en el hospital. Algunas personas reciben el alta el mismo da. Es el tratamiento ms ampliamente utilizado en personas que sufren dolor por clculos biliares. Es efectivo y seguro, no tiene complicaciones en ms del 90% de los [place]casos.  Si la ciruga no puede llevarse a cabo, podrn utilizarse medicamentos para Valero Energydisolver los clculos.  Esta medicacin es cara y puede demorar meses o aos hasta que Mattoontenga efecto. Slo podr disolver clculos pequeos.  En algunos casos raros, se combinan estos medicamentos con un procedimiento denominado litotricia por ondas de choque. Este procedimiento Coc[place] (Ke[place]) Islandsutiliza ondas de choque cuidadosamente dirigidas a romper los  clculos. En muchas personas tratadas con este procedimiento, los clculos vuelven a formarse luego de Freescale Semiconductoralgunos aos. PRONSTICO Si los clculos obstruyen el conducto cstico o conducto biliar comn, usted tiene el riesgo de sufrir episodios repetidos de clicos biliares. Tambin existe un 25% de probabilidades de desarrollar una infeccin de la vescula biliar (colecistitisaguda) o alguna otra complicacin en los siguientes 10 a 20 aos. Si ha sido sometido a [place] Herz[place]inauna ciruga, progrmela para el momento en que sea conveniente para usted, y para cuando no se encuentre enfermo. INSTRUCCIONES PARA EL CUIDADO DOMICILIARIO  Beba gran cantidad de lquidos claros.  Evite las comidas con mucha grasa o fritas, o cualquier alimento que empeore su dolor.  Tome los medicamentos como se le indic. SOLICITE ATENCIN MDICA SI:  Le sube la temperatura a ms de 100.5 F (38.1 C).  El dolor empeora con el Fremonttiempo.  Siente nuseas y AK Steel Holding Corporationesto le impide comer y beber.  Tiene vmitos. SOLICITE ATENCIN MDICA DE INMEDIATO SI:  Siente dolor continuo e intenso en el abdomen, que no se alivia con medicamentos.  Siente nuseas y vmitos que no mejoran con medicamentos.  Tiene sntomas de clico biliar y comienza a Lodema Pilottener fiebre y escalofros. Esto puede ser un indicio de que ha desarrollado colecistitis. Comunquese con su mdico inmediatamente.  Su piel o la parte blanca del ojo se vuelven amarillas (ictericia). Document Released: 03/20/2008 Document Revised: 03/05/2012 [facility] Patient Information 2015 [place]ExitCare, MarylandLLC. This information is not intended to replace advice given to you by your health care provider. Make sure you discuss any questions you have with your health care provider.

## 2014-06-24 NOTE — Progress Notes (Signed)
Patient ID: Carolyn Mendez, female   DOB: 10/05/1976, 38 y.o.   MRN: [id number]  HPI:  Abd pain f/u: still with RUQ pain. Thinks it is getting a little better. Having pain after eating, comes on 20 mins after eating and lasts 1 hour, then goes away. No vomiting or blood in her stool.  GERD: acid reflux improved with ranitidine. Not taking omeprazole as she thinks it caused LUQ pain.  Yeast infxn f/u: previously had vaginal itching after abx for UTI. Itching improved. No dysuria, fevers, or back pain.  ROS: See HPI  PMFSH: hx obesity, depression  PHYSICAL EXAM: BP 121/75  Pulse 75  Temp(Src) 98.2 F (36.8 C) (Oral)  Ht 5\' 3"  (1.6 m)  Wt 200 lb 3.2 oz (90.81 kg)  BMI 35.47 kg/m2  LMP 05/23/2014 Gen: NAD, pleasant, cooperative HEENT: NCAT, MMM Heart: RRR, no murmurs Lungs: CTAB, NWOB Abdomen: NABS. Soft, nontender to palpation. No murphy's sign. No palpable masses or organomegaly. Neuro: grossly nonfocal, speech normal  ASSESSMENT/PLAN:  See problem based charting for additional assessment/plan.   FOLLOW UP: F/u in 2-3 weeks after obtaining HIDA scan to review results and discuss abdominal pain.  [name] J. Pollie MeyerMcIntyre, MD [facility]

## 2014-06-29 NOTE — Assessment & Plan Note (Signed)
GERD sx's improved with ranitidine. Continue this medicine.

## 2014-06-29 NOTE — Assessment & Plan Note (Signed)
Persistent. Hx pt now giving is concerning for biliary colic. Exam benign today. She had an ultrasound which did not show any gallstones in February. Will obtain HIDA scan. Precepted with Dr. Mauricio PoBreen who agrees with this plan.

## 2014-07-10 ENCOUNTER — Encounter (HOSPITAL_COMMUNITY)
Admission: RE | Admit: 2014-07-10 | Discharge: 2014-07-10 | Disposition: A | Discharge status: Home / Self Care | Payer: No Typology Code available for payment source | Source: Ambulatory Visit | Attending: Family Medicine | Admitting: Family Medicine

## 2014-07-10 DIAGNOSIS — R1011 Right upper quadrant pain: Secondary | ICD-10-CM

## 2014-07-10 IMAGING — NM NM HEPATOBILIARY IMAGE, INC GB
1 series · 6 of 6 positions shown · non-contrast
Comparison: No prior nuclear imaging. Right upper quadrant
abdominal ultrasound [DATE].

CLINICAL DATA: Right upper quadrant abdominal pain. Nausea.
Possible biliary colic.

EXAM:
NUCLEAR MEDICINE HEPATOBILIARY IMAGING
TECHNIQUE: Sequential images of the abdomen were obtained [DATE] minutes
following intravenous administration of radiopharmaceutical.
RADIOPHARMACEUTICALS:  5.5 millicuries [DX] Choletec

[Series 1: hepato · 4.46mm/px · 6 of 47 frames shown]
[frame 4/47]
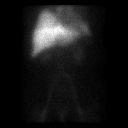
[frame 12/47]
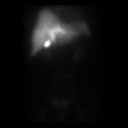
[frame 20/47]
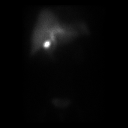
[frame 28/47]
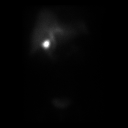
[frame 36/47]
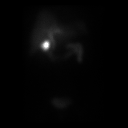
[frame 44/47]
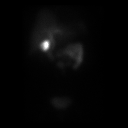

[6 of 6 positions shown; findings below may reference images not displayed]

FINDINGS: Hepatic uptake of Choletec is normal. Intra and extrahepatic bile
ducts are visible within 10 min. Small bowel activity is visible
within 20 min. Gallbladder activity is visible within 10 min, with
normal filling of the gallbladder throughout the remainder of the
imaging sequence. There is no significant tracer retention by the
liver.
IMPRESSION: Normal examination. Specifically, no evidence of cystic duct or
common bile duct obstruction.

## 2014-07-10 IMAGING — NM NM HEPATOBILIARY IMAGE, INC GB
1 series · 1 of 1 positions shown · non-contrast
Comparison: No prior nuclear imaging. Right upper quadrant
abdominal ultrasound [DATE].

CLINICAL DATA: Right upper quadrant abdominal pain. Nausea.
Possible biliary colic.

EXAM:
NUCLEAR MEDICINE HEPATOBILIARY IMAGING
TECHNIQUE: Sequential images of the abdomen were obtained [DATE] minutes
following intravenous administration of radiopharmaceutical.
RADIOPHARMACEUTICALS:  5.5 millicuries [DX] Choletec

[rt lateral · 1 of 1 slices shown]
[im 1/1]
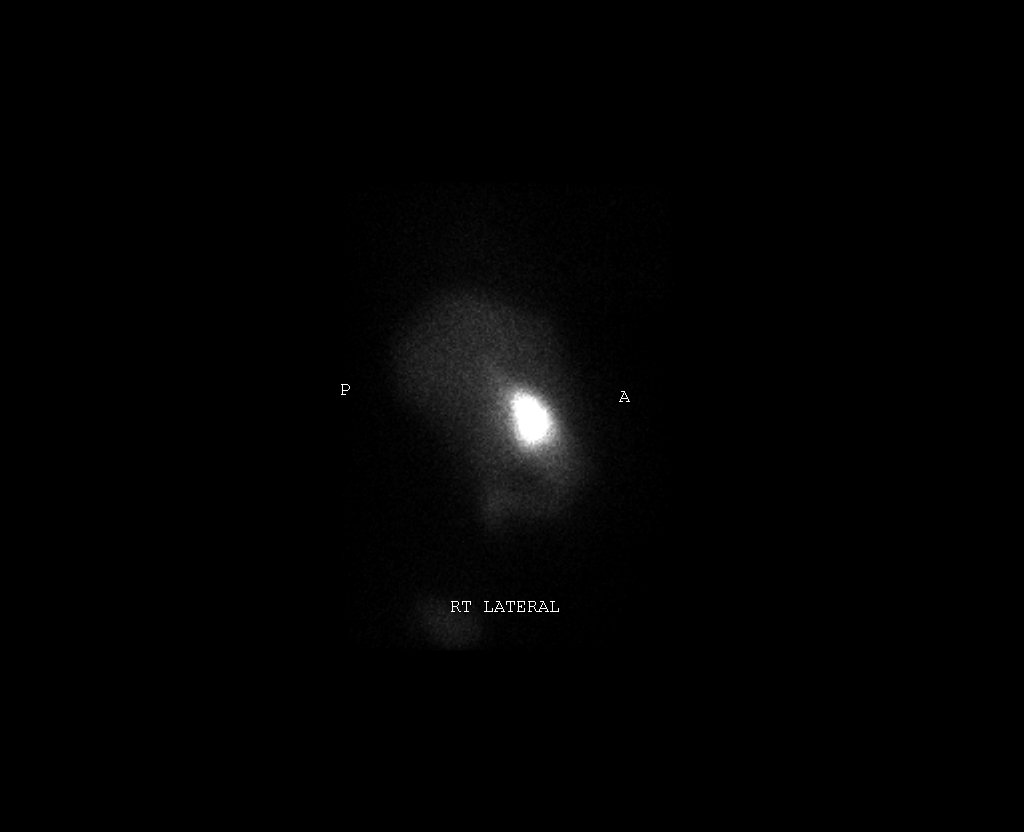

[1 of 1 positions shown; findings below may reference images not displayed]

FINDINGS: Hepatic uptake of Choletec is normal. Intra and extrahepatic bile
ducts are visible within 10 min. Small bowel activity is visible
within 20 min. Gallbladder activity is visible within 10 min, with
normal filling of the gallbladder throughout the remainder of the
imaging sequence. There is no significant tracer retention by the
liver.
IMPRESSION: Normal examination. Specifically, no evidence of cystic duct or
common bile duct obstruction.

## 2014-07-10 MED ORDER — TECHNETIUM TC 99M MEBROFENIN IV KIT
5.5000 | PACK | Freq: Once | INTRAVENOUS | Status: AC | PRN
  Administered 2014-07-10: 6 via INTRAVENOUS

## 2014-07-14 ENCOUNTER — Encounter: Payer: Self-pay | Admitting: Family Medicine

## 2014-07-14 ENCOUNTER — Ambulatory Visit (INDEPENDENT_AMBULATORY_CARE_PROVIDER_SITE_OTHER): Payer: No Typology Code available for payment source | Admitting: Family Medicine

## 2014-07-14 VITALS — BP 112/58 | HR 70 | Temp 98.2°F | Ht 63.0 in | Wt 200.8 lb

## 2014-07-14 DIAGNOSIS — R1011 Right upper quadrant pain: Secondary | ICD-10-CM

## 2014-07-14 MED ORDER — DICYCLOMINE HCL 20 MG PO TABS: 20.0000 mg | ORAL_TABLET | Freq: Four times a day (QID) | ORAL | Status: DC | PRN

## 2014-07-14 NOTE — Patient Instructions (Signed)
Follow up in 1 month. Take bentyl 4 times daily as needed.  Be well, Dr. Elzie Rings del intestino irritable (Irritable Bowel Syndrome) El sndrome del intestino irritable (SII) es causado por una alteracin en la funcin intestinal normal y es un trastorno digestivo frecuente. Esta afeccin tambin se conoce como colon espstico, colitis mucosa y colon irritable. No hay cura para el SII. Sin embargo, los sntomas a menudo mejoran gradualmente o desaparecen con una buena dieta, el control del estrs y Media planner. Esta afeccin generalmente aparece en la ltima etapa de la adolescencia o en la primera etapa de la adultez. Es dos veces ms frecuente en las mujeres que en los hombres. CAUSAS  Una vez que la comida se ha digerido y absorbido en el intestino delgado, el material de desecho se traslada al intestino grueso o colon. En el colon, se absorben el agua y las sales de los productos no digeridos que provienen del intestino delgado. Los residuos restantes, o materia fecal, se retienen para su eliminacin. En circunstancias normales, contracciones rtmicas, suaves de las paredes intestinales impulsan la materia fecal por el colon hacia el recto. Sin embargo, en el SII, estas contracciones son irregulares y Academic librarian coordinadas. La materia fecal se retiene Boeing, lo que causa estreimiento, o se expulsa demasiado pronto, provocando diarrea. SIGNOS Y SNTOMAS  El sntoma ms comn del SII es el dolor abdominal. A menudo se localiza en el lado izquierdo inferior del abdomen, pero puede ocurrir en cualquier parte del abdomen. El dolor proviene de la cantidad excesiva de espasmos de los msculos del intestino y de la acumulacin de gases y materia fecal en el colon. Este dolor:  Puede incluir desde clicos abdominales agudos a Herbalist sordo y continuo.  A menudo empeora despus de comer.  Con frecuencia se alivia al defecar o eliminar gases. El dolor abdominal generalmente  est acompaado de estreimiento, aunque tambin puede producir diarrea. La diarrea a menudo ocurre inmediatamente despus de una comida o al despertarse por la maana. La materia fecal suele ser Gerrit Heck, acuosa y con mucosidad. Otros sntomas del SII son:  Meteorismo.  Prdida del apetito.  Acidez estomacal.  Dolor de espalda.  Dolor sordo en los brazos o los hombros.  Nuseas.  Eructos.  Vmitos.  Gases. El SII tambin puede causar sntomas no relacionados con el aparato digestivo, como:  Fatiga.  Dolores de [place].  Ansiedad.  Falta de aire.  Dificultad para concentrarse.  Mareos. Estos sntomas suelen aparecer y Geneticist, molecular. DIAGNSTICO  Los sntomas del SII pueden parecerse a sntomas de otros trastornos digestivos ms graves. Es posible que su mdico le indique pruebas para descartar estos trastornos.  TRATAMIENTO Existen muchos medicamentos disponibles para ayudar a Medical illustrator funcin intestinal o Paramedic los espasmos intestinales y Chief Technology Officer abdominal. Entre estos se incluyen:  Laxantes para tratar el estreimiento severo y ayudar a restablecer hbitos intestinales normales.  Medicamentos antidiarreicos especficos para tratar la diarrea severa o de larga duracin.  Antiespasmdicos para Copywriter, advertising. El mdico tambin puede indicarle un tranquilizante o sedante suave durante perodos estresantes de su vida. El mdico tambin puede recetarle un medicamento antidepresivo. Se ha demostrado que el uso de este medicamento reduce el dolor y otros sntomas del [place]. Recuerde que si le recetan medicamentos, debe tomarlos exactamente segn las indicaciones. Asegrese de que su mdico sepa si funcionan bien para usted. INSTRUCCIONES PARA EL CUIDADO EN EL HOGAR   Tome todos los Estée Lauder indic el  mdico.  Evite los alimentos ricos en grasa o aceites, como crema espesa, Pemberton Heightsmantequilla, salchichas, embutidos y otras carnes grasas.  Evite  alimentos que lo hagan ir al bao, como frutas, jugos de fruta y productos lcteos.  Elimine las bebidas gaseosas, la goma de Theatre managermascar y los alimentos "que producen gases", como los frijoles y el repollo. Esto puede ayudar a Advertising copywriteraliviar el meteorismo y los eructos.  Consuma alimentos con salvado y beba abundantes lquidos cuando lo haga. Esto ayuda a Educational psychologistaliviar el estreimiento.  Lleve un registro de los alimentos que parecen causar sus sntomas.  Evite situaciones cargadas emocionalmente o circunstancias que producen ansiedad.  Comience o contine con un plan de ejercicios.  Descanse y duerma lo suficiente. Document Released: 12/12/2005 Document Revised: 12/17/2013 [facility] Patient Information 2015 [place]onegaExitCare, MarylandLLC. This information is not intended to replace advice given to you by your health care provider. Make sure you discuss any questions you have with your health care provider.

## 2014-07-14 NOTE — Progress Notes (Signed)
Patient ID: Carolyn Mendez, female   DOB: 01/04/1976, 38 y.o.   MRN: [id number]  Spanish phone interpreter utilized for this visit.  HPI:  Pt presents to f/u on abdominal pain. Continues to have pain in RUQ, primarily after eating. Never happens when she doesn't eat. Happens about 2-3 days out of the week. No fevers, blood in stool, vomiting. Has had some nausea. Also has some diarrhea about 2-3 times per week. Had HIDA scan which was normal. She is not extremely interested in gallbladder surgery to treat the pain at this time. She takes ranitidine for reflux, sx's are well controlled. Has BM about twice per day.  ROS: See HPI  PMFSH: hx obesity, depression, gastritis  PHYSICAL EXAM: BP 112/58  Pulse 70  Temp(Src) 98.2 F (36.8 C) (Oral)  Ht 5\' 3"  (1.6 m)  Wt 200 lb 12.8 oz (91.082 kg)  BMI 35.58 kg/m2  LMP 06/29/2014 Gen: NAD HEENT: NCAT, MMM Heart: RRR, no murmurs Lungs: CTAB, NWOB Abdomen: soft, nontender to palpation. +quiet bowel sounds. No organomegaly. Obese. Negative murphy's sign. Neuro: grossly nonfocal, speech normal.  ASSESSMENT/PLAN:  See problem based charting for additional assessment/plan.  FOLLOW UP: F/u in 1 month for RUQ pain/possible IBS.  [name] J. Pollie MeyerMcIntyre, MD [facility]

## 2014-07-14 NOTE — Assessment & Plan Note (Signed)
Sx's persist but are not severe. Reviewed labwork, u/s results, and HIDA scan, all of which are normal except for fatty infiltration of liver. This could possibly still be biliary colic which might improve with cholecystectomy, however pt is not eager to undergo surgery at this time. This also could represent irritable bowel syndrome or other functional abdominal pain. Plan: -trial of bentyl 20mg  QID prn pain -f/u in 1 month to reassess.  Precepted with Dr. Deirdre Priesthambliss who agrees with this plan.

## 2014-08-05 ENCOUNTER — Ambulatory Visit: Payer: Self-pay

## 2014-08-14 ENCOUNTER — Encounter: Payer: Self-pay | Admitting: Family Medicine

## 2014-08-14 ENCOUNTER — Ambulatory Visit (INDEPENDENT_AMBULATORY_CARE_PROVIDER_SITE_OTHER): Payer: Self-pay | Admitting: Family Medicine

## 2014-08-14 VITALS — BP 115/60 | HR 67 | Temp 98.0°F | Ht 62.0 in | Wt 197.9 lb

## 2014-08-14 DIAGNOSIS — R1011 Right upper quadrant pain: Secondary | ICD-10-CM

## 2014-08-14 DIAGNOSIS — H00023 Hordeolum internum right eye, unspecified eyelid: Secondary | ICD-10-CM

## 2014-08-14 DIAGNOSIS — H00029 Hordeolum internum unspecified eye, unspecified eyelid: Secondary | ICD-10-CM

## 2014-08-14 MED ORDER — DICYCLOMINE HCL 20 MG PO TABS: 20.0000 mg | ORAL_TABLET | Freq: Four times a day (QID) | ORAL | Status: DC | PRN

## 2014-08-14 NOTE — Patient Instructions (Signed)
Increase bentyl to 4 times per day Follow up in 1 month.  Orzuelo (Sty) Se trata de una infeccin en una glndula del prpado, Haitiubicada en la base de [place]una pestaa. Una orzuelo puede desarrollar un punto de pus blanco o amarillo. Puede inflamarse. Generalmente el orzuelo se abre y el pus comienza a salir espontneamente. Una vez que drenan, no dejan bulto en el prpado. Un orzuelo a menudo se confunde con otra forma de quiste del prpado que se denomina chalazion. El chalazion aparece dentro del prpado y no en el borde en el que se encuentran las bases de las Edwardsvillepestaas. A menudo son rojizos, duelen y forman bultos en el prpado. CAUSAS  Grmenes (bacterias).  Inflamacin del prpado de larga duracin (crnica). SNTOMAS  Molestias, enrojecimiento e inflamacin en el borde del prpado en la base de las pestaas.  A veces puede desarrollar un punto de pus blanco o amarillo. Puede drenar o no. DIAGNSTICO Un oftalmlogo podr distinguir entre un orzuelo y un chalazin y tratar la enfermedad.  TRATAMIENTO  Los orzuelos normalmente se tratan con compresas calientes [place] Millshasta que drenen.  En pocos caos, el profesional que lo asiste podr prescribirle medicamentos que destruyen grmenes (antibiticos). Estos antibiticos podrn prescribirse en forma de gotas, cremas o pldoras.  Si se forma un bulto duro, en general ser necesario realizar una pequea incisin y eliminar la parte endurecida del quiste en un procedimiento de ciruga menor que se Electronics engineerrealiza en el consultorio.  En algunos casos, el mdico podr enviar el contenido del quiste al laboratorio para asegurarse de que no es una forma de cncer rara pero peligroso de las glndulas del prpado. INSTRUCCIONES PARA EL CUIDADO DOMICILIARIO  Lave sus manos con frecuencia y squelas con una toalla limpia. Evite tocarse el prpado. Esto puede diseminar la infeccin a otras partes del ojo.  Aplique calor sobre el prpado durante 10 a 20 minutos varias  veces por da para Engineer, materialsaliviar el dolor y ayudar a que se cure ms rpidamente.  No apriete el orzuelo. Permita que drene slo. Lvese el prpado cuidadosamente 3  4 veces por da para retirar el pus. SOLICITE ATENCIN MDICA DE INMEDIATO SI:  Comienza a sentir dolor en el ojo, o se le hincha.  La visin se modifica.  El orzuelo no drena por s mismo en 3 das.  El orzuelo aparece nuevamente despus de un breve perodo, an con tratamiento.  Observa enrojecimiento (inflamacin) alrededor del ojo.  Tiene fiebre. Document Released: 09/21/2005 Document Revised: 03/05/2012 [facility]Exit[facility] Patient Information 2015 [place] NeckExitCare, MarylandLLC. This information is not intended to replace advice given to you by your health care provider. Make sure you discuss any questions you have with your health care provider.

## 2014-08-21 DIAGNOSIS — H00029 Hordeolum internum unspecified eye, unspecified eyelid: Secondary | ICD-10-CM | POA: Insufficient documentation

## 2014-08-21 NOTE — Assessment & Plan Note (Signed)
Improved with bentyl, suggests etiology of pain is IBS. Recommend titrating up to 4 times per day. Follow up in 1 month to evaluate for improvement.

## 2014-08-21 NOTE — Progress Notes (Signed)
Patient ID: Carolyn Mendez, female   DOB: [birth year], 38 y.o.   MRN: [id number]  Spanish interpreter utilized during this visit.  HPI:  Abd pain: taking bentyl about 2-3 times per day. Feeling a little better. Still has pain in RUQ on occasion. Has diarrhea about 4-5 times per week. No blood in stool. The cramping feeling in her stomach improves with bowel movements.  Spot on eye: since last week had a pimple on the bottom of her eye lid. On Monday started to have one in her upper eyelid. No history of this in the past. No vision changes. It burns a little. Has used a penicillin cream that a relative brought from [place]. Had a fever of 101 on Monday, no fevers since.  ROS: See HPI.  PMFSH: hx obesity, depression, RUQ pain  PHYSICAL EXAM: BP 115/60  Pulse 67  Temp(Src) 98 F (36.7 C) (Oral)  Ht  (1.575 m)  Wt 197 lb 14.4 oz (89.767 kg)  BMI 36.19 kg/m2  LMP 08/04/2014 Gen: NAD HEENT: NCAT. R lower eyelid normal. Small red papule present on inside of upper R eyelid. No scleral injection or perilimbal redness. EOMI. PERRL. Tender small preauricular lymph node. Heart: RRR, no murmurs Lungs: CTAB, NWOB Neuro: grossly nonfocal, speech normal Ext: No appreciable lower extremity edema bilaterally   ASSESSMENT/PLAN:  Sty, internal Has sty on internal aspect of R upper lateral lid. No drainage or signs of infection, visual acuity excellent. Recommend warm compresses, stop penicillin cream. F/u if not improving. Precepted with Dr. Mauricio Po who also examined patient and agrees with this plan.   RUQ pain Improved with bentyl, suggests etiology of pain is IBS. Recommend titrating up to 4 times per day. Follow up in 1 month to evaluate for improvement.   FOLLOW UP: F/u in 1 month for IBS  [place] J. Pollie Meyer, MD [facility]

## 2014-08-21 NOTE — Assessment & Plan Note (Addendum)
Has sty on internal aspect of R upper lateral lid. No drainage or signs of infection, visual acuity excellent. Recommend warm compresses, stop penicillin cream. F/u if not improving. Precepted with Dr. Mauricio Po who also examined patient and agrees with this plan.

## 2014-08-27 ENCOUNTER — Other Ambulatory Visit (HOSPITAL_COMMUNITY)
Admission: RE | Admit: 2014-08-27 | Discharge: 2014-08-27 | Disposition: A | Discharge status: Home / Self Care | Payer: Self-pay | Source: Ambulatory Visit | Attending: Family Medicine | Admitting: Family Medicine

## 2014-08-27 ENCOUNTER — Encounter: Payer: Self-pay | Admitting: Family Medicine

## 2014-08-27 ENCOUNTER — Ambulatory Visit (INDEPENDENT_AMBULATORY_CARE_PROVIDER_SITE_OTHER): Payer: Self-pay | Admitting: Family Medicine

## 2014-08-27 VITALS — BP 104/65 | HR 69 | Temp 98.3°F | Ht 62.0 in | Wt 197.9 lb

## 2014-08-27 DIAGNOSIS — Z113 Encounter for screening for infections with a predominantly sexual mode of transmission: Secondary | ICD-10-CM

## 2014-08-27 DIAGNOSIS — Z13220 Encounter for screening for lipoid disorders: Secondary | ICD-10-CM

## 2014-08-27 DIAGNOSIS — Z1151 Encounter for screening for human papillomavirus (HPV): Secondary | ICD-10-CM | POA: Insufficient documentation

## 2014-08-27 DIAGNOSIS — N760 Acute vaginitis: Secondary | ICD-10-CM | POA: Insufficient documentation

## 2014-08-27 DIAGNOSIS — Z01419 Encounter for gynecological examination (general) (routine) without abnormal findings: Secondary | ICD-10-CM | POA: Insufficient documentation

## 2014-08-27 DIAGNOSIS — Z124 Encounter for screening for malignant neoplasm of cervix: Secondary | ICD-10-CM

## 2014-08-27 NOTE — Progress Notes (Signed)
Patient ID: Carolyn Mendez, female   DOB: [birth year], 38 y.o.   MRN: [id number]  HPI:  Patient presents today for a well woman exam.   Concerns today: none. Doing well, IBS sx's improved with increased frequency of bentyl. Periods: gets period monthly, late period last month Contraception: uses condoms every time Pelvic symptoms: no vag d/c or pelvic pain (occ mild pain, nothing bad) Sexual activity: four months ago was last sexual activity STD Screening: wants STD testing today Pap smear status: due today Exercise: last month started going to zumba classes Diet: tries to eat healthy  ROS: See HPI  PMFSH:  Cancers in family: maternal grandmother had uterine cancer, dx'd around age 44  PHYSICAL EXAM: BP 104/65  Pulse 69  Temp(Src) 98.3 F (36.8 C) (Oral)  Ht  (1.575 m)  Wt 197 lb 14.4 oz (89.767 kg)  BMI 36.19 kg/m2  LMP 08/04/2014 Gen: NAD, pleasant, cooperative HEENT: NCAT, PERRL, no palpable thyromegaly or anterior cervical lymphadenopathy Heart: RRR, no murmurs Lungs: CTAB, NWOB Abdomen: soft, nontender to palpation Neuro: grossly nonfocal, speech normal GU: normal appearing external genitalia without lesions. Vagina is moist with white discharge. Cervix normal in appearance. No cervical motion tenderness or tenderness on bimanual exam. No adnexal masses.   ASSESSMENT/PLAN:  # Health maintenance:  -STD screening: will check gc/chlamydia/trich with pap, pt to return for blood draw for HIV, RPR, acute hepatitis panel -pap smear: done today -mammogram: not yet indicated -lipid screening: will return for fasting labs -handout given on health maintenance topics  FOLLOW UP: F/u in 2 months for IBS.  [place] J. Pollie Meyer, MD [facility]

## 2014-08-29 LAB — CYTOLOGY - PAP

## 2014-09-02 ENCOUNTER — Other Ambulatory Visit: Payer: Self-pay

## 2014-09-02 DIAGNOSIS — Z13220 Encounter for screening for lipoid disorders: Secondary | ICD-10-CM

## 2014-09-02 DIAGNOSIS — Z113 Encounter for screening for infections with a predominantly sexual mode of transmission: Secondary | ICD-10-CM

## 2014-09-02 LAB — LIPID PANEL
Cholesterol: 128 mg/dL (ref 0–200)
HDL: 28 mg/dL — AB (ref >39)
LDL Cholesterol: 71 mg/dL (ref 0–99)
TRIGLYCERIDES: 143 mg/dL (ref <150)
Total CHOL/HDL Ratio: 4.6 Ratio
VLDL: 29 mg/dL (ref 0–40)

## 2014-09-02 NOTE — Progress Notes (Signed)
HIV,FLP AND RPR DONE TODAY Carolyn Mendez

## 2014-09-03 LAB — RPR

## 2014-09-03 LAB — HIV ANTIBODY (ROUTINE TESTING W REFLEX): HIV: NONREACTIVE

## 2014-09-05 ENCOUNTER — Encounter: Payer: Self-pay | Admitting: Family Medicine

## 2015-01-02 ENCOUNTER — Encounter: Payer: Self-pay | Admitting: Family Medicine

## 2015-02-09 ENCOUNTER — Ambulatory Visit: Payer: Self-pay

## 2015-04-21 ENCOUNTER — Ambulatory Visit: Payer: Self-pay

## 2016-02-15 ENCOUNTER — Ambulatory Visit: Payer: Self-pay

## 2016-02-25 ENCOUNTER — Ambulatory Visit (INDEPENDENT_AMBULATORY_CARE_PROVIDER_SITE_OTHER): Payer: Self-pay | Admitting: Family Medicine

## 2016-02-25 ENCOUNTER — Encounter: Payer: Self-pay | Admitting: Family Medicine

## 2016-02-25 ENCOUNTER — Other Ambulatory Visit (HOSPITAL_COMMUNITY)
Admission: RE | Admit: 2016-02-25 | Discharge: 2016-02-25 | Disposition: A | Discharge status: Home / Self Care | Payer: Self-pay | Source: Ambulatory Visit | Attending: Family Medicine | Admitting: Family Medicine

## 2016-02-25 VITALS — BP 110/70 | HR 66 | Temp 98.1°F | Ht 62.0 in | Wt 194.0 lb

## 2016-02-25 DIAGNOSIS — R109 Unspecified abdominal pain: Secondary | ICD-10-CM

## 2016-02-25 DIAGNOSIS — Z113 Encounter for screening for infections with a predominantly sexual mode of transmission: Secondary | ICD-10-CM | POA: Insufficient documentation

## 2016-02-25 DIAGNOSIS — K589 Irritable bowel syndrome without diarrhea: Secondary | ICD-10-CM | POA: Insufficient documentation

## 2016-02-25 DIAGNOSIS — N760 Acute vaginitis: Secondary | ICD-10-CM | POA: Insufficient documentation

## 2016-02-25 LAB — POCT URINALYSIS DIPSTICK
Bilirubin, UA: NEGATIVE
Glucose, UA: NEGATIVE
Ketones, UA: NEGATIVE
Leukocytes, UA: NEGATIVE
Nitrite, UA: NEGATIVE
PH UA: 6.5
PROTEIN UA: NEGATIVE
Spec Grav, UA: <=1.005
Urobilinogen, UA: 0.2

## 2016-02-25 LAB — POCT URINE PREGNANCY: PREG TEST UR: NEGATIVE

## 2016-02-25 MED ORDER — DICYCLOMINE HCL 20 MG PO TABS: 20.0000 mg | ORAL_TABLET | Freq: Four times a day (QID) | ORAL | Status: DC

## 2016-02-25 NOTE — Progress Notes (Signed)
Date of Visit: 02/25/2016   HPI:  Spanish interpreter utilized during today's visit.   Patient presents to discuss abdominal pain. Has had pain for about 2-3 months. Located primarily in RUQ. Has gotten worse this month. Worse when she's stressed out and when she eats spicy food. Does not really eat fried or fatty foods. Has had both diarrhea and constipation. No blood in stool. LMP Feb 12. Feels some pelvic discomfort as well. Not sexually active currently. Feels swelling, gas, cramping in abdomen. Located in front and sometimes goes to the back. Also endorses feeling dizzy about 2-3 times a week. Sometimes sees spots when she feels dizzy. Has not tried any medication for abdominal pain. Previously took bentyl but hasn't taken in a long time.   ROS: See HPI.  PMFSH: history of obesity, depression, gastritis  PHYSICAL EXAM: BP 110/70 mmHg  Pulse 66  Temp(Src) 98.1 F (36.7 C) (Oral)  Ht  (1.575 m)  Wt 194 lb (87.998 kg)  BMI 35.47 kg/m2  SpO2 98%  LMP 02/07/2016 Gen: NAD, pleasant, cooperative HEENT: normocephalic, atraumatic, mmm Heart: regular rate and rhythm, no murmur Lungs: clear to auscultation bilaterally, normal work of breathing  Abdomen: soft, nontender to palpation. Obese. No peritoneal signs, guarding, or rebound. No masses palpated.  Neuro: alert, grossly nonfocal, speech normal GU: normal appearing external genitalia without lesions. Vagina is moist with minimal discharge. Cervix normal in appearance. No cervical motion tenderness or tenderness on bimanual exam. No adnexal masses.  Ext: No appreciable lower extremity edema bilaterally   ASSESSMENT/PLAN:  Irritable bowel disease Suspect symptoms are related to irritable bowel syndrome. In 2015 underwent workup for biliary etiology which was negative. Symptoms are suggestive of IBS given alternating cramping, diarrhea, constipation. No red flags (fevers, weight loss, blood in stool). Urine preg negative. UA  unremarkable. Gc/chlamydia pending. Will restart bentyl four times a day and have patient follow up in 1 month to see how she's doing. Patient agreeable.   Note - at end of visit after giving AVS patient brought up headaches. Recommend she follow up for a separate visit to discuss this.   FOLLOW UP: Follow up in 1 for month for IBS.   [place] J. Pollie Meyer, MD [facility]

## 2016-02-25 NOTE — Patient Instructions (Signed)
Start the bentyl. Four times a day Follow up with me in 1 month Sooner if needed or if worsening  Be well, Dr. Elzie Rings del intestino irritable en los adultos (Irritable Bowel Syndrome, Adult) El sndrome del intestino irritable (SII) no es Doctor, hospital. Se trata de una serie de sntomas que afectan a los rganos que intervienen en la digestin (tubo gastrointestinal o tubo digestivo).  Para regular la [employer] en que funciona el tubo digestivo, el cuerpo enva seales entre los intestinos y Secretary/administrator. Si tiene SII, puede haber un problema con estas seales. Como consecuencia, el tubo digestivo no funciona con normalidad. Los intestinos pueden volverse ms sensibles y Publishing rights manager de forma exagerada a determinadas cosas. Esto ocurre especialmente cuando se consumen determinados alimentos o cuando se est bajo estrs.  Hay cuatro tipos de sndrome del intestino irritable que pueden determinarse en funcin de la consistencia de la materia fecal:   SII con diarrea.  SII con estreimiento.  SII mixto.  SII no tipificado. Es importante saber qu tipo de SII es el que se padece. Algunos tratamientos pueden ser ms tiles para ciertos tipos de SII.  CAUSAS  Se desconoce la causa exacta del SII. FACTORES DE RIESGO Puede tener un riesgo ms alto de tener SII si:  Es mujer.  Es menor de 16XWR.  Tiene antecedentes familiares de SII.  Tiene problemas mentales.  Tuvo una infeccin bacteriana en el tubo digestivo. Blake Divine SNTOMAS  Los sntomas de SII pueden variar de Neomia Dear persona a Educational psychologist. El sntoma principal es el dolor o Environmental health practitioner abdominal. Otros sntomas por lo general incluyen uno o ms de los siguientes:   Diarrea, estreimiento, o ambos.  Hinchazn abdominal o meteorismo.  Sensacin de saciedad o malestar despus de comer poco o una cantidad normal de comida.  Gases frecuentes.  Mucosidad en las heces.  Sensacin de tener ms heces por eliminar  despus de defecar. Los sntomas suelen aparecer y Geneticist, molecular. Pueden estar asociados con estrs, enfermedades psiquitricas o con nada en absoluto.  DIAGNSTICO  No hay un estudio especfico para diagnosticar el SII. El mdico har el diagnstico en funcin de un examen fsico, la historia clnica y los sntomas. Es posible que deban realizarle otros estudios para Sales promotion account executive otras enfermedades que pueden estar causando los sntomas. Estos pueden incluir lo siguiente:   Anlisis de sangre.  Radiografas.  Tomografa computarizada.  Endoscopia y colonoscopia. En este estudio se observa el tubo digestivo con un tubo largo, delgado y flexible. TRATAMIENTO No hay cura para el SII, pero el tratamiento puede ayudar a [employer] sntomas. A menudo, el tratamiento para el SII incluye lo siguiente:   Cambios en la dieta, por ejemplo:  Consumir ms fibra.  Evitar los alimentos que causan sntomas.  Beber ms agua.  Consumir porciones normales de comida de [employer].  Medicamentos. Estos pueden incluir lo siguiente:  Suplementos de fibra si est estreido.  Medicamentos para Landscape architect (antidiarreicos).  Medicamentos para ayudar a Chief Executive Officer musculares del tubo digestivo (antiespasmdicos).  Medicamentos que [employer], como antidepresivos o tranquilizantes.  Terapia.  La psicoterapia puede ayudar con la ansiedad, la depresin u otros problemas de salud mental que pueden empeorar los sntomas de [place].  Reduccin del estrs.  Controlar el estrs puede ayudar a Pharmacologist los sntomas bajo control. INSTRUCCIONES PARA EL CUIDADO EN EL HOGAR   Tome los medicamentos solamente como se lo haya indicado el mdico.  Consuma una dieta  saludable.  Evite los alimentos y las bebidas con Engineer, mining.  Incorpore gradualmente en la dieta una mayor cantidad de cereales integrales, frutas y verduras. Esto puede ser especialmente til si tiene  SII con estreimiento.  Evite los alimentos y las bebidas que intensifican los sntomas. Estos pueden incluir productos lcteos y bebidas con cafena o con gas.  No coma comidas abundantes.  Beba suficiente lquido para Photographer orina clara o de color amarillo plido.  Haga ejercicios regularmente. Pdale al mdico que le recomiende algunas actividades adecuadas para usted.  Concurra a todas las visitas de control como se lo haya indicado el mdico. Esto es importante. SOLICITE ATENCIN MDICA SI:   Engineer, maintenance (IT).  Tiene dificultad o dolor al tragar.  Tiene diarrea que empeora. SOLICITE ATENCIN MDICA DE INMEDIATO SI:   Tiene dolor abdominal intenso y que empeora.  Tiene diarrea y alguno de los siguientes sntomas:  Aparece una erupcin, rigidez en el cuello o dolor de cabeza intensos.  Est irritable, somnoliento o le cuesta despertarse.  Se siente dbil, mareado o tiene mucha sed.  Observa sangre de color rojo brillante en la materia fecal, o las heces son negras y de Geophysicist/field seismologist.  Tiene hinchazn abdominal atpica que es dolorosa.  Vomita continuamente.  Vomita con sangre (hematemesis).  Tiene dolor abdominal y [place].   Esta informacin no tiene Theme park manager el consejo del mdico. Asegrese de hacerle al mdico cualquier pregunta que tenga.   Document Released: 12/12/2005 Document Revised: 01/02/2015 Elsevier Interactive Patient Education [employer]

## 2016-02-25 NOTE — Assessment & Plan Note (Signed)
Suspect symptoms are related to irritable bowel syndrome. In 2015 underwent workup for biliary etiology which was negative. Symptoms are suggestive of IBS given alternating cramping, diarrhea, constipation. No red flags (fevers, weight loss, blood in stool). Urine preg negative. UA unremarkable. Gc/chlamydia pending. Will restart bentyl four times a day and have patient follow up in 1 month to see how she's doing. Patient agreeable.

## 2016-02-26 LAB — CERVICOVAGINAL ANCILLARY ONLY
CHLAMYDIA, DNA PROBE: NEGATIVE
NEISSERIA GONORRHEA: NEGATIVE
Trichomonas: NEGATIVE

## 2016-02-29 ENCOUNTER — Other Ambulatory Visit: Payer: Self-pay | Admitting: Family Medicine

## 2016-02-29 ENCOUNTER — Other Ambulatory Visit: Payer: Self-pay

## 2016-02-29 DIAGNOSIS — Z862 Personal history of diseases of the blood and blood-forming organs and certain disorders involving the immune mechanism: Secondary | ICD-10-CM

## 2016-02-29 DIAGNOSIS — Z13220 Encounter for screening for lipoid disorders: Secondary | ICD-10-CM

## 2016-02-29 LAB — COMPREHENSIVE METABOLIC PANEL
ALT: 20 U/L (ref 6–29)
AST: 16 U/L (ref 10–30)
Albumin: 4.1 g/dL (ref 3.6–5.1)
Alkaline Phosphatase: 74 U/L (ref 33–115)
BUN: 13 mg/dL (ref 7–25)
CO2: 23 mmol/L (ref 20–31)
CREATININE: 0.58 mg/dL (ref 0.50–1.10)
Calcium: 8.9 mg/dL (ref 8.6–10.2)
Chloride: 104 mmol/L (ref 98–110)
GLUCOSE: 100 mg/dL — AB (ref 65–99)
POTASSIUM: 4.6 mmol/L (ref 3.5–5.3)
Sodium: 140 mmol/L (ref 135–146)
Total Bilirubin: 1.1 mg/dL (ref 0.2–1.2)
Total Protein: 6.8 g/dL (ref 6.1–8.1)

## 2016-02-29 LAB — LIPID PANEL
Cholesterol: 142 mg/dL (ref 125–200)
HDL: 27 mg/dL — ABNORMAL LOW (ref >=46)
LDL CALC: 81 mg/dL (ref <130)
TRIGLYCERIDES: 168 mg/dL — AB (ref <150)
Total CHOL/HDL Ratio: 5.3 Ratio — ABNORMAL HIGH (ref <=5.0)
VLDL: 34 mg/dL — AB (ref <30)

## 2016-02-29 LAB — CBC
HEMATOCRIT: 39.6 % (ref 36.0–46.0)
Hemoglobin: 13.4 g/dL (ref 12.0–15.0)
MCH: 28.4 pg (ref 26.0–34.0)
MCHC: 33.8 g/dL (ref 30.0–36.0)
MCV: 83.9 fL (ref 78.0–100.0)
MPV: 10 fL (ref 8.6–12.4)
PLATELETS: 313 10*3/uL (ref 150–400)
RBC: 4.72 MIL/uL (ref 3.87–5.11)
RDW: 12.7 % (ref 11.5–15.5)
WBC: 6 10*3/uL (ref 4.0–10.5)

## 2016-02-29 NOTE — Progress Notes (Signed)
Cmp, cbc and flp done today [name]

## 2016-03-21 ENCOUNTER — Ambulatory Visit (INDEPENDENT_AMBULATORY_CARE_PROVIDER_SITE_OTHER): Payer: Self-pay | Admitting: Family Medicine

## 2016-03-21 ENCOUNTER — Encounter: Payer: Self-pay | Admitting: Family Medicine

## 2016-03-21 VITALS — BP 111/63 | HR 89 | Temp 99.9°F | Ht 62.0 in | Wt 190.0 lb

## 2016-03-21 DIAGNOSIS — K589 Irritable bowel syndrome without diarrhea: Secondary | ICD-10-CM

## 2016-03-21 MED ORDER — DICYCLOMINE HCL 20 MG PO TABS: 20.0000 mg | ORAL_TABLET | Freq: Three times a day (TID) | ORAL | Status: DC

## 2016-03-21 NOTE — Patient Instructions (Signed)
Continue bentyl. Follow up in 3 months  Infeccin del tracto respiratorio superior, adultos (Upper Respiratory Infection, Adult) La mayora de las infecciones del tracto respiratorio superior son infecciones virales de las vas que llevan el aire a los pulmones. Un infeccin del tracto respiratorio superior afecta la nariz, la garganta y las vas respiratorias superiores. El tipo ms frecuente de infeccin del tracto respiratorio superior es la nasofaringitis, que habitualmente se conoce como "resfro comn". Las infecciones del tracto respiratorio superior siguen su curso y por lo general se curan solas. En la International Business Machinesmayora de los casos, la infeccin del tracto respiratorio superior no requiere atencin Sullivanmdica, Biomedical engineerpero a veces, despus de una infeccin viral, puede surgir una infeccin bacteriana en las vas respiratorias superiores. Esto se conoce como infeccin secundaria. Las infecciones sinusales y en el odo medio son tipos frecuentes de infecciones secundarias en el tracto respiratorio superior. La neumona bacteriana tambin puede complicar un cuadro de infeccin del tracto respiratorio superior. Este tipo de infeccin puede empeorar el asma y la enfermedad pulmonar obstructiva crnica (EPOC). En algunos casos, estas complicaciones pueden requerir atencin mdica de emergencia y poner en peligro la vida.  CAUSAS Casi todas las infecciones del tracto respiratorio superior se deben a los virus. Un virus es un tipo de microbio que puede contagiarse de Neomia Dearuna persona a Educational psychologistotra.  FACTORES DE RIESGO Puede estar en riesgo de sufrir una infeccin del tracto respiratorio superior si:   Fuma.  Tiene una enfermedad pulmonar o cardaca crnica.  Tiene debilitado el sistema de defensa (inmunitario) del cuerpo.  Es [address] [place] [place] Entrancemuy joven o de edad muy Reinholdsavanzada.  Tiene asma o alergias nasales.  Trabaja en reas donde hay mucha gente o poca ventilacin.  Rudi Cocorabaja en una escuela o en un centro de atencin mdica. SIGNOS Y  SNTOMAS  Habitualmente, los sntomas aparecen de 2a 3das despus de entrar en contacto con el virus del resfro. La mayora de las infecciones virales en el tracto respiratorio superior duran de 7a 10das. Sin embargo, las infecciones virales en el tracto respiratorio superior a causa del virus de la gripe pueden durar de 14a 18das y, habitualmente, son ms graves. Entre los sntomas se pueden incluir los siguientes:   Secrecin o congestin nasal.  Estornudos.  Tos.  Dolor de Advertising copywritergarganta.  Dolor de Turkmenistancabeza.  Fatiga.  Grant RutsFiebre.  Prdida del apetito.  Dolor en la frente, detrs de los ojos y por encima de los pmulos (dolor sinusal).  Dolores musculares. DIAGNSTICO  El mdico puede diagnosticar una infeccin del tracto respiratorio superior mediante los siguientes estudios:  Examen fsico.  Pruebas para verificar si los sntomas no se deben a otra afeccin, por ejemplo:  Faringitis estreptoccica.  Sinusitis.  Neumona.  Asma. TRATAMIENTO  Esta infeccin desaparece sola, con el tiempo. No puede curarse con medicamentos, pero a menudo se prescriben para aliviar los sntomas. Los medicamentos pueden ser tiles para lo siguiente:  Personal assistantBajar la fiebre.  Reducir la tos.  Aliviar la congestin nasal. INSTRUCCIONES PARA EL CUIDADO EN EL HOGAR   Tome los medicamentos solamente como se lo haya indicado el mdico.  A fin de Engineer, materialsaliviar el dolor de garganta, haga grgaras con solucin salina templada o consuma caramelos para la tos, como se lo haya indicado el mdico.  Use un humidificador de vapor clido o inhale el vapor de la ducha para aumentar la humedad del aire. Esto facilitar la respiracin.  Beba suficiente lquido para Photographermantener la orina clara o de color amarillo plido.  Consuma sopas y otros caldos  transparentes, y [employer].  Descanse todo lo que sea necesario.  Regrese al Aleen Campi cuando la temperatura se le haya normalizado o cuando el mdico lo autorice.  Es posible que deba quedarse en su casa durante un tiempo prolongado, para no infectar a los dems. Tambin puede usar un barbijo y lavarse las manos con cuidado para Transport planner propagacin del virus.  Aumente el uso del inhalador si tiene asma.  No consuma ningn producto que contenga tabaco, lo que incluye cigarrillos, tabaco de Theatre manager o Administrator, Civil Service. Si necesita ayuda para dejar de fumar, consulte al [employer]. PREVENCIN  La mejor manera de protegerse de un resfro es mantener una higiene [place].   Evite el contacto oral o fsico con personas que tengan sntomas de resfro.  En caso de contacto, lvese las manos con frecuencia. No hay pruebas claras de que la vitaminaC, la vitaminaE, la equincea o el ejercicio reduzcan la probabilidad de Primary school teacher un resfro. Sin embargo, siempre se recomienda Insurance account manager, hacer ejercicio y Engineering geologist.  SOLICITE ATENCIN MDICA SI:   Su estado empeora en lugar de mejorar.  Los medicamentos no Estate agent.  Tiene escalofros.  La sensacin de falta de aire empeora.  Tiene mucosidad marrn o roja.  Tiene secrecin nasal amarilla o marrn.  Le duele la cara, especialmente al inclinarse hacia adelante.  Tiene fiebre.  Tiene los ganglios del cuello hinchados.  Siente dolor al tragar.  Tiene zonas blancas en la parte de atrs de la garganta. SOLICITE ATENCIN MDICA DE INMEDIATO SI:   Tiene sntomas intensos o persistentes de:  Dolor de [place].  Dolor de odos.  Dolor sinusal.  Dolor en el pecho.  Tiene enfermedad pulmonar crnica y cualquiera de estos sntomas:  Sibilancias.  Tos prolongada.  Tos con sangre.  Cambio en la mucosidad habitual.  Presenta rigidez en el cuello.  Tiene cambios en:  La visin.  La audicin.  El pensamiento.  El [place] de nimo. ASEGRESE DE QUE:   Comprende estas instrucciones.  Controlar su afeccin.  Recibir ayuda de inmediato si no mejora o  si empeora.   Esta informacin no tiene Theme park manager el consejo del mdico. Asegrese de hacerle al mdico cualquier pregunta que tenga.   Document Released: 09/21/2005 Document Revised: 04/28/2015 Elsevier Interactive Patient Education [employer]

## 2016-03-21 NOTE — Assessment & Plan Note (Signed)
Improved with bentyl. Continue three times daily (since 4x/day made her constipated). Follow up in 3 months.

## 2016-03-21 NOTE — Progress Notes (Signed)
Date of Visit: 03/21/2016   HPI:  Patient presents for follow up.   IBS - last visit we started on bentyl four times a day for IBS symptoms. [name] reports that since starting that medication, she's been feeling much better. Typically takes 3-4 times a day though has noticed that when she takes it 4x/day she gets constipated. No ongoing fevers or blood in stool. Wants to continue the medication.  URI - since Saturday has had sore throat, runny nose, headache. No cough yet. Might have had a fever last night but didn't take her temperature. Her kids have also been sick. Has taken ibuprofen for her throat.  ROS: See HPI.  PMFSH: history of depression, IBS, obesity  PHYSICAL EXAM: BP 111/63 mmHg  Pulse 89  Temp(Src) 99.9 F (37.7 C) (Oral)  Ht 5\' 2"  (1.575 m)  Wt 190 lb (86.183 kg)  BMI 34.74 kg/m2  LMP 02/07/2016 Gen: NAD, pleasant, cooperative HEENT: normocephalic, atraumatic, moist mucous membranes, oropharynx slightly erythematous but no exudates. No anterior cervical or supraclavicular lymphadenopathy.. Tympanic membranes clear bilaterally Heart: regular rate and rhythm, no murmur Lungs: clear to auscultation bilaterally, normal work of breathing  Neuro: alert, grossly nonfocal, speech normal Ext: atraumatic Abdomen: soft, nontender to palpation, no masses or organomegaly  ASSESSMENT/PLAN:  Irritable bowel disease Improved with bentyl. Continue three times daily (since 4x/day made her constipated). Follow up in 3 months.   Viral URI - recommend supportive care with cough drops, hydration, honey, ibuprofen. Follow up if not improved next week.  FOLLOW UP: Follow up in 3 months for IBS  [name] J. Pollie MeyerMcIntyre, MD [facility]

## 2016-11-10 ENCOUNTER — Other Ambulatory Visit: Payer: Self-pay | Admitting: *Deleted

## 2016-11-10 NOTE — Telephone Encounter (Signed)
Refill request for 90 day supply.  Martin, Tamika L, RN  

## 2016-11-14 MED ORDER — DICYCLOMINE HCL 20 MG PO TABS: 20.0000 mg | ORAL_TABLET | Freq: Three times a day (TID) | ORAL | 0 refills | Status: DC

## 2016-12-21 ENCOUNTER — Other Ambulatory Visit: Payer: Self-pay | Admitting: *Deleted

## 2016-12-21 NOTE — Telephone Encounter (Signed)
Requesting 90 day supply of dicyclomine 20 mg

## 2016-12-22 MED ORDER — DICYCLOMINE HCL 20 MG PO TABS: 20.0000 mg | ORAL_TABLET | Freq: Three times a day (TID) | ORAL | 0 refills | Status: DC

## 2017-10-05 LAB — OB RESULTS CONSOLE VARICELLA ZOSTER ANTIBODY, IGG: Varicella: IMMUNE

## 2017-10-05 LAB — OB RESULTS CONSOLE RUBELLA ANTIBODY, IGM: Rubella: IMMUNE

## 2017-10-05 LAB — CYSTIC FIBROSIS DIAGNOSTIC STUDY: INTERPRETATION-CFDNA: NEGATIVE

## 2017-10-05 LAB — OB RESULTS CONSOLE HGB/HCT, BLOOD
HEMATOCRIT: 35
HEMOGLOBIN: 12

## 2017-10-05 LAB — GLUCOSE TOLERANCE, 1 HOUR: Glucose 1 Hour: 153

## 2017-10-05 LAB — OB RESULTS CONSOLE RPR: RPR: NONREACTIVE

## 2017-10-05 LAB — OB RESULTS CONSOLE GC/CHLAMYDIA
CHLAMYDIA, DNA PROBE: NEGATIVE
GC PROBE AMP, GENITAL: NEGATIVE

## 2017-10-05 LAB — OB RESULTS CONSOLE PLATELET COUNT: PLATELETS: 296

## 2017-10-05 LAB — OB RESULTS CONSOLE HEPATITIS B SURFACE ANTIGEN: Hepatitis B Surface Ag: NEGATIVE

## 2017-10-05 LAB — CULTURE, OB URINE: Urine Culture, OB: NO GROWTH

## 2017-10-05 LAB — OB RESULTS CONSOLE HIV ANTIBODY (ROUTINE TESTING): HIV: NONREACTIVE

## 2017-10-05 LAB — SICKLE CELL SCREEN: SICKLE CELL SCREEN: NORMAL

## 2017-10-05 LAB — CYTOLOGY - PAP: Pap: NEGATIVE

## 2017-10-10 LAB — GLUCOSE, 3 HOUR

## 2017-10-23 ENCOUNTER — Encounter: Payer: Self-pay | Admitting: *Deleted

## 2017-10-24 ENCOUNTER — Encounter: Payer: Medicaid Other | Attending: Obstetrics and Gynecology | Admitting: *Deleted

## 2017-10-24 ENCOUNTER — Ambulatory Visit: Payer: Self-pay | Admitting: *Deleted

## 2017-10-24 DIAGNOSIS — O24419 Gestational diabetes mellitus in pregnancy, unspecified control: Secondary | ICD-10-CM | POA: Insufficient documentation

## 2017-10-24 DIAGNOSIS — Z719 Counseling, unspecified: Secondary | ICD-10-CM | POA: Insufficient documentation

## 2017-10-24 DIAGNOSIS — R7309 Other abnormal glucose: Secondary | ICD-10-CM

## 2017-10-24 NOTE — Progress Notes (Signed)
  Patient was seen on 10/24/2017 for Gestational Diabetes self-management . She is here with Spanish interpretor. EDD 04/05/2018. She states history of GDM with her last pregnancy, 7 years ago. She states she managed it with healthy eating and walking regularly. She states she works for a [place] from 7 - 3:30 PM Monday through Friday. Diet history obtained, eating habits appear appropriate most of the time. She states she is avoiding fruit juices and other sweet beverages. The following learning objectives were met by the patient :   States the definition of Gestational Diabetes  States why dietary management is important in controlling blood glucose  Describes the effects of carbohydrates on blood glucose levels  Demonstrates ability to create a balanced meal plan  Demonstrates carbohydrate counting   States when to check blood glucose levels  Demonstrates proper blood glucose monitoring techniques  States the effect of stress and exercise on blood glucose levels  States the importance of limiting caffeine and abstaining from alcohol and smoking  Plan:  Aim for 3 Carb Choices per meal (45 grams) +/- 1 either way  Aim for 1-2 Carbs per snack Begin reading food labels for Total Carbohydrate of foods Consider  increasing your activity level by walking or other activity daily as tolerated Begin checking BG before breakfast and 2 hours after first bite of breakfast, lunch and dinner as directed by MD  Bring Log Book to every medical appointment   Take medication if directed by MD  Blood glucose monitor given: True Track Lot # [id number] Exp: 12/16/2019 Blood glucose reading: 155 mg/dl 2 hours after breakfast of bread and milk  Patient instructed to monitor glucose levels: FBS: 60 - 95 mg/dl 2 hour: <120 mg/dl  Patient received the following handouts: in Spanish  Nutrition Diabetes and Pregnancy  Carbohydrate Counting List  Patient will be seen for  follow-up as needed.

## 2017-10-30 ENCOUNTER — Encounter: Payer: Self-pay | Admitting: Obstetrics & Gynecology

## 2017-10-30 ENCOUNTER — Ambulatory Visit (INDEPENDENT_AMBULATORY_CARE_PROVIDER_SITE_OTHER): Payer: Medicaid Other | Admitting: Obstetrics & Gynecology

## 2017-10-30 VITALS — BP 100/66 | HR 84 | Wt 200.7 lb

## 2017-10-30 DIAGNOSIS — O34219 Maternal care for unspecified type scar from previous cesarean delivery: Secondary | ICD-10-CM | POA: Diagnosis not present

## 2017-10-30 DIAGNOSIS — O24912 Unspecified diabetes mellitus in pregnancy, second trimester: Secondary | ICD-10-CM

## 2017-10-30 DIAGNOSIS — O0992 Supervision of high risk pregnancy, unspecified, second trimester: Secondary | ICD-10-CM

## 2017-10-30 DIAGNOSIS — O0990 Supervision of high risk pregnancy, unspecified, unspecified trimester: Secondary | ICD-10-CM | POA: Insufficient documentation

## 2017-10-30 DIAGNOSIS — Z98891 History of uterine scar from previous surgery: Secondary | ICD-10-CM | POA: Insufficient documentation

## 2017-10-30 DIAGNOSIS — O24919 Unspecified diabetes mellitus in pregnancy, unspecified trimester: Secondary | ICD-10-CM | POA: Insufficient documentation

## 2017-10-30 DIAGNOSIS — O09522 Supervision of elderly multigravida, second trimester: Secondary | ICD-10-CM | POA: Diagnosis not present

## 2017-10-30 DIAGNOSIS — O09529 Supervision of elderly multigravida, unspecified trimester: Secondary | ICD-10-CM | POA: Insufficient documentation

## 2017-10-30 LAB — POCT URINALYSIS DIP (DEVICE)
BILIRUBIN URINE: NEGATIVE
Glucose, UA: NEGATIVE mg/dL
HGB URINE DIPSTICK: NEGATIVE
KETONES UR: NEGATIVE mg/dL
Leukocytes, UA: NEGATIVE
Nitrite: NEGATIVE
PH: 6.5 (ref 5.0–8.0)
Protein, ur: NEGATIVE mg/dL
Specific Gravity, Urine: 1.02 (ref 1.005–1.030)
Urobilinogen, UA: 0.2 mg/dL (ref 0.0–1.0)

## 2017-10-30 MED ORDER — ASPIRIN EC 81 MG PO TBEC: 81.0000 mg | DELAYED_RELEASE_TABLET | Freq: Every day | ORAL | 2 refills | Status: DC

## 2017-10-30 MED ORDER — GLYBURIDE 2.5 MG PO TABS: 2.5000 mg | ORAL_TABLET | Freq: Every day | ORAL | 6 refills | Status: DC

## 2017-10-30 NOTE — Progress Notes (Signed)
Video Interpreter [phone] New ob packet given

## 2017-10-30 NOTE — Patient Instructions (Signed)
Diabetes mellitus tipo1 o tipo2 durante el embarazo, cuidados personales (Type 1 or Type 2 Diabetes Mellitus During Pregnancy, Self Care) El cuidado personal durante el embarazo cuando se tiene diabetes tipo1 (diabetes mellitus tipo1) o diabetes tipo2 (diabetes mellitus tipo2) implica mantener el nivel de glucosa en la sangre bajo control a travs del equilibrio de los siguientes factores:  Nutricin.  Actividad fsica.  Cambios en el estilo de vida.  Insulina o medicamentos, si es necesario.  El apoyo del equipo de mdicos y de Producer, television/film/video. La siguiente informacin explica lo que debe saber para mantener la diabetes bajo control en su casa durante el embarazo. QU DEBO SABER PARA MANTENER LA GLUCEMIA BAJO CONTROL?  Contrlese la glucemia todos los [place], con la frecuencia que le haya indicado el mdico.  Comunquese con el mdico si la glucemia est por encima del nivel ideal en 2anlisis seguidos.  Hgase controlar la hemoglobinaA1c al [employer] veces al ao o con la frecuencia que le haya indicado el mdico. El mdico establecer los objetivos personalizados de su tratamiento. Generalmente, el objetivo del tratamiento es [employer] siguientes niveles de glucemia durante el embarazo:  Despus de no comer (despus de Patent attorney) durante 8horas: igual o menor que '95mg'$ /dl (5,36mol/l).  Despus de las comidas (posprandial): ? Una hora despus de una comida: igual o menor que '140mg'$ /dl (7,839ml/l). ? Dos horas despus de una comida: igual o menor que '120mg'$ /dl (6,65m30m/l).  Nivel de A1c: del 6% al 6,5%. QU DEBO SABER SOBRE LA HIPAbbevilleu es la hiperglucemia? La hiperglucemia, tambin llamada glucemia alta, ocurre cuando el nivel de glucosa en la sangre es muyBelle Havensegrese de conRyland Groupgnos tempranos de hiperglucemia, por ejemplo:  Aumento de la sed.  Hambre.  Mucho cansancio.  Necesidad de oriGarment/textile technologistn mayor frecuencia que lo  habitual.  Visin borrosa. Qu es la hipoglucemia? La hipoglucemia, tambin llamada glucemia baja, ocurre cuando el nivel sanguneo de glucosa es igual o menor que '70mg'$ /dl (3,9mm43ml). El riesgo de hipoglucemia aumenta durante o despus de realizar actividad fsica, mienDoctor Phillipsrme, durante una enfermedad o si se saltea comidas o ayuna. Es impoPhotographer sntomas de la hipoglucemia y tratarla de inmediato. Lleve siempre consigo una colacin de 15gramos hidratos de carbono de accin rpida para tratar la glucemia baja. Los familiares y los amigos cercanos tambin deben conoRyland Groupomas, y comprender cmo tratar la hipoglucemia, en caso de que usted no pueda tratarse a s misma. Cules son los sntomas de la hipoglucemia? Los sntomas de hipoglucemia pueden incluir los siguientes:  HambHackettstownnsiedad.  Sudoracin y piel hmeda.  Confusin.  Mareos o sensacin de desvanecimiento.  Somnolencia.  Nuseas.  Aumento de la frecuencia cardaca.  Dolor de cabeNetherlandsisin borrosa.  Convulsiones.  Pesadillas.  Hormigueo o adormecimiento alrededor de lExxon Mobil Corporations labios o la lengHopeambios en el habla.  Disminucin de la capacidad de concentracin.  Cambios en la coordinacin.  Sueo agitado.  Temblores o sacudidas.  Desmayos.  Irritabilidad. Cmo se trata la hipoglucemia? Si est alerta y puede tragar con seguridad, siga la regla de 15/15, que consiste en lo siguiente:  Tome 15gramos de hidratos de carbono de accin rpida. Las opciones de accin rpida incluyen lo siguiente: ? 1pomo de glucosa en gel. ? 3comprimidos de glucosa. ? 6 a 8unidades de caramelos duros. ? 4onzas (120ml59m jugo de frutas. ? 4onzas (120ml)73mgaseosa comn (no diettica).  Contrlese la glucemia 15minu62mdespus de ingerir el  hidrato de carbono.  Si este nuevo nivel de glucosa en la sangre an es de '70mg'$ /dl o Garment/textile technologist (3,19mol/l), vuelva a ingerir 15gramos de un  hidrato de carbono.  Si la glucemia no aumenta por encima de '70mg'$ /dl (3,962ml/l) despus de 3intentos, solicite ayuda mdica de emergencia.  Ingiera una comida o una colacin en el transcurso de 1hora despus de que la glucemia se haya normalizado. Cmo se trata la hipoglucemia grave? La hipoglucemia grave ocurre cuando la glucemia es igual o menor que '54mg'$ /dl (22m32m/l). La hipoglucemia grave es unaEngineer, maintenance (IT)o espere hasta que los sntomas desaparezcan. Solicite atencin mdica de inmediato. Comunquese con el servicio de emergencias de su localidad (911 en los [place]). No conduzca por sus propios medios hasPrincipal Financiali tiene hipoglucemia grave y no puede ingerir alimentos o bebidas, tal vez deba aplicarse una inyeccin de glucagn. Un familiar o un amigo cercano deben aprender a controlarle la glucemia y a aplicarle una inyeccin de glucagn. Pregntele al mdico si debe tener disponible un kit de inyecciones de glucagn de emeFreight forwarders posible que la hipoglucemia grave deba tratarse en un hospital. El tratamiento puede incluir la administracin de glucosa a travs de una va intravenosa (IV). Tambin puede necesitar un tratamiento para tratar la afeccin que est causando la hipoglucemia. TENER DIABETES PUEDE PONERME EN RIESGO DE SUFRIR OTRAS AFECCIONES? Tener diabetes puede ponerla en riesgo de sufrir otras afecciones a largo plazo (crnicas), como cardiopata coronaria y enfermedades renales. El mdico puede recetar medicamentos para evitar las complicaciones causadas por la diabetes. Estos medicamentos pueden incluir los siguientes:  Aspirina.  Medicamentos para bajAdvertising copywriterMedicamentos para controlar la presin arterial. QU OTRAS COSAS PUEDO HACER PARA CONTROLAR LA DIABETES? Tome los medicamentos para la diabetes como se lo hayan indicado  Si el mdico le recet insulina o medicamentos para la diabetes, tmelos todos losWendoverNo se quede sin  insulina ni cualquier otro medicamento para la diabetes que tome. Planifique con antelacin para tenerlos siempre a su disposicin.  Si usaCanadasulina, ajuste las dosis en funcin de la cantidad de actividad fsica que realiza y de los alimentos que consume. El mdico le indicar cmo ajustar las dosis. El mdico puede recomendarle que tome una aspirina de dosis baja ('81mg'$ ) diariamente a fin de ayudar a evitar la hipertensin arterial durante el embarazo (preeclampsia o eclampsia). Puede estar en riesgo de padecer preeclampsia o eclampsia si:  Tuvo cualquiera de los siguientes cuadros durante un embarazo previo: ? Preeclampsia o eclampsia. ? Un ritmo de crecimiento fetal que fue ms lento que lo normal. ? Un parto prematuro. ? Separacin de la placenta del tero (desprendimiento placentario). ? Muerte fetal.  Est embarazada de ms de un beb.  Padece otras afecciones, como hipertensin arterial o una enfermedad autoinmunitaria. Opte por opciones de alimentos saludables Lo que come y bebe incide en la glucemia y las dosis de insDavisacer buenas elecciones ayuda a mantener la diabetes bajo control y a eviTax inspector salud. Un plan de alimentacin saludable incluye consumir protenas magras, hidratos de carbono complejos, frutas y verduras frescas, productos lcteos con bajo contenido de graDjiboutigrasas saludables. Programe una cita con un especialista en alimentacin y nutricin (nutricionista certificado) para que la ayude a armar un plan de alimentacin adecuado para usted. Asegrese de lo siguiente:  Siga las indicaciones del mdico respecto de las restricciones para las comidas o las bebidas.  Beba suficiente lquido para manConsulting civil engineerina claFort Washington  color amarillo plido.  Ingiera colaciones saludables entre comidas nutritivas.  Haga un seguimiento de los hidratos de carbono que consume. Para hacerlo, lea las etiquetas de informacin nutricional y aprenda cules son los  tamaos de las porciones estndar de los alimentos.  Siga el plan para los das de enfermedad cuando no pueda comer o beber normalmente. Arme este plan por adelantado con el [place] al menos 84mnutos de actividad fsica por da, o tanta actividad fsica como le recomiende el mdico durante el eTh[place]  Si comienza un ejercicio o una actividad nuevos, trabaje con el mdico para ajustar la iWesterville los medicamentos o la ingesta de comidas segn sea necesario. Opte por un estilo de vida saludable  No consuma ningn producto que contenga tabaco, lo que incluye cigarrillos, tabaco de mHigher education careers advisery cPsychologist, sport and exercise Si necesita ayuda para dejar de fumar, consulte al mdico.  No consuma alcohol.  Aprenda a mEngineer, maintenance (IT) Si necesita ayuda para lograrlo, consulte a su mdico. Cuide su cuerpo  Mantngase al da con las vacunas.  Programe una cita para un examen ocular durante el primer trimestre del embarazo o como se lo haya indicado el mdico.  Examnese la piel y los pies en busca de cortes, moretones, enrojecimiento, ampollas o llagas. Programe una cita para que el mViacomcontrole los pies una vez por ao.  Cepllese los dientes y lLaguna Niguely use hilo dental al menos una vez por da. Visite al dentista al menos una vez cada 663mes.  Mantenga un peso saTax adviserInstrucciones generales  ToDelphie venta libre y los recetados solamente como se lo haya indicado el mdico.  Hable con el mdico sobre el riesgo de tener hipertensin arterial durante el embarazo (preeclampsia o eclampsia).  Comparta su plan de control de la diabetes con sus compaeros de trabajo y de laCytogeneticisty con las personas con las que coArab Contrlese las cetonas en la orina cuando est enferma y coSully Squaree lo haya indicado el mdico.  Lleve una tarjeta de alerta mdica o use un brazalete o medalla de alerta  mdica.  Pregntele al mdico: ? Debo reunirme con unRadio broadcast assistantara el cuidado de la diabetes? ? Dnde puedo encontrar un grupo de apoyo para personas diabticas?  Asista a todas las visitas de control durante el embarazo (prenatal) y despus del parto (posnatal) como se lo haya indicado el mdico. Esto es importante. DNDE ENCONTRAR MS INFORMACIN: Para obtener ms informacin sobre la diabetes, visite los siguientes sitios:  Asociacin Americana de la Diabetes (American Diabetes Association, ADA): www.diabetes.org  Asociacin Norteamericana de Instructores para el Cuidado de la Diabetes (American Association of Diabetes Educators, AADE): https://www.diabeteseducator.org/patient-resources Esta informacin no tiene coMarine scientistl consejo del mdico. Asegrese de hacerle al mdico cualquier pregunta que tenga. Document Released: 04/04/2016 Document Revised: 04/04/2016 Document Reviewed: 01/15/2016 Elsevier Interactive Patient Education  20[employer]

## 2017-10-30 NOTE — Progress Notes (Signed)
[facility]15.

## 2017-10-30 NOTE — Progress Notes (Signed)
  [facility]    Carolyn Mendez is a [id number] 467w2d being seen today for her first obstetrical visit.  Her obstetrical history is significant for advanced maternal age and diabetes. Patient does intend to breast feed. Pregnancy history fully reviewed.  Patient reports decreased appetite.  Vitals:   10/30/17 0821  BP: 100/66  Pulse: 84  Weight: 200 lb 11.2 oz (91 kg)    HISTORY: OB History  Gravida Para Term Preterm AB Living  6 4 4  0 1 4  SAB TAB Ectopic Multiple Live Births      1 0 4    # Outcome Date GA Lbr Len/2nd Weight Sex Delivery Anes PTL Lv  6 Current           5 Term 06/18/10   9 lb (4.082 kg) M VBAC   LIV  4 Term 04/16/05 5362w0d  8 lb 15 oz (4.054 kg) M VBAC   LIV  3 Ectopic 10/30/04          2 Term 02/01/01 7653w0d  8 lb 13 oz (3.997 kg) F CS-LTranv   LIV     Complications: Breech presentation at birth  1 Term 04/16/99 6785w1d  8 lb 14 oz (4.026 kg) M Vag-Spont   LIV     Past Medical History:  Diagnosis Date  . Diabetes mellitus without complication (HCC)   . GERD (gastroesophageal reflux disease)    Past Surgical History:  Procedure Laterality Date  . CESAREAN SECTION     Family History  Problem Relation Age of Onset  . Hyperlipidemia Mother   . Kidney disease Mother   . Hyperlipidemia Sister   . Kidney disease Sister   . Osteoporosis Sister      Exam    Uterus:     Pelvic Exam:                                    Skin: normal coloration and turgor, no rashes    Neurologic: oriented, normal mood   Extremities: normal strength, tone, and muscle mass   HEENT PERRLA   Mouth/Teeth mucous membranes moist, pharynx normal without lesions and dental hygiene good   Neck supple   Cardiovascular: regular rate and rhythm   Respiratory:  appears well, vitals normal, no respiratory distress, acyanotic, normal RR   Abdomen: soft, non-tender; bowel sounds normal; no masses,  no organomegaly   Urinary:        Assessment:    Pregnancy: [id number] Patient Active Problem List   Diagnosis Date Noted  . Supervision of high risk pregnancy, antepartum 10/30/2017  . Diabetes mellitus complicating pregnancy, antepartum 10/30/2017  . Previous cesarean delivery, antepartum 10/30/2017  . AMA (advanced maternal age) multigravida 35+ 10/30/2017  . Irritable bowel disease 02/25/2016  . Sty, internal 08/21/2014  . Throat pain 03/24/2014  . RUQ pain 01/29/2014  . Depression 09/24/2013  . Gastritis 09/24/2013  . OBESITY 01/15/2009        Plan:     Initial labs drawn. Prenatal vitamins. Problem list reviewed and updated. Genetic Screening discussed Quad Screen: ordered.  Ultrasound discussed; fetal survey: ordered.  Follow up in 2 weeks. 50% of 30 min visit spent on counseling and coordination of care.  Start glyburide for elevated FBS above 95 and PP to 177   Scheryl DarterJames Arnold 10/30/2017

## 2017-11-01 LAB — AFP TETRA
DIA Mom Value: 1.11
DIA VALUE (EIA): 155.32 pg/mL
DSR (BY AGE) 1 IN: 75
DSR (SECOND TRIMESTER) 1 IN: 661
Gestational Age: 17.3 WEEKS
MSAFP Mom: 1.45
MSAFP: 47.2 ng/mL
MSHCG MOM: 1.36
MSHCG: 33903 m[IU]/mL
Maternal Age At EDD: 40.6 yr
OSB RISK: 3133
T18 (By Age): 1:290 {titer}
TEST RESULTS AFP: NEGATIVE
WEIGHT: 200 [lb_av]
uE3 Mom: 0.98
uE3 Value: 0.97 ng/mL

## 2017-11-08 ENCOUNTER — Encounter (HOSPITAL_COMMUNITY): Payer: Self-pay | Admitting: Obstetrics & Gynecology

## 2017-11-13 ENCOUNTER — Encounter: Payer: Self-pay | Admitting: Obstetrics and Gynecology

## 2017-11-13 ENCOUNTER — Ambulatory Visit (INDEPENDENT_AMBULATORY_CARE_PROVIDER_SITE_OTHER): Payer: Medicaid Other | Admitting: Obstetrics and Gynecology

## 2017-11-13 VITALS — BP 118/67 | HR 74 | Wt 202.0 lb

## 2017-11-13 DIAGNOSIS — O34219 Maternal care for unspecified type scar from previous cesarean delivery: Secondary | ICD-10-CM | POA: Diagnosis not present

## 2017-11-13 DIAGNOSIS — O24919 Unspecified diabetes mellitus in pregnancy, unspecified trimester: Secondary | ICD-10-CM

## 2017-11-13 DIAGNOSIS — O24912 Unspecified diabetes mellitus in pregnancy, second trimester: Secondary | ICD-10-CM | POA: Diagnosis not present

## 2017-11-13 DIAGNOSIS — O0992 Supervision of high risk pregnancy, unspecified, second trimester: Secondary | ICD-10-CM

## 2017-11-13 DIAGNOSIS — O0990 Supervision of high risk pregnancy, unspecified, unspecified trimester: Secondary | ICD-10-CM

## 2017-11-13 DIAGNOSIS — O09522 Supervision of elderly multigravida, second trimester: Secondary | ICD-10-CM | POA: Diagnosis not present

## 2017-11-13 LAB — POCT URINALYSIS DIP (DEVICE)
Bilirubin Urine: NEGATIVE
GLUCOSE, UA: NEGATIVE mg/dL
KETONES UR: NEGATIVE mg/dL
LEUKOCYTES UA: NEGATIVE
Nitrite: NEGATIVE
Protein, ur: NEGATIVE mg/dL
SPECIFIC GRAVITY, URINE: 1.01 (ref 1.005–1.030)
Urobilinogen, UA: 0.2 mg/dL (ref 0.0–1.0)
pH: 7 (ref 5.0–8.0)

## 2017-11-13 NOTE — Progress Notes (Signed)
   PRENATAL VISIT NOTE  Subjective:  Carolyn Mendez is a 41 y.o. [id number] at 7487w2d being seen today for ongoing prenatal care.  She is currently monitored for the following issues for this high-risk pregnancy and has OBESITY; Depression; Irritable bowel disease; Supervision of high risk pregnancy, antepartum; Diabetes mellitus complicating pregnancy, antepartum; Previous cesarean delivery, antepartum; and AMA (advanced maternal age) multigravida 35+ on their problem list.  Patient reports occasional mild cramping.  Contractions: Not present. Vag. Bleeding: None.  Movement: Present. Denies leaking of fluid.   Glyburide 2.5 QHS - felt sick the first day she took it and took half a pill for the next four days but has been taking full 2.5 for 5 days and feeling fine FG: 84-96 PP: 90-122  The following portions of the patient's history were reviewed and updated as appropriate: allergies, current medications, past family history, past medical history, past social history, past surgical history and problem list. Problem list updated.  Objective:   Vitals:   11/13/17 0810  BP: 118/67  Pulse: 74  Weight: 202 lb (91.6 kg)    Fetal Status: Fetal Heart Rate (bpm): 135   Movement: Present     General:  Alert, oriented and cooperative. Patient is in no acute distress.  Skin: Skin is warm and dry. No rash noted.   Cardiovascular: Normal heart rate noted  Respiratory: Normal respiratory effort, no problems with respiration noted  Abdomen: Soft, gravid, appropriate for gestational age.  Pain/Pressure: Present     Pelvic: Cervical exam deferred        Extremities: Normal range of motion.  Edema: None  Mental Status:  Normal mood and affect. Normal behavior. Normal judgment and thought content.   Assessment and Plan:  Pregnancy: [id number] at 7987w2d  1. Diabetes mellitus complicating pregnancy, antepartum Glyburide 2.5QHS Well controlled, cont this dose Baseline labs collected today Fetal echo  scheduled today  2. Elderly multigravida in second trimester Antenatal testing @ 36 weeks On baby ASA  3. Supervision of high risk pregnancy, antepartum anatomy scheduled 11/15/17  4. Previous cesarean delivery, antepartum For TOLAC, needs to sign papers (2 x VBAC)  Preterm labor symptoms and general obstetric precautions including but not limited to vaginal bleeding, contractions, leaking of fluid and fetal movement were reviewed in detail with the patient. Please refer to After Visit Summary for other counseling recommendations.  Return in about 2 weeks (around 11/27/2017) for OB visit.   Conan BowensKelly M Davis, MD

## 2017-11-13 NOTE — Progress Notes (Signed)
Addendum: Scheduled for 2 d echo with Dr. Elizebeth Brookingotton for 12/06/17 10:00.   Needs to bring $100 to appointment since self pay. Called and notified patient with interpreter Maretta LosBlanca Lindner.

## 2017-11-14 LAB — CBC
HEMATOCRIT: 34 % (ref 34.0–46.6)
HEMOGLOBIN: 11.1 g/dL (ref 11.1–15.9)
MCH: 28.6 pg (ref 26.6–33.0)
MCHC: 32.6 g/dL (ref 31.5–35.7)
MCV: 88 fL (ref 79–97)
Platelets: 302 10*3/uL (ref 150–379)
RBC: 3.88 x10E6/uL (ref 3.77–5.28)
RDW: 13.6 % (ref 12.3–15.4)
WBC: 7.1 10*3/uL (ref 3.4–10.8)

## 2017-11-14 LAB — COMPREHENSIVE METABOLIC PANEL
A/G RATIO: 1.3 (ref 1.2–2.2)
ALBUMIN: 3.6 g/dL (ref 3.5–5.5)
ALT: 14 IU/L (ref 0–32)
AST: 19 IU/L (ref 0–40)
Alkaline Phosphatase: 55 IU/L (ref 39–117)
BUN / CREAT RATIO: 16 (ref 9–23)
BUN: 8 mg/dL (ref 6–24)
Bilirubin Total: 0.4 mg/dL (ref 0.0–1.2)
CALCIUM: 9.1 mg/dL (ref 8.7–10.2)
CO2: 20 mmol/L (ref 20–29)
CREATININE: 0.51 mg/dL — AB (ref 0.57–1.00)
Chloride: 106 mmol/L (ref 96–106)
GFR, EST AFRICAN AMERICAN: 139 mL/min/{1.73_m2} (ref >59)
GFR, EST NON AFRICAN AMERICAN: 121 mL/min/{1.73_m2} (ref >59)
GLOBULIN, TOTAL: 2.7 g/dL (ref 1.5–4.5)
Glucose: 79 mg/dL (ref 65–99)
POTASSIUM: 4.1 mmol/L (ref 3.5–5.2)
SODIUM: 138 mmol/L (ref 134–144)
TOTAL PROTEIN: 6.3 g/dL (ref 6.0–8.5)

## 2017-11-14 LAB — PROTEIN / CREATININE RATIO, URINE
Creatinine, Urine: 33.9 mg/dL
PROTEIN/CREAT RATIO: 192 mg/g{creat} (ref 0–200)
Protein, Ur: 6.5 mg/dL

## 2017-11-15 ENCOUNTER — Ambulatory Visit (HOSPITAL_COMMUNITY)
Admission: RE | Admit: 2017-11-15 | Discharge: 2017-11-15 | Disposition: A | Discharge status: Home / Self Care | Payer: Medicaid Other | Source: Ambulatory Visit | Attending: Obstetrics & Gynecology | Admitting: Obstetrics & Gynecology

## 2017-11-15 ENCOUNTER — Encounter (HOSPITAL_COMMUNITY): Payer: Self-pay

## 2017-11-15 DIAGNOSIS — Z3689 Encounter for other specified antenatal screening: Secondary | ICD-10-CM | POA: Diagnosis present

## 2017-11-15 DIAGNOSIS — O99212 Obesity complicating pregnancy, second trimester: Secondary | ICD-10-CM | POA: Diagnosis not present

## 2017-11-15 DIAGNOSIS — O34219 Maternal care for unspecified type scar from previous cesarean delivery: Secondary | ICD-10-CM | POA: Insufficient documentation

## 2017-11-15 DIAGNOSIS — E669 Obesity, unspecified: Secondary | ICD-10-CM | POA: Insufficient documentation

## 2017-11-15 DIAGNOSIS — O0990 Supervision of high risk pregnancy, unspecified, unspecified trimester: Secondary | ICD-10-CM

## 2017-11-15 DIAGNOSIS — O09522 Supervision of elderly multigravida, second trimester: Secondary | ICD-10-CM | POA: Diagnosis not present

## 2017-11-15 DIAGNOSIS — Z6836 Body mass index (BMI) 36.0-36.9, adult: Secondary | ICD-10-CM | POA: Diagnosis not present

## 2017-11-15 DIAGNOSIS — Z3A19 19 weeks gestation of pregnancy: Secondary | ICD-10-CM | POA: Insufficient documentation

## 2017-11-15 DIAGNOSIS — O24112 Pre-existing diabetes mellitus, type 2, in pregnancy, second trimester: Secondary | ICD-10-CM | POA: Insufficient documentation

## 2017-11-15 DIAGNOSIS — O0992 Supervision of high risk pregnancy, unspecified, second trimester: Secondary | ICD-10-CM | POA: Insufficient documentation

## 2017-11-15 DIAGNOSIS — Z7984 Long term (current) use of oral hypoglycemic drugs: Secondary | ICD-10-CM | POA: Insufficient documentation

## 2017-11-15 IMAGING — US US MFM OB DETAIL+14 WK
1 series · 14 of 28 positions shown · non-contrast
Comparison: none

[Series 1: us mfm ob detail+14 wk · 123 acquisitions, 14 frames shown]
[im 5/123]
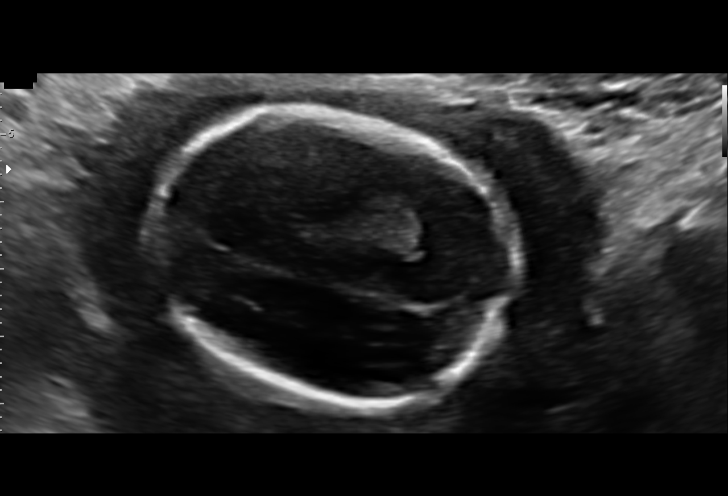
[im 14/123]
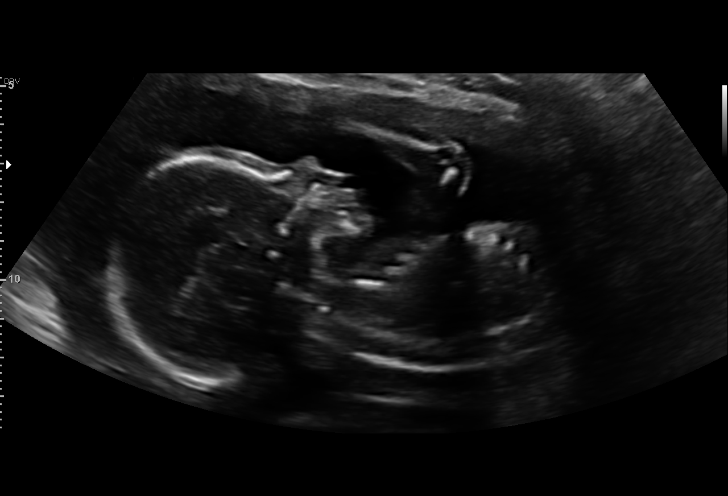
[im 23/123]
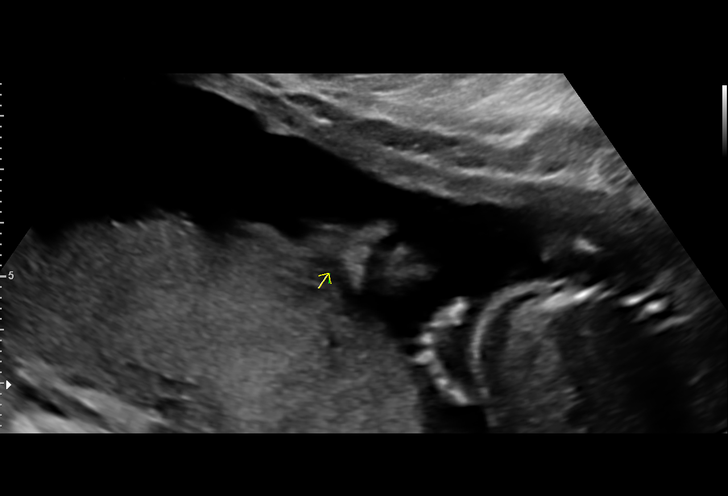
[im 32/123]
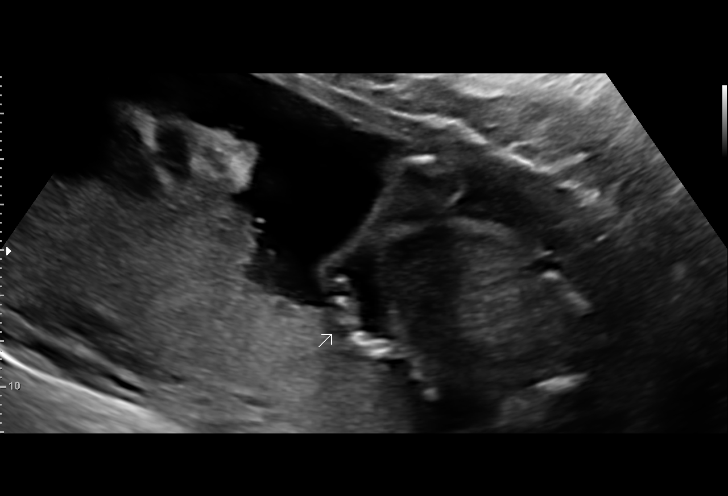
[im 41/123]
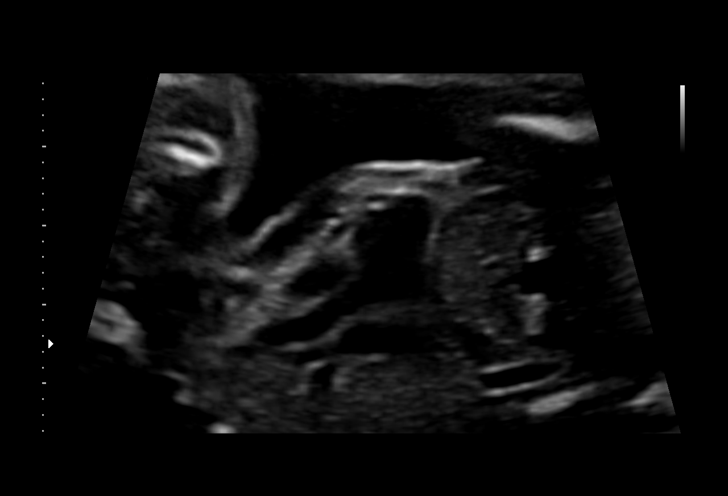
[im 50/123]
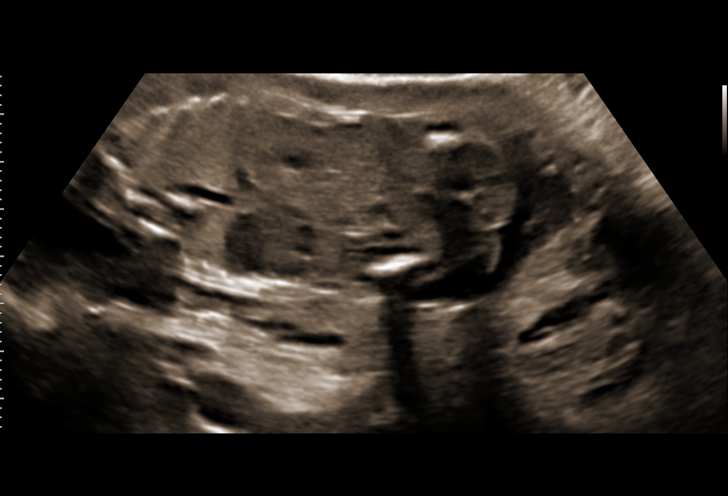
[im 59/123]
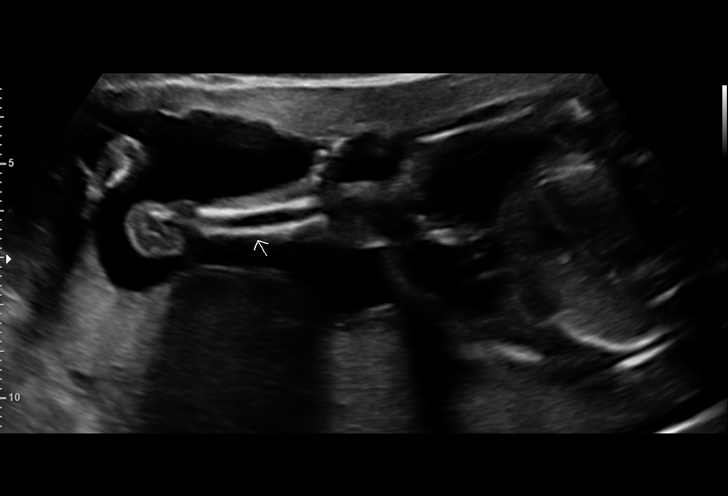
[im 68/123]
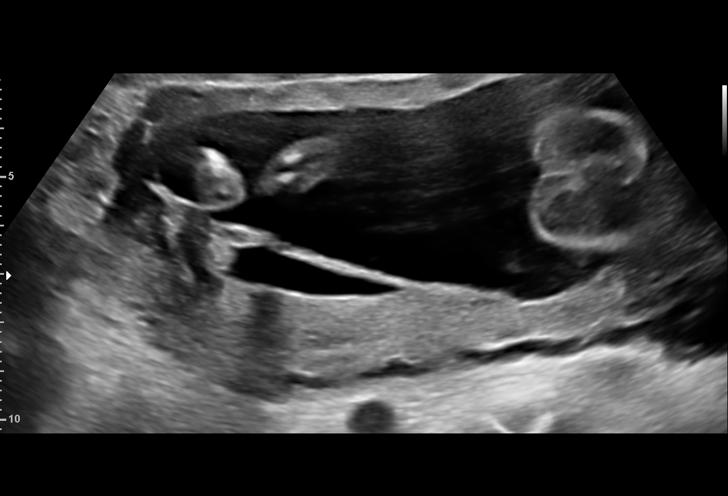
[im 77/123]
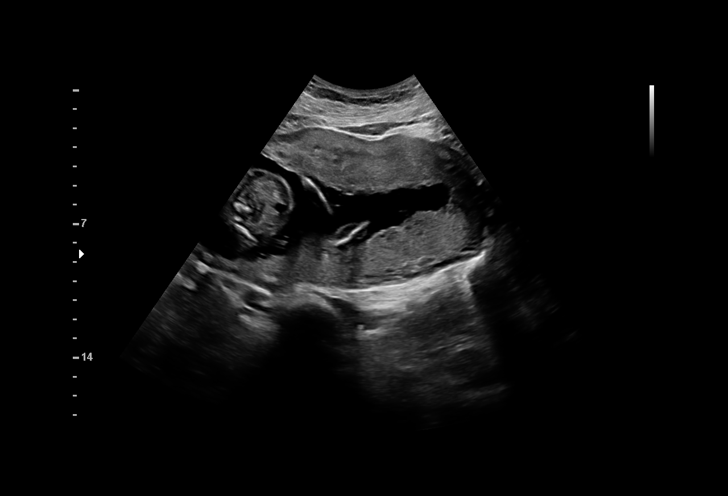
[im 86/123]
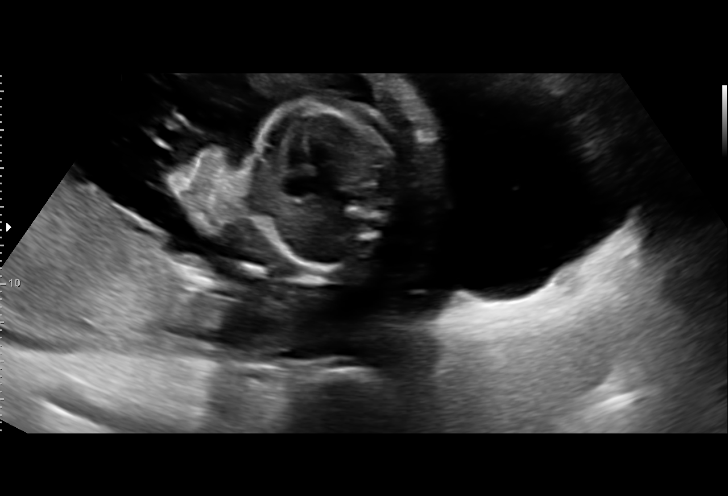
[im 95/123]
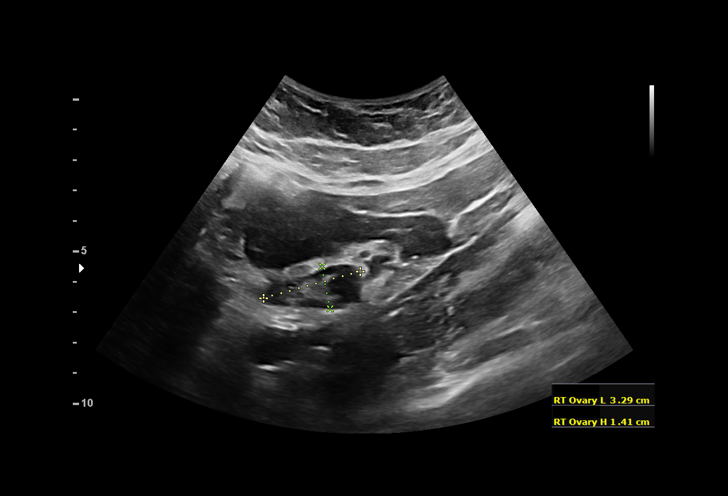
[im 104/123]
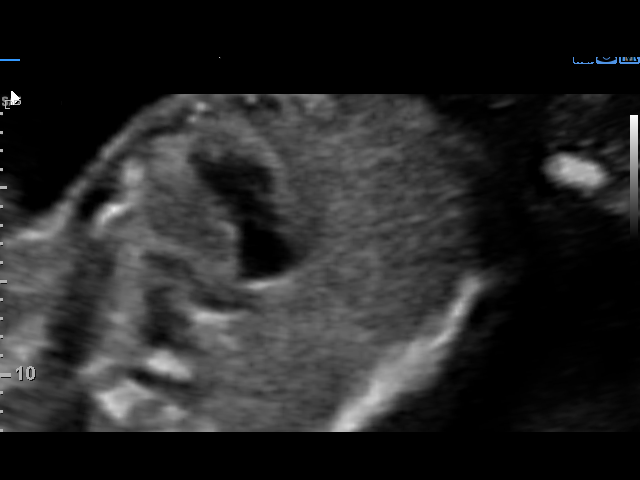
[im 113/123]
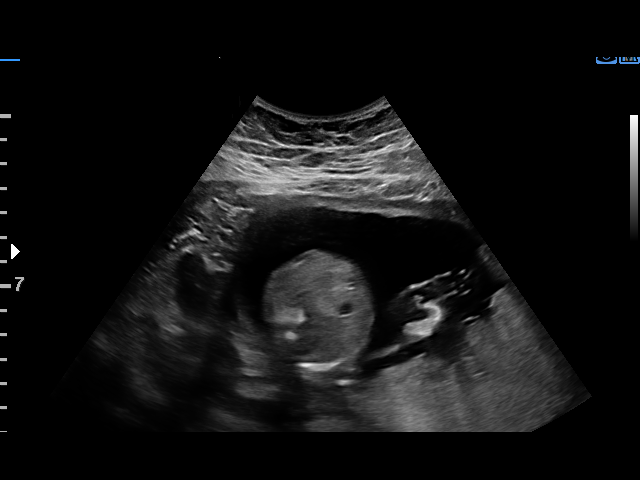
[im 123/123]
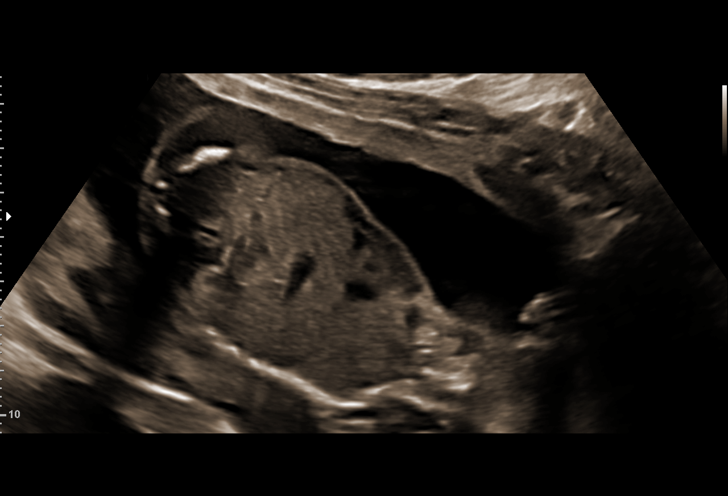

[14 of 28 positions shown; findings below may reference images not displayed]

1  EPISAKI             [PHONE_NUMBER]      [PHONE_NUMBER]     [PHONE_NUMBER]
Indications

19 weeks gestation of pregnancy
History of cesarean delivery, currently        [ZI]
pregnant
Obesity complicating pregnancy, second         [ZI]
trimester (pre preg BMI 35.1)
Pre-existing diabetes, type 2, in pregnancy,   [ZI]
second trimester; glyburide
Advanced maternal age multigravida (41),       [ZI]
second trimester; low risk quad
Encounter for fetal anatomic survey            [ZI]
OB History

Blood Type:            Height:  5'2"   Weight (lb):  202       BMI:
Gravidity:    6         Term:   4
Ectopic:      1         Living: 4
Fetal Evaluation

Num Of Fetuses:     1
Fetal Heart         146
Rate(bpm):
Cardiac Activity:   Observed
Presentation:       Variable
Placenta:           Posterior, above cervical os
P. Cord Insertion:  Visualized

Amniotic Fluid
AFI FV:      Subjectively within normal limits

Largest Pocket(cm)
4.43
Biometry

BPD:      42.3  mm     G. Age:  18w 6d         12  %    CI:         64.74  %    70 - 86
FL/HC:       17.9  %    16.8 -
HC:        169  mm     G. Age:  19w 4d         28  %    HC/AC:       1.13       1.09 -
AC:      149.2  mm     G. Age:  20w 1d         55  %    FL/BPD:      71.4  %
FL:       30.2  mm     G. Age:  19w 3d         24  %    FL/AC:       20.2  %    20 - 24
HUM:      30.9  mm     G. Age:  20w 2d         63  %
CER:      18.8  mm     G. Age:  18w 3d          7  %
NFT:       5.1  mm

CM:          5  mm

Est. FW:     309   gm    0 lb 11 oz     46  %
Gestational Age

LMP:           19w 6d        Date:  [DATE]                 EDD:    [DATE]
U/S Today:     19w 4d                                        EDD:    [DATE]
Best:          19w 6d     Det. By:  LMP  ([DATE])          EDD:    [DATE]
Anatomy

Cranium:               Appears normal         Aortic Arch:            Appears normal
Cavum:                 Appears normal         Ductal Arch:            Appears normal
Ventricles:            Appears normal         Diaphragm:              Appears normal
Choroid Plexus:        Appears normal         Stomach:                Appears normal, left
sided
Cerebellum:            Appears normal         Abdomen:                Appears normal
Posterior Fossa:       Appears normal         Abdominal Wall:         Appears nml (cord
insert, abd wall)
Nuchal Fold:           Appears normal         Cord Vessels:           Appears normal (3
vessel cord)
Face:                  Appears normal         Kidneys:                Appear normal
(orbits and profile)
Lips:                  Appears normal         Bladder:                Appears normal
Thoracic:              Appears normal         Spine:                  Not well visualized
Heart:                 Appears normal         Upper Extremities:      Appears normal
(4CH, axis, and situs
RVOT:                  Appears normal         Lower Extremities:      Appears normal
LVOT:                  Appears normal

Other:  Fetus appears to be a female. Heels and RT 5th digit visualized.
Nasal bone visualized. Technically difficult due to maternal habitus
and fetal position.
Cervix Uterus Adnexa

Cervix
Length:           5.42  cm.
Normal appearance by transabdominal scan.

Uterus
No abnormality visualized.

Left Ovary
Size(cm)       3.5  x   1.6    x  1.6       Vol(ml):
Within normal limits.
Right Ovary
Size(cm)       3.3  x   1.4    x  1.6       Vol(ml):
Within normal limits.
Impression

SIUP at 19+6 weeks
Normal detailed fetal anatomy; limited views of spine
Markers of aneuploidy: none
Normal amniotic fluid volume
Measurements consistent with LMP dating
Recommendations

Follow-up ultrasound in 4-6 weeks to complete anatomy
survey and assess growth

## 2017-11-22 ENCOUNTER — Other Ambulatory Visit (HOSPITAL_COMMUNITY): Payer: Self-pay | Admitting: *Deleted

## 2017-11-22 DIAGNOSIS — O09529 Supervision of elderly multigravida, unspecified trimester: Secondary | ICD-10-CM

## 2017-11-27 ENCOUNTER — Ambulatory Visit (INDEPENDENT_AMBULATORY_CARE_PROVIDER_SITE_OTHER): Payer: Self-pay | Admitting: Obstetrics and Gynecology

## 2017-11-27 VITALS — BP 98/49 | HR 82 | Wt 205.4 lb

## 2017-11-27 DIAGNOSIS — O09529 Supervision of elderly multigravida, unspecified trimester: Secondary | ICD-10-CM

## 2017-11-27 DIAGNOSIS — O24919 Unspecified diabetes mellitus in pregnancy, unspecified trimester: Secondary | ICD-10-CM

## 2017-11-27 DIAGNOSIS — O0992 Supervision of high risk pregnancy, unspecified, second trimester: Secondary | ICD-10-CM

## 2017-11-27 DIAGNOSIS — O09522 Supervision of elderly multigravida, second trimester: Secondary | ICD-10-CM

## 2017-11-27 DIAGNOSIS — Z3009 Encounter for other general counseling and advice on contraception: Secondary | ICD-10-CM | POA: Insufficient documentation

## 2017-11-27 DIAGNOSIS — O34219 Maternal care for unspecified type scar from previous cesarean delivery: Secondary | ICD-10-CM

## 2017-11-27 DIAGNOSIS — O0990 Supervision of high risk pregnancy, unspecified, unspecified trimester: Secondary | ICD-10-CM

## 2017-11-27 DIAGNOSIS — O24912 Unspecified diabetes mellitus in pregnancy, second trimester: Secondary | ICD-10-CM

## 2017-11-27 LAB — POCT URINALYSIS DIP (DEVICE)
BILIRUBIN URINE: NEGATIVE
Glucose, UA: NEGATIVE mg/dL
HGB URINE DIPSTICK: NEGATIVE
Ketones, ur: NEGATIVE mg/dL
LEUKOCYTES UA: NEGATIVE
NITRITE: NEGATIVE
PH: 7 (ref 5.0–8.0)
Protein, ur: NEGATIVE mg/dL
SPECIFIC GRAVITY, URINE: 1.015 (ref 1.005–1.030)
Urobilinogen, UA: 0.2 mg/dL (ref 0.0–1.0)

## 2017-11-27 NOTE — Progress Notes (Signed)
C/o numbness on left thigh when she stands a lot.

## 2017-11-27 NOTE — Progress Notes (Signed)
   PRENATAL VISIT NOTE  Subjective:  Carolyn Mendez is a 41 y.o. [id number] at 9713w4d being seen today for ongoing prenatal care.  She is currently monitored for the following issues for this high-risk pregnancy and has OBESITY; Depression; Irritable bowel disease; Supervision of high risk pregnancy, antepartum; Diabetes mellitus complicating pregnancy, antepartum; Previous cesarean delivery, antepartum; AMA (advanced maternal age) multigravida 35+; and Unwanted fertility on their problem list.  Patient reports some pulling when baby kicks.  Contractions: Not present. Vag. Bleeding: None.  Movement: Present. Denies leaking of fluid.   Glyburide 2.5 QHS FG: 73-92 PP: 81-121  The following portions of the patient's history were reviewed and updated as appropriate: allergies, current medications, past family history, past medical history, past social history, past surgical history and problem list. Problem list updated.  Objective:   Vitals:   11/27/17 1622  BP: (!) 98/49  Pulse: 82  Weight: 205 lb 6.4 oz (93.2 kg)    Fetal Status: Fetal Heart Rate (bpm): 143   Movement: Present     General:  Alert, oriented and cooperative. Patient is in no acute distress.  Skin: Skin is warm and dry. No rash noted.   Cardiovascular: Normal heart rate noted  Respiratory: Normal respiratory effort, no problems with respiration noted  Abdomen: Soft, gravid, appropriate for gestational age.  Pain/Pressure: Present     Pelvic: Cervical exam deferred        Extremities: Normal range of motion.  Edema: None  Mental Status:  Normal mood and affect. Normal behavior. Normal judgment and thought content.   Assessment and Plan:  Pregnancy: [id number] at 4813w4d  1. Supervision of high risk pregnancy, antepartum Normal anatomy, incomplete, for f/u Counseled regarding BTL and contraception.  2. Diabetes mellitus complicating pregnancy, antepartum Glyburide 2.5 mg daily Excellent control, cont this dose Cont  BASA Fetal echo 12/14  3. Antepartum multigravida of advanced maternal age Needs testing 36 weeks  4. Previous cesarean delivery, antepartum Reviewed risks/benefits of TOLAC versus RCS in detail. Patient counseled regarding potential vaginal delivery, chance of success, future implications, possible uterine rupture and need for urgent/emergent repeat cesarean. Counseled regarding scheduled repeat cesarean including risks of bleeding, infection, damage to surrounding tissue, abnormal placentation, implications for future pregnancies. All questions answered.  Patient desires TOLAC, consent signed 11/27/2017.   Preterm labor symptoms and general obstetric precautions including but not limited to vaginal bleeding, contractions, leaking of fluid and fetal movement were reviewed in detail with the patient. Please refer to After Visit Summary for other counseling recommendations.  Return in about 2 weeks (around 12/11/2017) for OB visit.   Conan BowensKelly M Samai Corea, MD

## 2017-12-13 ENCOUNTER — Encounter: Payer: Self-pay | Admitting: Advanced Practice Midwife

## 2017-12-13 ENCOUNTER — Ambulatory Visit (INDEPENDENT_AMBULATORY_CARE_PROVIDER_SITE_OTHER): Payer: Self-pay | Admitting: Advanced Practice Midwife

## 2017-12-13 VITALS — BP 96/52 | HR 76 | Wt 206.6 lb

## 2017-12-13 DIAGNOSIS — O24912 Unspecified diabetes mellitus in pregnancy, second trimester: Secondary | ICD-10-CM

## 2017-12-13 DIAGNOSIS — O24919 Unspecified diabetes mellitus in pregnancy, unspecified trimester: Secondary | ICD-10-CM

## 2017-12-13 DIAGNOSIS — O09522 Supervision of elderly multigravida, second trimester: Secondary | ICD-10-CM

## 2017-12-13 DIAGNOSIS — O09529 Supervision of elderly multigravida, unspecified trimester: Secondary | ICD-10-CM

## 2017-12-13 NOTE — Progress Notes (Signed)
[employer] interpreter Koleen NimrodAdrian [phone]

## 2017-12-13 NOTE — Progress Notes (Signed)
   PRENATAL VISIT NOTE  Subjective:  Carolyn Mendez is a 41 y.o. [id number] at 5551w6d being seen today for ongoing prenatal care.  She is currently monitored for the following issues for this high-risk pregnancy and has OBESITY; Depression; Irritable bowel disease; Supervision of high risk pregnancy, antepartum; Diabetes mellitus complicating pregnancy, antepartum; Previous cesarean delivery, antepartum; AMA (advanced maternal age) multigravida 35+; and Unwanted fertility on their problem list.  Patient reports no complaints.  Contractions: Not present. Vag. Bleeding: None.  Movement: Present. Denies leaking of fluid.   Doing well on diet with Glyburide  FBS:  89-90-95-94-85 PB:  119-116-115-92-120 PL:  110-112-118-118-118 PD:  117-119-101-118  The following portions of the patient's history were reviewed and updated as appropriate: allergies, current medications, past family history, past medical history, past social history, past surgical history and problem list. Problem list updated.  Objective:   Vitals:   12/13/17 1550  BP: (!) 96/52  Pulse: 76  Weight: 206 lb 9.6 oz (93.7 kg)    Fetal Status: Fetal Heart Rate (bpm): 140   Movement: Present     General:  Alert, oriented and cooperative. Patient is in no acute distress.  Skin: Skin is warm and dry. No rash noted.   Cardiovascular: Normal heart rate noted  Respiratory: Normal respiratory effort, no problems with respiration noted  Abdomen: Soft, gravid, appropriate for gestational age.  Pain/Pressure: Present     Pelvic: Cervical exam deferred        Extremities: Normal range of motion.  Edema: Trace  Mental Status:  Normal mood and affect. Normal behavior. Normal judgment and thought content.   Assessment and Plan:  Pregnancy: [id number] at 6951w6d  There are no diagnoses linked to this encounter. Preterm labor symptoms and general obstetric precautions including but not limited to vaginal bleeding, contractions, leaking of  fluid and fetal movement were reviewed in detail with the patient. Please refer to After Visit Summary for other counseling recommendations.  RTO 4 weeks Has US scheduled first week in January   Wynelle BourgeoisMarie Williams, [place]

## 2017-12-13 NOTE — Patient Instructions (Signed)
Segundo trimestre de embarazo  Second Trimester of Pregnancy  El segundo trimestre va desde la semana 14 hasta la 27, desde el cuarto hasta el sexto mes, y suele ser el momento en el que mejor se siente. Su organismo se ha adaptado a estar embarazada, y comienza a sentirse físicamente mejor. En general, las náuseas matutinas han disminuido o han desaparecido completamente, puede tener más energía y un aumento de apetito. El segundo trimestre es también la época en la que el feto se desarrolla rápidamente. Hacia el final del sexto mes, el feto mide aproximadamente 9 pulgadas (23 cm) y pesa alrededor de 1½ libras (700 g). Es probable que sienta que el bebé se mueve (da pataditas) entre las 16 y 20 semanas del embarazo.  Cambios en el cuerpo durante el segundo trimestre  Su cuerpo continua experimentando numerosos cambios durante su segundo trimestre. Estos cambios varían de una mujer a otra.  · Seguirá aumentando de peso. Notará que la parte baja del abdomen sobresale.  · Podrán aparecer las primeras estrías en las caderas, el abdomen y las mamas.  · Es posible que tenga dolores de cabeza que pueden aliviarse con ciertos medicamentos. Los medicamentos que tome deben estar aprobados por el médico.  · Tal vez tenga necesidad de orinar con más frecuencia porque el feto está ejerciendo presión sobre la vejiga.  · Debido al embarazo podrá sentir acidez estomacal con frecuencia.  · Puede estar estreñida, ya que ciertas hormonas enlentecen los movimientos de los músculos que empujan los desechos a través de los intestinos.  · Pueden aparecer hemorroides o abultarse e hincharse las venas (venas varicosas).  · Puede sentir dolor en la espalda. Esto se debe a:  ? Aumento de peso.  ? Las hormonas del embarazo relajan las articulaciones en la pelvis.  ? Un cambio en el peso y los músculos que ayudan a mantener su equilibrio.  · Sus pechos seguirán creciendo y se pondrán cada vez más sensibles.   · Las encías pueden sangrar y estar sensibles al cepillado y al hilo dental.  · Pueden aparecer zonas oscuras o manchas (cloasma, máscara del embarazo) en el rostro. Esto probablemente se atenuará después del nacimiento del bebé.  · Es posible que se forme una línea oscura desde el ombligo hasta la zona del pubis (linea nigra). Esto probablemente se atenuará después del nacimiento del bebé.  · Tal vez haya cambios en el cabello. Esto cambios pueden incluir su engrosamiento, crecimiento rápido y cambios en la textura. Además, a algunas mujeres se les cae el cabello durante o después del embarazo, o tienen el cabello seco o fino. Lo más probable es que el cabello se le normalice después del nacimiento del bebé.    Qué debe esperar en las visitas prenatales  Durante una visita prenatal de rutina:  · La pesarán para asegurarse de que usted y el feto están creciendo normalmente.  · Le tomarán la presión arterial.  · Le medirán el abdomen para controlar el desarrollo del bebé.  · Se escucharán los latidos cardíacos fetales.  · Se evaluarán los resultados de los estudios solicitados en visitas anteriores.    El médico puede preguntarle lo siguiente:  · Cómo se siente.  · Si siente los movimientos del bebé.  · Si ha tenido síntomas anormales, como pérdida de líquido, sangrado, dolores de cabeza intensos o cólicos abdominales.  · Si está consumiendo algún producto que contenga tabaco, como cigarrillos, tabaco de mascar y cigarrillos electrónicos.  · Si tiene alguna pregunta.    Otros   estudios que podrán realizarse durante el segundo trimestre incluyen lo siguiente:  · Análisis de sangre para detectar lo siguiente:  ? Concentraciones de hierro bajas (anemia).  ? Nivel alto de azúcar en la sangre que afecta a las mujeres embarazadas (diabetes gestacional) entre las semanas 24 y 28.  ? Anticuerpos Rh. Esto es para detectar una proteína en los glóbulos rojos (factor Rh).   · Análisis de orina para detectar infecciones, diabetes o proteínas en la orina.  · Una ecografía para confirmar que el bebé crece y se desarrolla correctamente.  · Una amniocentesis para diagnosticar posibles problemas genéticos.  · Estudios del feto para descartar espina bífida y síndrome de Down.  · Prueba del VIH (virus de inmunodeficiencia humana). Los exámenes prenatales de rutina incluyen la prueba de detección del VIH, a menos que decida no realizársela.    Siga estas indicaciones en su casa:  Medicamentos  · Siga las indicaciones del médico en relación con el uso de medicamentos. Durante el embarazo, hay medicamentos que pueden tomarse y otros que no.  · Tome vitaminas prenatales que contengan por lo menos 600 microgramos (?g) de ácido fólico.  · Si está estreñida, tome un laxante suave, si el médico lo autoriza.  Qué debe comer y beber  · Lleve una dieta equilibrada que incluya gran cantidad de frutas y verduras frescas, cereales integrales, buenas fuentes de proteínas como carnes magras, huevos o tofu, y lácteos descremados. El médico la ayudará a determinar la cantidad de peso que puede aumentar.  · No coma carne cruda ni quesos sin cocinar. Estos elementos contienen gérmenes que pueden causar defectos congénitos en el bebé.  · Si no consume muchos alimentos con calcio, hable con su médico sobre si debería tomar un suplemento diario de calcio.  · Limite el consumo de alimentos con alto contenido de grasas y azúcares procesados, como alimentos fritos o dulces.  · Para evitar el estreñimiento:  ? Bebe suficiente líquido para mantener la orina clara o de color amarillo pálido.  ? Consuma alimentos ricos en fibra, como frutas y verduras frescas, cereales integrales y frijoles.  Actividad  · Haga ejercicio solamente como se lo haya indicado el médico. La mayoría de las mujeres pueden continuar su rutina de ejercicios durante el embarazo. Intente realizar como mínimo 30 minutos de actividad física por  lo menos 5 días a la semana. Deje de hacer ejercicio si experimenta contracciones uterinas.  · No levante objetos pesados, use zapatos de tacones bajos y mantenga una buena postura.  · Puede seguir manteniendo relaciones sexuales, a menos que el médico le indique lo contrario.  Alivio del dolor y del malestar  · Use un sostén que le brinde buen soporte para prevenir las molestias causadas por la sensibilidad en los pechos.  · Dese baños de asiento con agua tibia para aliviar el dolor o las molestias causadas por las hemorroides. Use una crema para las hemorroides si el médico la autoriza.  · Descanse con las piernas elevadas si tiene calambres o dolor de cintura.  · Si tiene venas varicosas, use medias de descanso. Eleve los pies durante 15 minutos, 3 o 4 veces por día. Limite el consumo de sal en su dieta.  Cuidados prenatales  · Escriba sus preguntas. Llévelas cuando concurra a las visitas prenatales.  · Concurra a todas las visitas prenatales tal como se lo haya indicado el médico. Esto es importante.  Seguridad  · Use el cinturón de seguridad en todo momento mientras conduce.  · Haga una lista de los   números de teléfono de emergencia, que incluya los números de teléfono de familiares, amigos, el hospital y los departamentos de policía y bomberos.  Instrucciones generales  · Pídale al médico que la derive a clases de educación prenatal en su localidad. Debe comenzar a tomar las clases antes de que empiece el mes 6 de embarazo.  · Pida ayuda si tiene necesidades nutricionales o de asesoramiento durante el embarazo. El médico puede aconsejarla o derivarla a especialistas para que la ayuden con diferentes necesidades.  · No se dé baños de inmersión en agua caliente, baños turcos ni saunas.  · No se haga duchas vaginales ni use tampones o toallas higiénicas perfumadas.  · No mantenga las piernas cruzadas durante mucho tiempo.  · Evite el contacto con las bandejas sanitarias de los gatos y la tierra  que estos animales usan. Estos elementos contienen bacterias que pueden causar defectos congénitos al bebé y la posible pérdida del feto debido a un aborto espontáneo o muerte fetal.  · Evite fumar, consumir hierbas, beber alcohol y tomar fármacos que no le hayan recetado. Las sustancias químicas que estos productos contienen pueden afectar la formación y el desarrollo del bebé.  · No consuma ningún producto que contenga nicotina o tabaco, como cigarrillos y cigarrillos electrónicos. Si necesita ayuda para dejar de fumar, consulte al médico.  · Visite a su dentista si aún no lo ha hecho durante el embarazo. Use un cepillo de dientes blando para higienizarse los dientes y pásese el hilo dental con suavidad.  Comuníquese con un médico si:  · Tiene mareos.  · Siente cólicos leves, presión en la pelvis o dolor persistente en el abdomen.  · Tiene náuseas, vómitos o diarrea persistentes.  · Observa una secreción vaginal con mal olor.  · Siente dolor al orinar.  Solicite ayuda de inmediato si:  · Tiene fiebre.  · Tiene una pérdida de líquido por la vagina.  · Tiene sangrado o pequeñas pérdidas vaginales.  · Siente dolor intenso o cólicos en el abdomen.  · Sube de peso o baja de peso rápidamente.  · Tiene dificultad para respirar y siente dolor de pecho.  · Súbitamente se le hinchan mucho el rostro, las manos, los tobillos, los pies o las piernas.  · No ha sentido los movimientos del bebé durante una hora.  · Siente un dolor de cabeza intenso que no se alivia al tomar medicamentos.  · Nota cambios en la visión.  Resumen  · El segundo trimestre va desde la semana 14 hasta la 27, desde el cuarto hasta el sexto mes. Es también una época en la que el feto se desarrolla rápidamente.  · Su organismo atraviesa por muchos cambios durante el embarazo. Estos cambios varían de una mujer a otra.  · Evite fumar, consumir hierbas, beber alcohol y tomar fármacos que no le hayan recetado. Estas sustancias químicas afectan la formación y el  desarrollo de su bebé.  · No consuma ningún producto que contenga tabaco, lo que incluye cigarrillos, tabaco de mascar y cigarrillos electrónicos. Si necesita ayuda para dejar de fumar, consulte al médico.  · Comuníquese con su médico si tiene preguntas sobre esto. Concurra a todas las visitas prenatales tal como se lo haya indicado el médico. Esto es importante.  Esta información no tiene como fin reemplazar el consejo del médico. Asegúrese de hacerle al médico cualquier pregunta que tenga.  Document Released: 09/21/2005 Document Revised: 04/24/2017 Document Reviewed: 04/24/2017  Elsevier Interactive Patient Education © 2018 [employer]

## 2017-12-27 ENCOUNTER — Ambulatory Visit (HOSPITAL_COMMUNITY)
Admission: RE | Admit: 2017-12-27 | Discharge: 2017-12-27 | Disposition: A | Discharge status: Home / Self Care | Payer: Self-pay | Source: Ambulatory Visit | Attending: Obstetrics & Gynecology | Admitting: Obstetrics & Gynecology

## 2017-12-27 ENCOUNTER — Encounter (HOSPITAL_COMMUNITY): Payer: Self-pay

## 2017-12-27 ENCOUNTER — Other Ambulatory Visit (HOSPITAL_COMMUNITY): Payer: Self-pay | Admitting: Maternal and Fetal Medicine

## 2017-12-27 DIAGNOSIS — O99212 Obesity complicating pregnancy, second trimester: Secondary | ICD-10-CM

## 2017-12-27 DIAGNOSIS — O09522 Supervision of elderly multigravida, second trimester: Secondary | ICD-10-CM | POA: Insufficient documentation

## 2017-12-27 DIAGNOSIS — O24112 Pre-existing diabetes mellitus, type 2, in pregnancy, second trimester: Secondary | ICD-10-CM

## 2017-12-27 DIAGNOSIS — Z3009 Encounter for other general counseling and advice on contraception: Secondary | ICD-10-CM

## 2017-12-27 DIAGNOSIS — O34219 Maternal care for unspecified type scar from previous cesarean delivery: Secondary | ICD-10-CM

## 2017-12-27 DIAGNOSIS — O09529 Supervision of elderly multigravida, unspecified trimester: Secondary | ICD-10-CM

## 2017-12-27 DIAGNOSIS — Z3A25 25 weeks gestation of pregnancy: Secondary | ICD-10-CM

## 2017-12-27 DIAGNOSIS — Z362 Encounter for other antenatal screening follow-up: Secondary | ICD-10-CM

## 2017-12-27 IMAGING — US US MFM OB FOLLOW-UP
1 series · 13 of 28 positions shown · non-contrast
Comparison: none

[Series 1: us mfm ob follow-up · 13 of 51 slices shown]
[im 2/51]
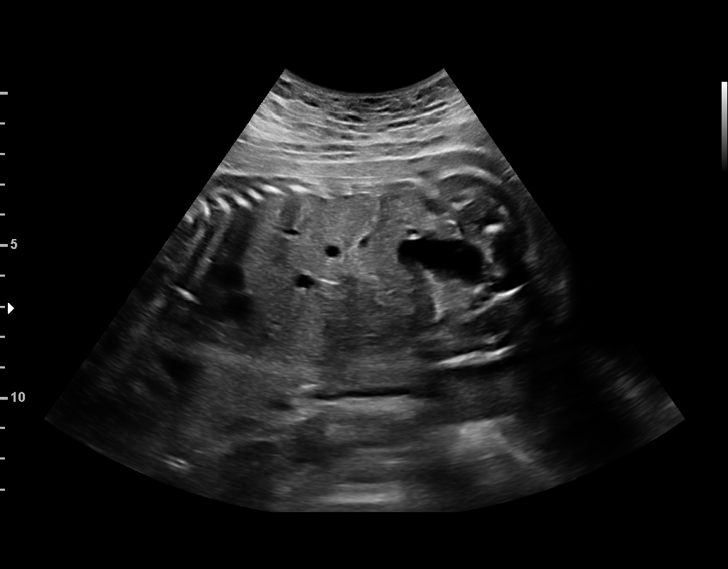
[im 6/51]
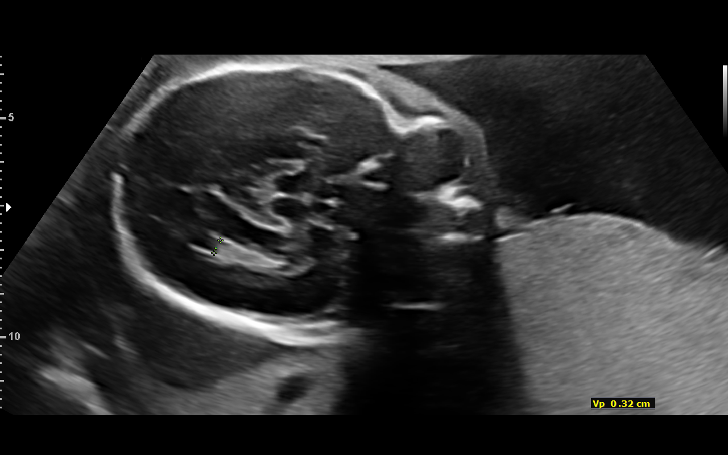
[im 10/51]
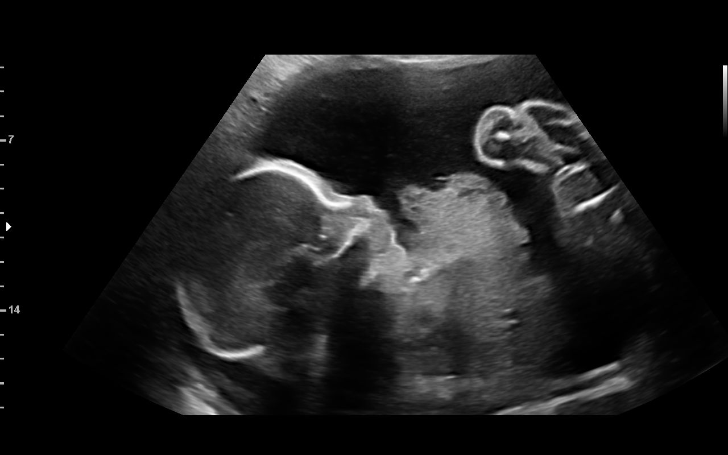
[im 13/51]
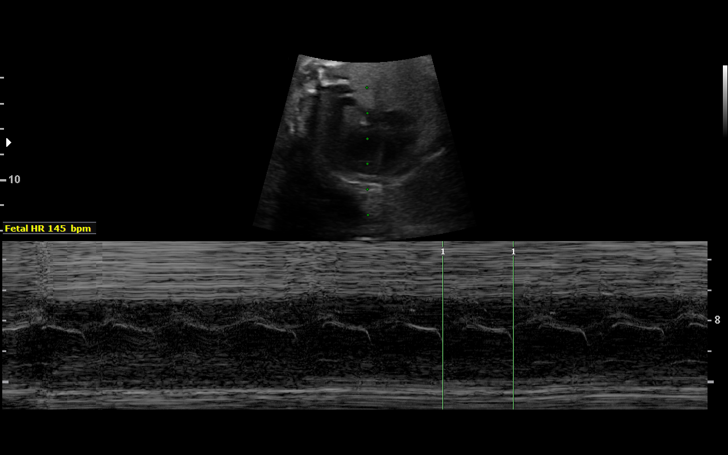
[im 17/51]
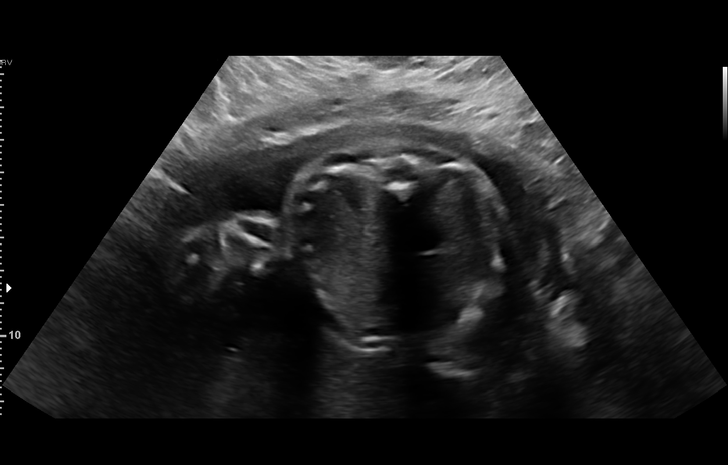
[im 21/51]
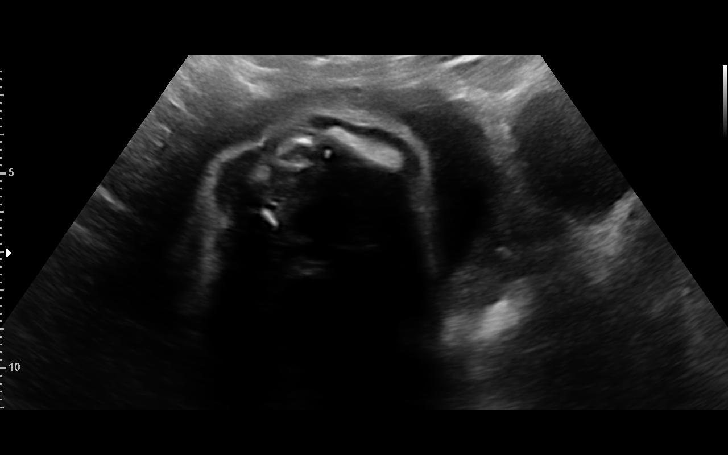
[im 26/51]
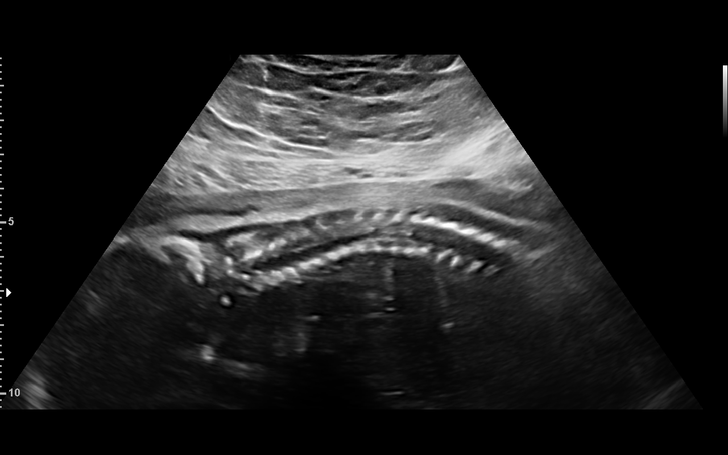
[im 30/51]
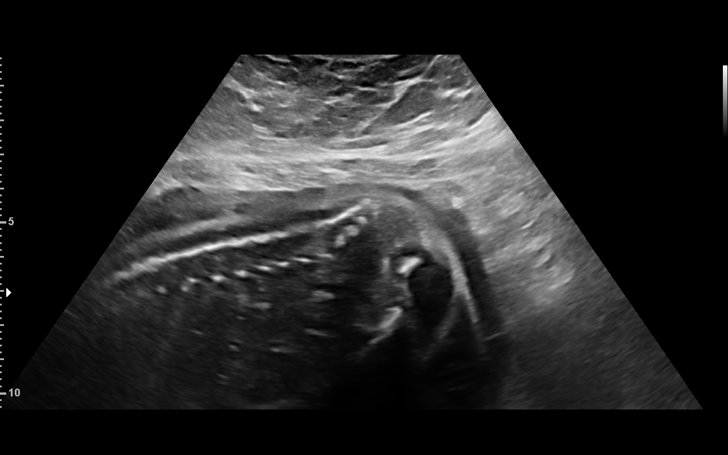
[im 34/51]
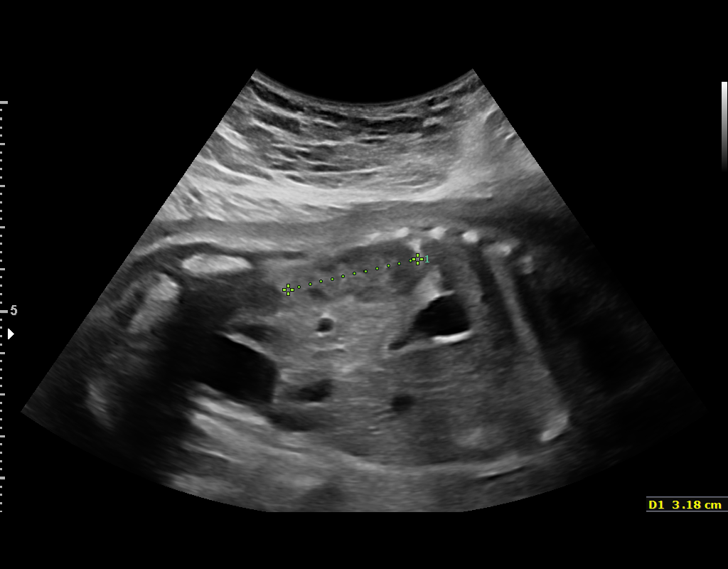
[im 38/51]
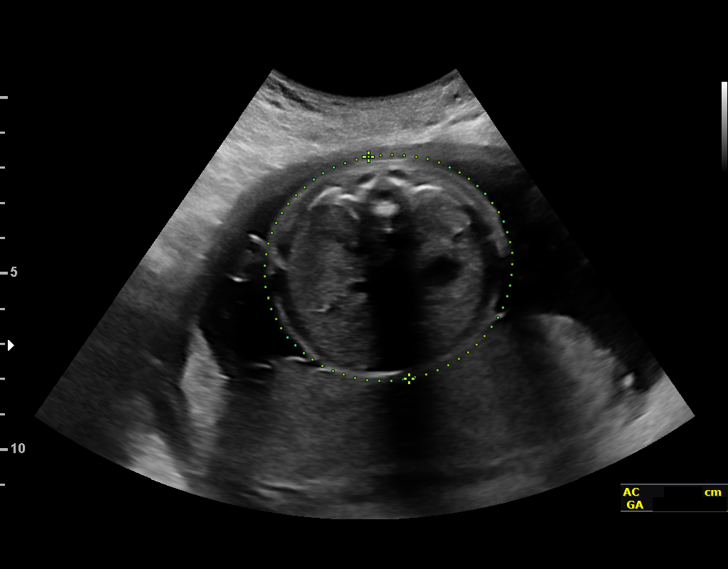
[im 41/51]
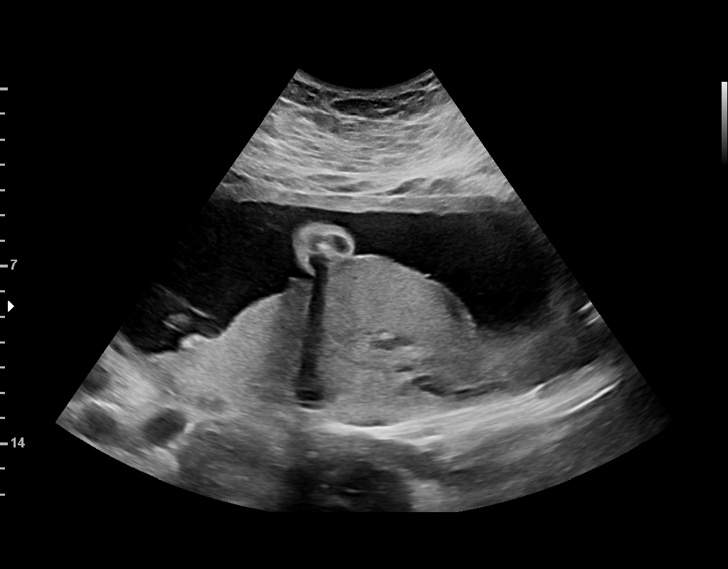
[im 45/51]
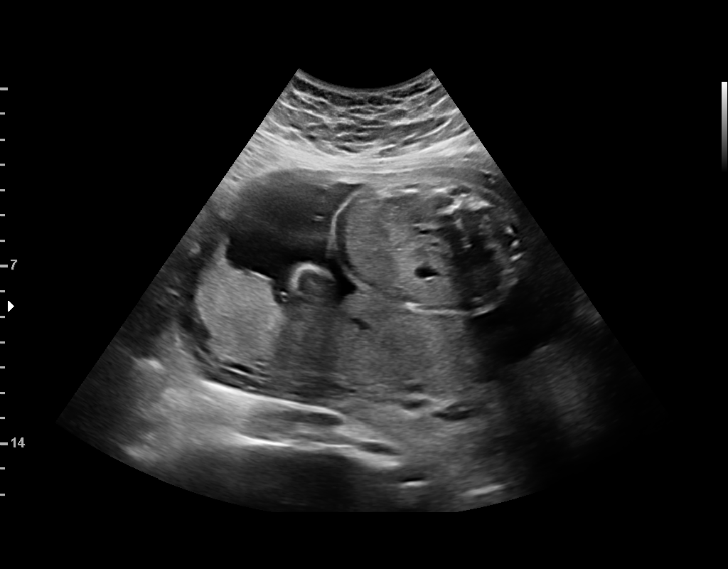
[im 49/51]
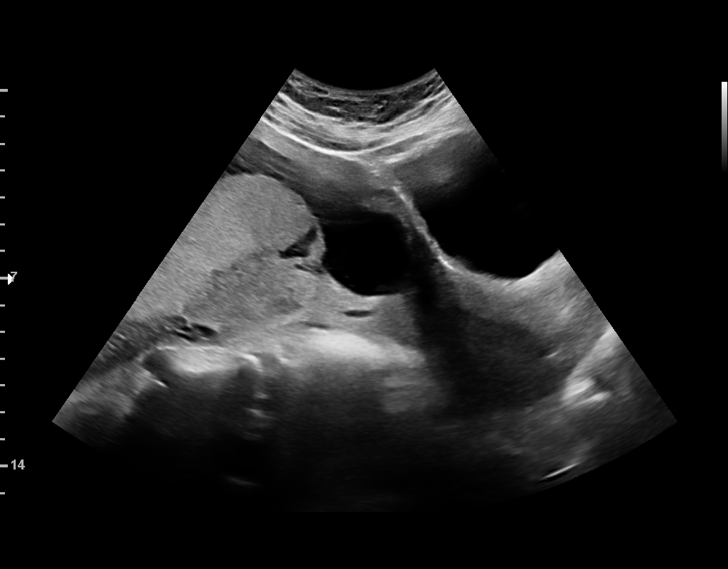

[13 of 28 positions shown; findings below may reference images not displayed]

1  DD            [PHONE_NUMBER]      [PHONE_NUMBER]     [PHONE_NUMBER]
Indications

25 weeks gestation of pregnancy
History of cesarean delivery, currently        [2V]
pregnant
Obesity complicating pregnancy, second         [2V]
trimester (pre preg BMI 35.1)
Pre-existing diabetes, type 2, in pregnancy,   [2V]
second trimester; glyburide (nml echo [DATE])
Advanced maternal age multigravida (41),       [2V]
second trimester; low risk quad
Encounter for other antenatal screening        [2V]
follow-up
OB History

Blood Type:            Height:  5'2"   Weight (lb):  202       BMI:
Gravidity:    6         Term:   4
Ectopic:      1        Living:  4
Fetal Evaluation

Num Of Fetuses:     1
Fetal Heart         145
Rate(bpm):
Cardiac Activity:   Observed
Presentation:       Breech
Placenta:           Posterior, above cervical os
P. Cord Insertion:  Previously Visualized

Amniotic Fluid
AFI FV:      Subjectively within normal limits

Largest Pocket(cm)
5.84
Biometry

BPD:      59.5  mm     G. Age:  24w 2d          4  %    CI:        66.64   %    70 - 86
FL/HC:      20.3   %    18.6 -
HC:      233.6  mm     G. Age:  25w 3d         15  %    HC/AC:      1.08        1.04 -
AC:      216.8  mm     G. Age:  26w 1d         51  %    FL/BPD:     79.7   %    71 - 87
FL:       47.4  mm     G. Age:  25w 6d         36  %    FL/AC:      21.9   %    20 - 24
HUM:      45.2  mm     G. Age:  26w 5d         67  %

Est. FW:     861  gm    1 lb 14 oz      54  %
Gestational Age

LMP:           25w 6d        Date:  [DATE]                 EDD:   [DATE]
U/S Today:     25w 3d                                        EDD:   [DATE]
Best:          25w 6d     Det. By:  LMP  ([DATE])          EDD:   [DATE]
Anatomy

Cranium:               Appears normal         Aortic Arch:            Previously seen
Cavum:                 Appears normal         Ductal Arch:            Previously seen
Ventricles:            Appears normal         Diaphragm:              Previously seen
Choroid Plexus:        Previously seen        Stomach:                Appears normal, left
sided
Cerebellum:            Previously seen        Abdomen:                Previously seen
Posterior Fossa:       Previously seen        Abdominal Wall:         Previously seen
Nuchal Fold:           Previously seen        Cord Vessels:           Previously seen
Face:                  Orbits and profile     Kidneys:                Appear normal
previously seen
Lips:                  Previously seen        Bladder:                Appears normal
Thoracic:              Appears normal         Spine:                  Appears normal
Heart:                 Previously seen        Upper Extremities:      Previously seen
RVOT:                  Previously seen        Lower Extremities:      Previously seen
LVOT:                  Previously seen

Other:  Fetus appears to be a female prev seen. Heels and RT 5th digit prev
visualized. Nasal bone prev visualized. Technically difficult due to
maternal habitus and fetal position.
Cervix Uterus Adnexa

Cervix
Length:           6.13  cm.
Normal appearance by transabdominal scan.
Impression

SIUP at 25+6 weeks
Normal interval anatomy; anatomic survey complete
Normal amniotic fluid volume
Appropriate interval growth with EFW at the 54th %tile
Recommendations

Follow-up ultrasound for growth in 4 weeks

## 2017-12-28 ENCOUNTER — Other Ambulatory Visit (HOSPITAL_COMMUNITY): Payer: Self-pay | Admitting: *Deleted

## 2017-12-28 DIAGNOSIS — O24119 Pre-existing diabetes mellitus, type 2, in pregnancy, unspecified trimester: Secondary | ICD-10-CM

## 2017-12-28 DIAGNOSIS — Z7984 Long term (current) use of oral hypoglycemic drugs: Principal | ICD-10-CM

## 2018-01-15 ENCOUNTER — Ambulatory Visit (INDEPENDENT_AMBULATORY_CARE_PROVIDER_SITE_OTHER): Payer: Self-pay | Admitting: Family Medicine

## 2018-01-15 VITALS — BP 100/48 | HR 79 | Wt 207.4 lb

## 2018-01-15 DIAGNOSIS — Z23 Encounter for immunization: Secondary | ICD-10-CM

## 2018-01-15 DIAGNOSIS — O24919 Unspecified diabetes mellitus in pregnancy, unspecified trimester: Secondary | ICD-10-CM

## 2018-01-15 DIAGNOSIS — O34219 Maternal care for unspecified type scar from previous cesarean delivery: Secondary | ICD-10-CM

## 2018-01-15 DIAGNOSIS — O09891 Supervision of other high risk pregnancies, first trimester: Secondary | ICD-10-CM

## 2018-01-15 DIAGNOSIS — O09529 Supervision of elderly multigravida, unspecified trimester: Secondary | ICD-10-CM

## 2018-01-15 DIAGNOSIS — O0990 Supervision of high risk pregnancy, unspecified, unspecified trimester: Secondary | ICD-10-CM

## 2018-01-15 MED ORDER — GLYBURIDE 2.5 MG PO TABS: 2.5000 mg | ORAL_TABLET | Freq: Every day | ORAL | 6 refills | Status: DC

## 2018-01-15 NOTE — Progress Notes (Signed)
Subjective:  Carolyn Mendez is a 42 y.o. [id number] at 6748w4d being seen today for ongoing prenatal care.  She is currently monitored for the following issues for this high-risk pregnancy and has OBESITY; Depression; Irritable bowel disease; Supervision of high risk pregnancy, antepartum; Diabetes mellitus complicating pregnancy, antepartum; Previous cesarean delivery, antepartum; AMA (advanced maternal age) multigravida 35+; and Unwanted fertility on their problem list.  GDM: Patient taking glyburide 2.5mg  qhs.  Reports no hypoglycemic episodes.  Tolerating medication well Fasting: some 95-96 2hr PP: mostly controlled - occasional 122-126  Patient reports no complaints.  Contractions: Not present. Vag. Bleeding: None.  Movement: Present. Denies leaking of fluid.   The following portions of the patient's history were reviewed and updated as appropriate: allergies, current medications, past family history, past medical history, past social history, past surgical history and problem list. Problem list updated.  Objective:   Vitals:   01/15/18 1621  BP: (!) 100/48  Pulse: 79  Weight: 207 lb 6.4 oz (94.1 kg)    Fetal Status: Fetal Heart Rate (bpm): 134   Movement: Present     General:  Alert, oriented and cooperative. Patient is in no acute distress.  Skin: Skin is warm and dry. No rash noted.   Cardiovascular: Normal heart rate noted  Respiratory: Normal respiratory effort, no problems with respiration noted  Abdomen: Soft, gravid, appropriate for gestational age. Pain/Pressure: Present     Pelvic: Vag. Bleeding: None     Cervical exam deferred        Extremities: Normal range of motion.  Edema: None  Mental Status: Normal mood and affect. Normal behavior. Normal judgment and thought content.   Urinalysis:      Assessment and Plan:  Pregnancy: [id number] at 4348w4d  1. Supervision of other high risk pregnancies, first trimester FHT normal - Tdap vaccine greater than or equal to 7yo  IM - CBC - RPR - HIV antibody  2. Previous cesarean delivery, antepartum Previous c/s for breech with 2 VBACs.   3. Antepartum multigravida of advanced maternal age  344. Diabetes mellitus complicating pregnancy, antepartum Increase glyburide to 3.75mg  qhs.  Growth US next week. Antenatal testing at 32 weeks.  Preterm labor symptoms and general obstetric precautions including but not limited to vaginal bleeding, contractions, leaking of fluid and fetal movement were reviewed in detail with the patient. Please refer to After Visit Summary for other counseling recommendations.  Return in about 2 weeks (around 01/29/2018).   Levie HeritageStinson, Jacob J, DO

## 2018-01-16 LAB — CBC
HEMATOCRIT: 31.7 % — AB (ref 34.0–46.6)
HEMOGLOBIN: 11 g/dL — AB (ref 11.1–15.9)
MCH: 29.3 pg (ref 26.6–33.0)
MCHC: 34.7 g/dL (ref 31.5–35.7)
MCV: 85 fL (ref 79–97)
Platelets: 314 10*3/uL (ref 150–379)
RBC: 3.75 x10E6/uL — ABNORMAL LOW (ref 3.77–5.28)
RDW: 13.3 % (ref 12.3–15.4)
WBC: 7.8 10*3/uL (ref 3.4–10.8)

## 2018-01-16 LAB — HIV ANTIBODY (ROUTINE TESTING W REFLEX): HIV SCREEN 4TH GENERATION: NONREACTIVE

## 2018-01-16 LAB — RPR: RPR Ser Ql: NONREACTIVE

## 2018-01-25 ENCOUNTER — Ambulatory Visit (HOSPITAL_COMMUNITY)
Admission: RE | Admit: 2018-01-25 | Discharge: 2018-01-25 | Disposition: A | Discharge status: Home / Self Care | Payer: Self-pay | Source: Ambulatory Visit | Attending: Obstetrics & Gynecology | Admitting: Obstetrics & Gynecology

## 2018-01-25 ENCOUNTER — Other Ambulatory Visit (HOSPITAL_COMMUNITY): Payer: Self-pay | Admitting: Maternal and Fetal Medicine

## 2018-01-25 ENCOUNTER — Encounter (HOSPITAL_COMMUNITY): Payer: Self-pay

## 2018-01-25 DIAGNOSIS — O99213 Obesity complicating pregnancy, third trimester: Secondary | ICD-10-CM

## 2018-01-25 DIAGNOSIS — Z3009 Encounter for other general counseling and advice on contraception: Secondary | ICD-10-CM

## 2018-01-25 DIAGNOSIS — O24119 Pre-existing diabetes mellitus, type 2, in pregnancy, unspecified trimester: Secondary | ICD-10-CM

## 2018-01-25 DIAGNOSIS — Z362 Encounter for other antenatal screening follow-up: Secondary | ICD-10-CM

## 2018-01-25 DIAGNOSIS — O24112 Pre-existing diabetes mellitus, type 2, in pregnancy, second trimester: Secondary | ICD-10-CM

## 2018-01-25 DIAGNOSIS — Z3A30 30 weeks gestation of pregnancy: Secondary | ICD-10-CM

## 2018-01-25 DIAGNOSIS — O24113 Pre-existing diabetes mellitus, type 2, in pregnancy, third trimester: Secondary | ICD-10-CM | POA: Insufficient documentation

## 2018-01-25 DIAGNOSIS — Z98891 History of uterine scar from previous surgery: Secondary | ICD-10-CM

## 2018-01-25 DIAGNOSIS — O34219 Maternal care for unspecified type scar from previous cesarean delivery: Secondary | ICD-10-CM | POA: Insufficient documentation

## 2018-01-25 DIAGNOSIS — O09523 Supervision of elderly multigravida, third trimester: Secondary | ICD-10-CM

## 2018-01-25 DIAGNOSIS — Z7984 Long term (current) use of oral hypoglycemic drugs: Secondary | ICD-10-CM

## 2018-01-25 IMAGING — US US MFM OB FOLLOW-UP
1 series · 14 of 28 positions shown · non-contrast
Comparison: none

[Series 1: us mfm ob follow-up · 36 acquisitions, 14 frames shown]
[im 2/36]
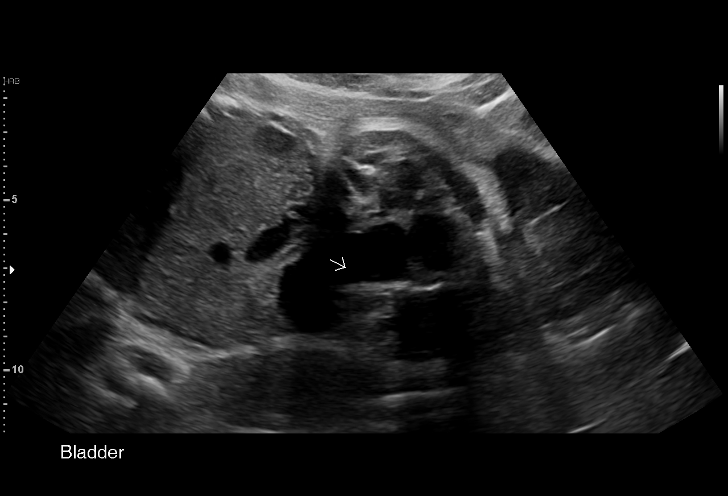
[im 4/36]
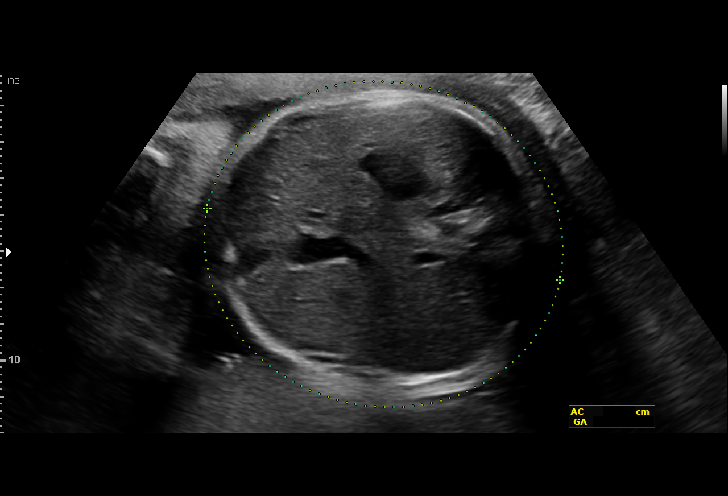
[im 7/36]
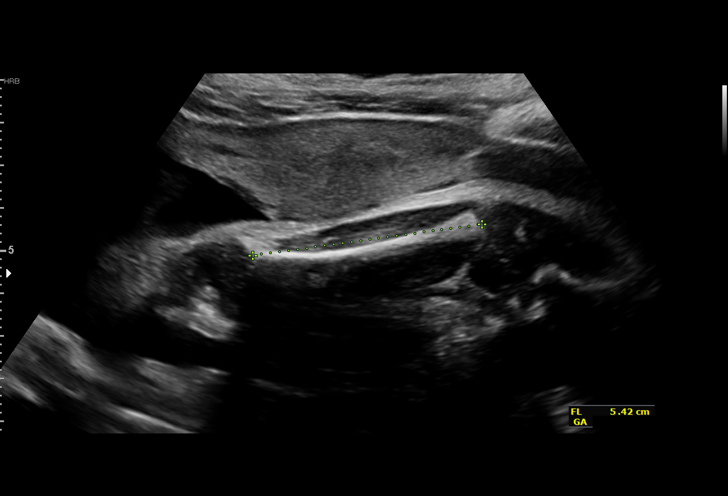
[im 10/36]
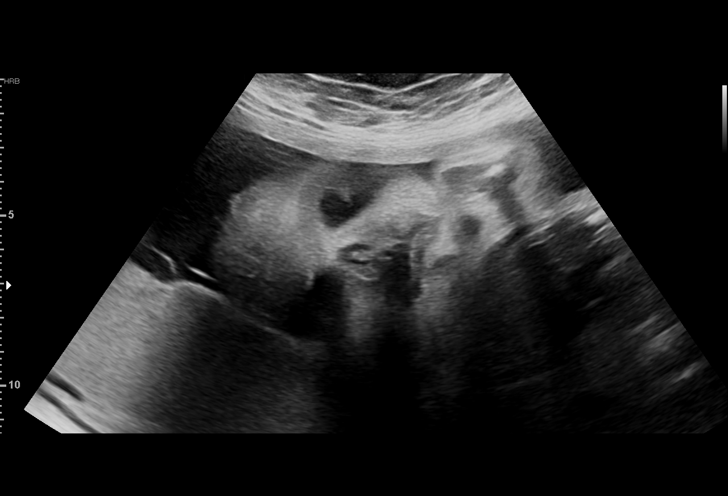
[im 12/36]
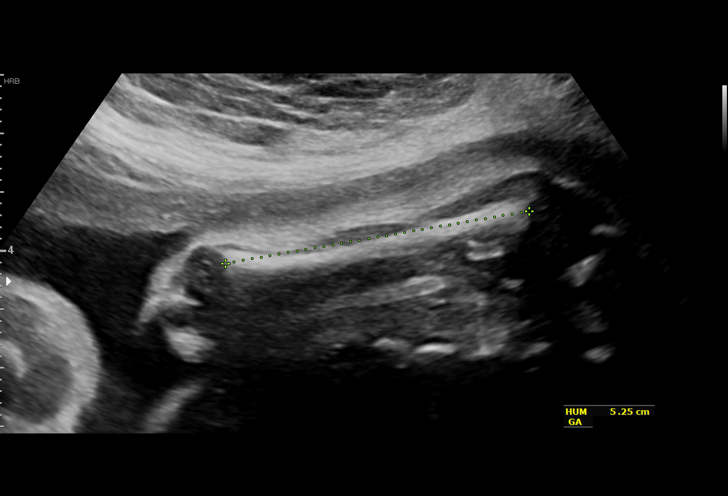
[im 15/36]
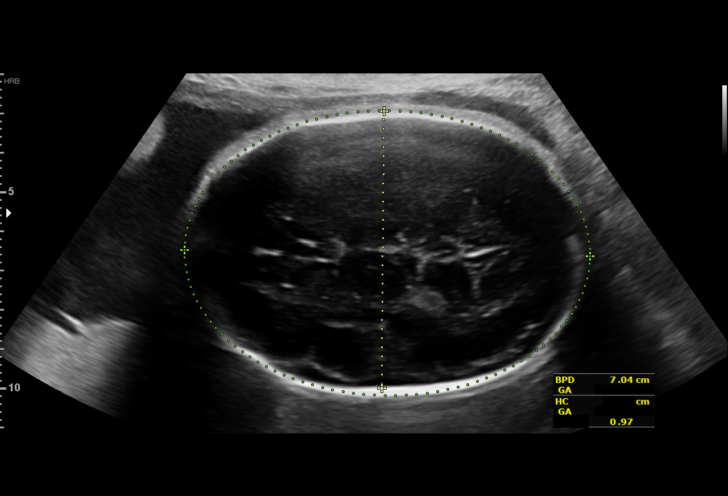
[im 17/36]
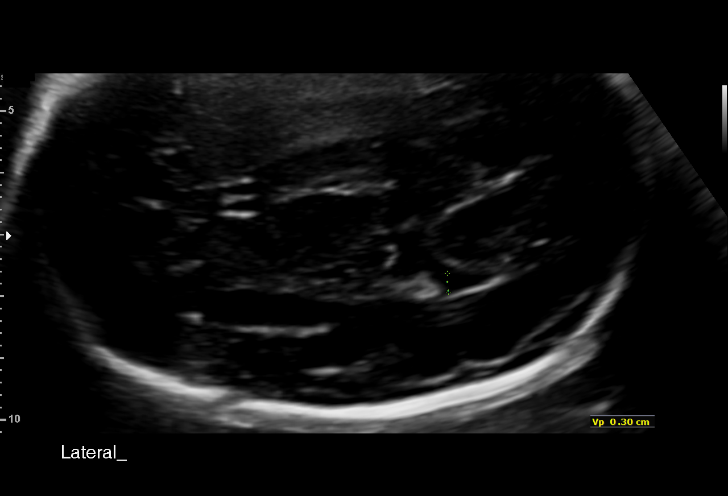
[im 20/36]
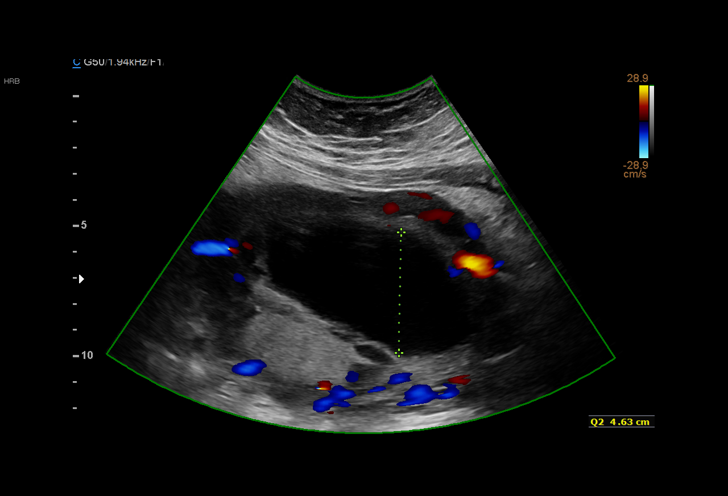
[im 23/36]
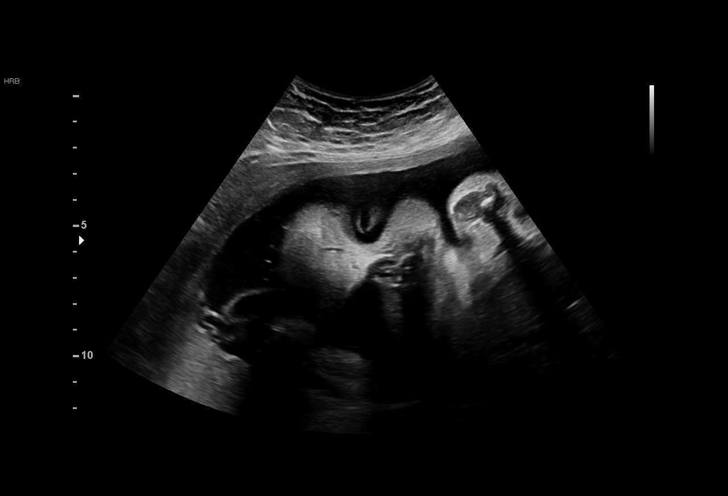
[im 25/36]
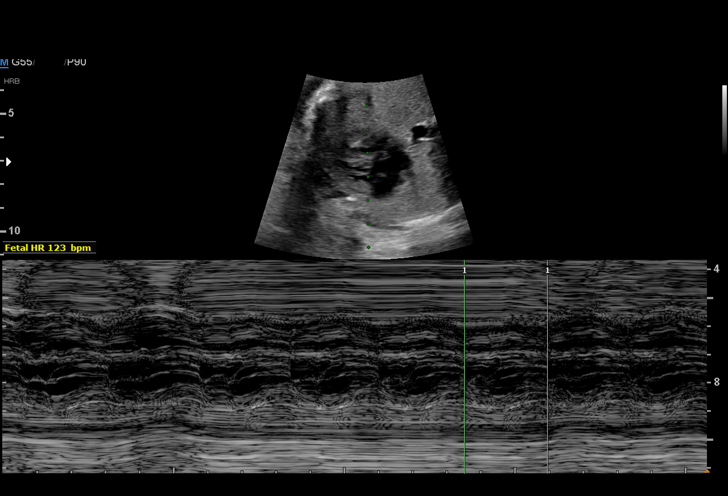
[im 28/36]
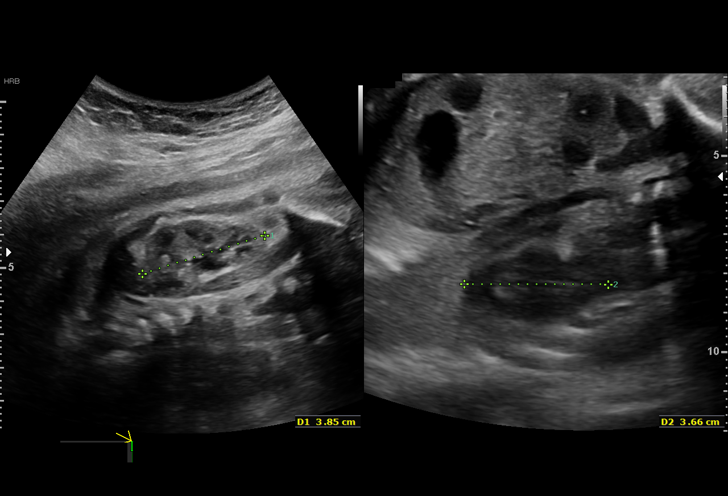
[im 30/36]
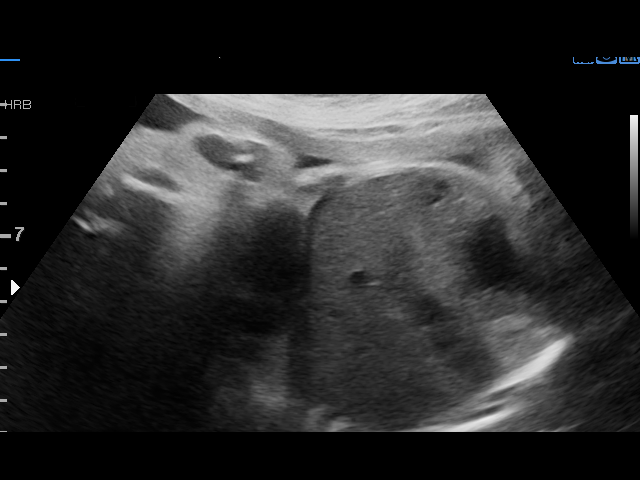
[im 33/36]
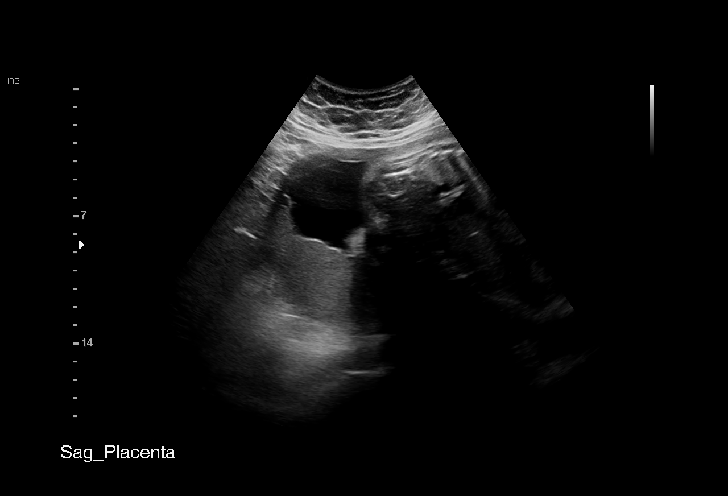
[im 36/36]
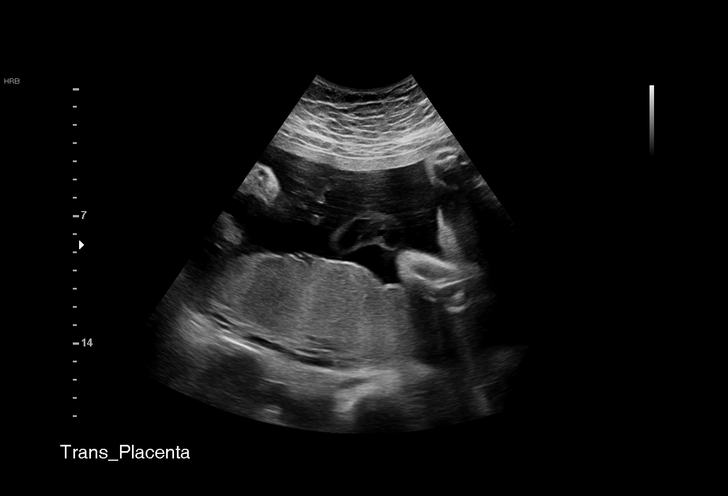

[14 of 28 positions shown; findings below may reference images not displayed]

1  B-TIM            [PHONE_NUMBER]      [PHONE_NUMBER]     [PHONE_NUMBER]
Indications

30 weeks gestation of pregnancy
History of cesarean delivery, currently        [XI]
pregnant
Pre-existing diabetes, type 2, in pregnancy,   [XI]
second trimester; glyburide (nml ECHO
[DATE])
Encounter for other antenatal screening        [XI]
follow-up
Obesity complicating pregnancy, third          [XI]
trimester  (pre preg BMI 35.1)
Advanced maternal age multigravida (41),       [XI]
third trimester; low risk quad screen
OB History

Blood Type:            Height:  5'2"   Weight (lb):  202       BMI:
Gravidity:    6         Term:   4
Ectopic:      1        Living:  4
Fetal Evaluation

Num Of Fetuses:     1
Fetal Heart         123
Rate(bpm):
Cardiac Activity:   Observed
Presentation:       Cephalic
Placenta:           Posterior, above cervical os
P. Cord Insertion:  Previously Visualized

Amniotic Fluid
AFI FV:      Subjectively within normal limits
AFI Sum(cm)     %Tile       Largest Pocket(cm)
18.74           71

RUQ(cm)       RLQ(cm)       LUQ(cm)        LLQ(cm)
5.22
Biometry

BPD:      70.2  mm     G. Age:  28w 1d          3  %    CI:         64.8   %    70 - 86
FL/HC:      19.7   %    19.2 -
HC:      280.3  mm     G. Age:  30w 5d         35  %    HC/AC:      0.96        0.99 -
AC:      291.1  mm     G. Age:  33w 1d       > 97  %    FL/BPD:     78.5   %    71 - 87
FL:       55.1  mm     G. Age:  29w 0d         14  %    FL/AC:      18.9   %    20 - 24
HUM:      52.5  mm     G. Age:  30w 4d         64  %

Est. FW:    [XI]  gm    3 lb 13 oz      76  %
Gestational Age

LMP:           30w 0d        Date:  [DATE]                 EDD:   [DATE]
U/S Today:     30w 2d                                        EDD:   [DATE]
Best:          30w 0d     Det. By:  LMP  ([DATE])          EDD:   [DATE]
Anatomy

Cranium:               Appears normal         Aortic Arch:            Previously seen
Cavum:                 Appears normal         Ductal Arch:            Previously seen
Ventricles:            Appears normal         Diaphragm:              Appears normal
Choroid Plexus:        Previously seen        Stomach:                Appears normal, left
sided
Cerebellum:            Previously seen        Abdomen:                Appears normal
Posterior Fossa:       Previously seen        Abdominal Wall:         Previously seen
Nuchal Fold:           Previously seen        Cord Vessels:           Previously seen
Face:                  Orbits and profile     Kidneys:                Previously seen
previously seen
Lips:                  Appears normal         Bladder:                Appears normal
Thoracic:              Appears normal         Spine:                  Previously seen
Heart:                 Appears normal         Upper Extremities:      Previously seen
(4CH, axis, and situs
RVOT:                  Previously seen        Lower Extremities:      Previously seen
LVOT:                  Previously seen

Other:  Fetus appears to be a female prev seen. Heels and RT 5th digit prev
visualized. Nasal bone prev visualized. Technically difficult due to
maternal habitus and fetal position.
Cervix Uterus Adnexa

Cervix
Not visualized (advanced GA >[XI])
Impression

SIUP at 30+0 weeks
Normal interval anatomy; anatomic survey complete
Normal amniotic fluid volume
Appropriate interval growth with EFW at the 76th %tile; AC >
97th %tile
Recommendations

Follow-up ultrasound for growth in 4 weeks

## 2018-01-26 ENCOUNTER — Other Ambulatory Visit (HOSPITAL_COMMUNITY): Payer: Self-pay | Admitting: *Deleted

## 2018-01-26 DIAGNOSIS — O24119 Pre-existing diabetes mellitus, type 2, in pregnancy, unspecified trimester: Secondary | ICD-10-CM

## 2018-01-26 DIAGNOSIS — Z7984 Long term (current) use of oral hypoglycemic drugs: Principal | ICD-10-CM

## 2018-01-30 ENCOUNTER — Encounter: Payer: Self-pay | Admitting: *Deleted

## 2018-02-01 ENCOUNTER — Ambulatory Visit (INDEPENDENT_AMBULATORY_CARE_PROVIDER_SITE_OTHER): Payer: Self-pay | Admitting: Obstetrics and Gynecology

## 2018-02-01 VITALS — BP 103/59 | HR 87 | Wt 207.0 lb

## 2018-02-01 DIAGNOSIS — O0990 Supervision of high risk pregnancy, unspecified, unspecified trimester: Secondary | ICD-10-CM

## 2018-02-01 DIAGNOSIS — O0993 Supervision of high risk pregnancy, unspecified, third trimester: Secondary | ICD-10-CM

## 2018-02-01 DIAGNOSIS — O34219 Maternal care for unspecified type scar from previous cesarean delivery: Secondary | ICD-10-CM

## 2018-02-01 DIAGNOSIS — O24913 Unspecified diabetes mellitus in pregnancy, third trimester: Secondary | ICD-10-CM

## 2018-02-01 DIAGNOSIS — O24919 Unspecified diabetes mellitus in pregnancy, unspecified trimester: Secondary | ICD-10-CM

## 2018-02-01 MED ORDER — RANITIDINE HCL 150 MG PO CAPS: 150.0000 mg | ORAL_CAPSULE | Freq: Two times a day (BID) | ORAL | 3 refills | Status: DC

## 2018-02-01 NOTE — Progress Notes (Signed)
Pt stated having bad acid reflux for 2 weeks

## 2018-02-02 NOTE — Progress Notes (Signed)
Prenatal Visit Note Date: 02/01/2018 [facility]  Subjective:  Carolyn Mendez is a 42 y.o. [id number] at 1940w0d being seen today for ongoing prenatal care.  She is currently monitored for the following issues for this high-risk pregnancy and has OBESITY; Depression; Irritable bowel disease; Supervision of high risk pregnancy, antepartum; Diabetes mellitus complicating pregnancy, antepartum; Previous cesarean delivery, antepartum; AMA (advanced maternal age) multigravida 35+; and Unwanted fertility on their problem list.  Patient reports gerd.   Contractions: Not present. Vag. Bleeding: None.  Movement: Present. Denies leaking of fluid.   The following portions of the patient's history were reviewed and updated as appropriate: allergies, current medications, past family history, past medical history, past social history, past surgical history and problem list. Problem list updated.  Objective:   Vitals:   02/01/18 1636  BP: (!) 103/59  Pulse: 87  Weight: 207 lb (93.9 kg)    Fetal Status: Fetal Heart Rate (bpm): 140   Movement: Present     General:  Alert, oriented and cooperative. Patient is in no acute distress.  Skin: Skin is warm and dry. No rash noted.   Cardiovascular: Normal heart rate noted  Respiratory: Normal respiratory effort, no problems with respiration noted  Abdomen: Soft, gravid, appropriate for gestational age. Pain/Pressure: Present     Pelvic:  Cervical exam deferred        Extremities: Normal range of motion.  Edema: None  Mental Status: Normal mood and affect. Normal behavior. Normal judgment and thought content.   Urinalysis:      Assessment and Plan:  Pregnancy: [id number] at 4640w0d  1. Diabetes mellitus complicating pregnancy, antepartum Normal BS log with glyburide 2.5 qhs. Start ap testing 1wk. 1/31 normal except for large ac.   2. Supervision of high risk pregnancy, antepartum Routine car. Recommend zantac over tums  3.  Previous cesarean delivery, antepartum D/w her and she is desiring VBAC again. Can sign consent nv  Preterm labor symptoms and general obstetric precautions including but not limited to vaginal bleeding, contractions, leaking of fluid and fetal movement were reviewed in detail with the patient. Please refer to After Visit Summary for other counseling recommendations.  Return in about 1 week (around 02/08/2018) for hrob, nst, bpp.   [place] BingPickens, Derron Pipkins, MD

## 2018-02-08 ENCOUNTER — Ambulatory Visit (INDEPENDENT_AMBULATORY_CARE_PROVIDER_SITE_OTHER): Payer: Self-pay | Admitting: Family Medicine

## 2018-02-08 ENCOUNTER — Ambulatory Visit: Payer: Self-pay

## 2018-02-08 VITALS — BP 104/59 | HR 85 | Wt 207.5 lb

## 2018-02-08 DIAGNOSIS — O24919 Unspecified diabetes mellitus in pregnancy, unspecified trimester: Secondary | ICD-10-CM

## 2018-02-08 DIAGNOSIS — O34219 Maternal care for unspecified type scar from previous cesarean delivery: Secondary | ICD-10-CM

## 2018-02-08 DIAGNOSIS — O0990 Supervision of high risk pregnancy, unspecified, unspecified trimester: Secondary | ICD-10-CM

## 2018-02-08 IMAGING — US US FETAL BPP W/ NON-STRESS
1 series · 13 of 14 positions shown · non-contrast
Comparison: none

[Series 1: us fetal bpp w/nonstress · 14 acquisitions, 13 frames shown]
[im 1/14]
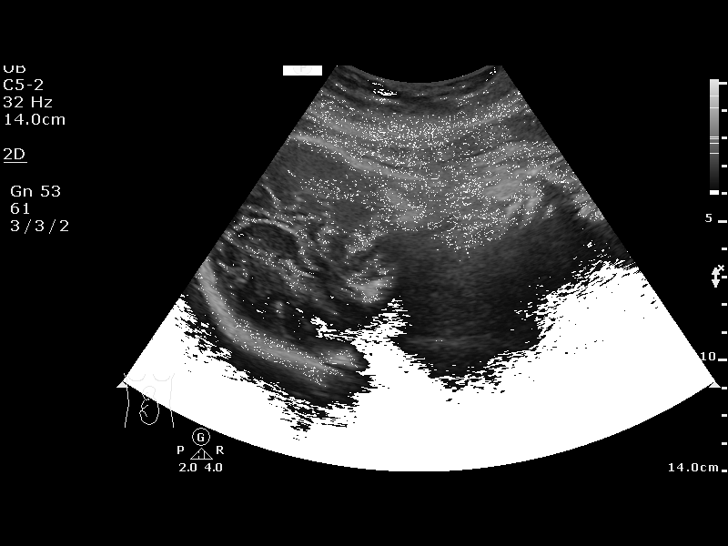
[im 2/14]
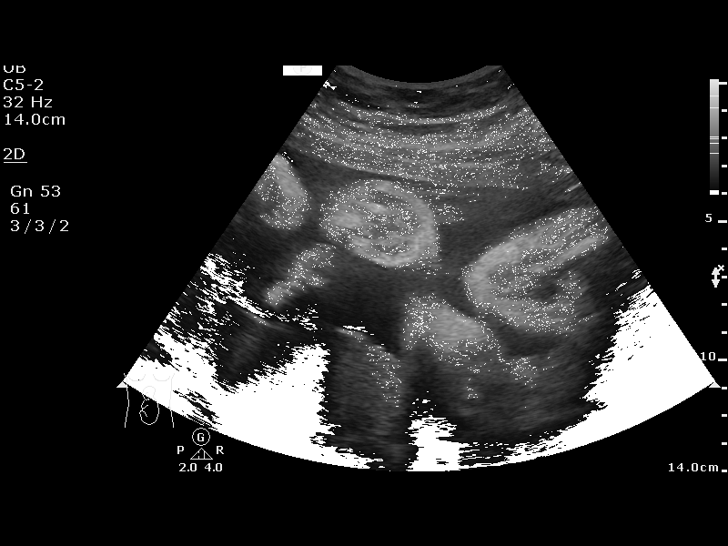
[im 3/14]
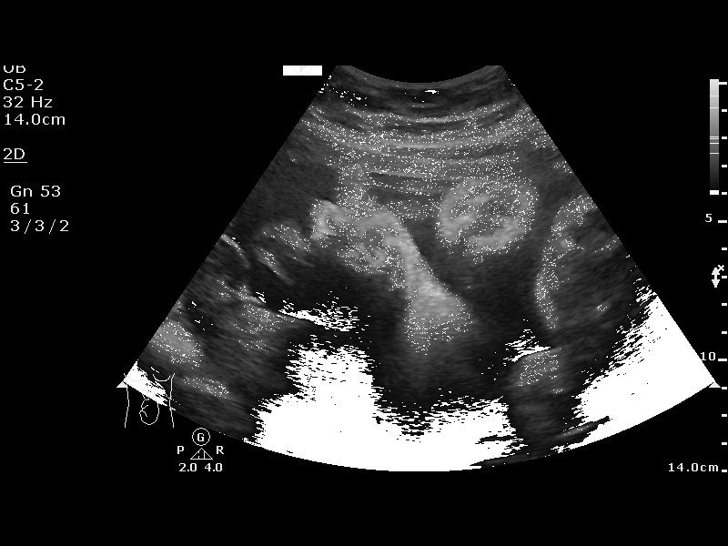
[im 4/14]
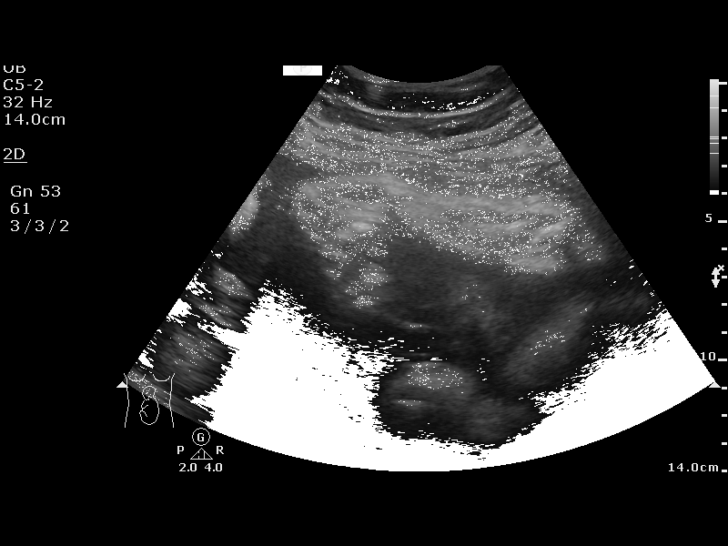
[im 5/14]
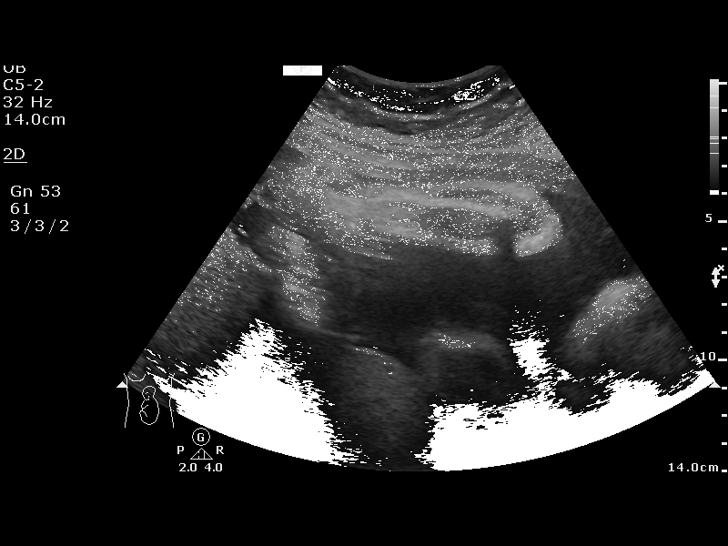
[im 6/14]
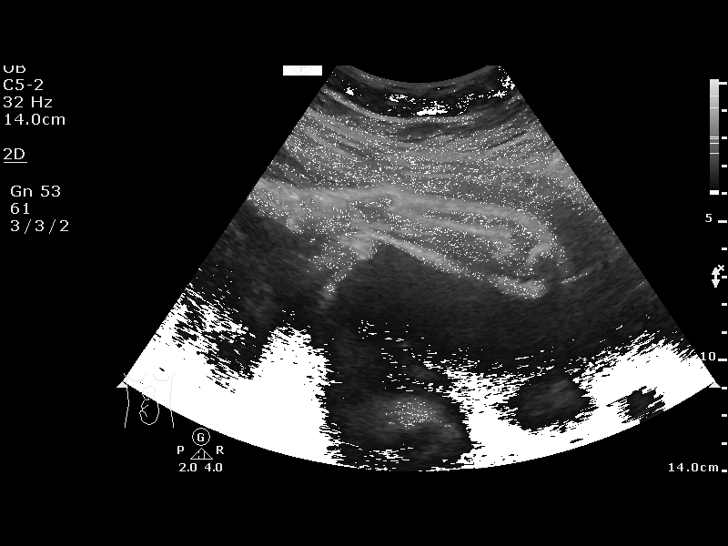
[im 8/14]
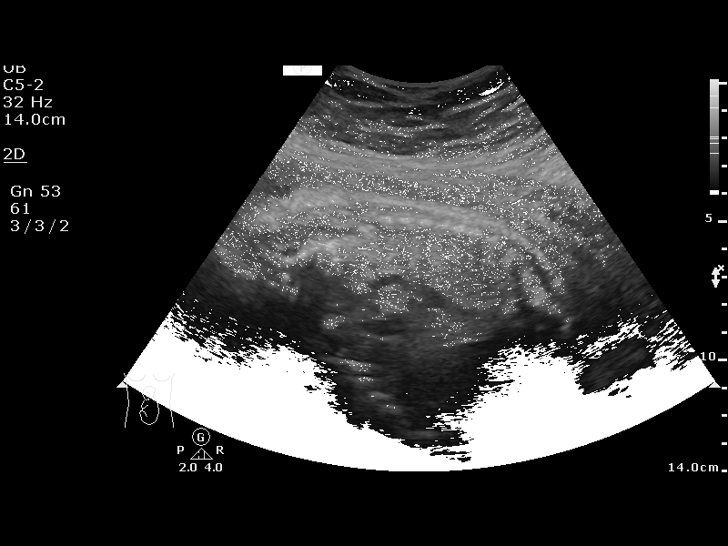
[im 9/14]
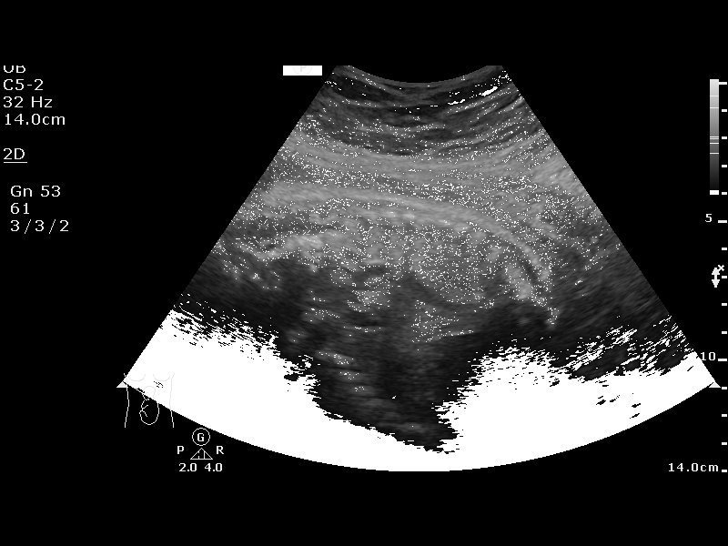
[im 10/14]
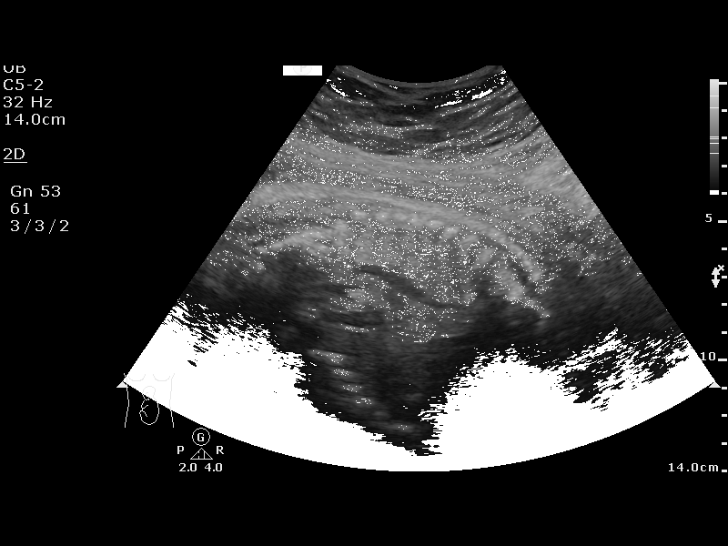
[im 11/14]
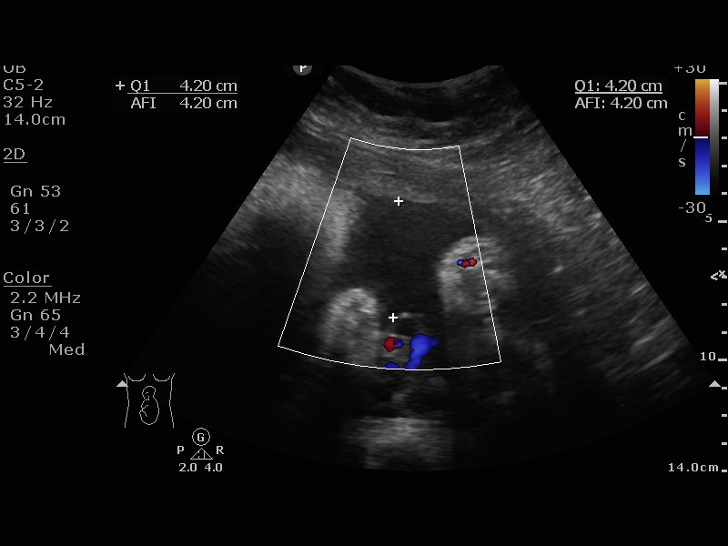
[im 12/14]
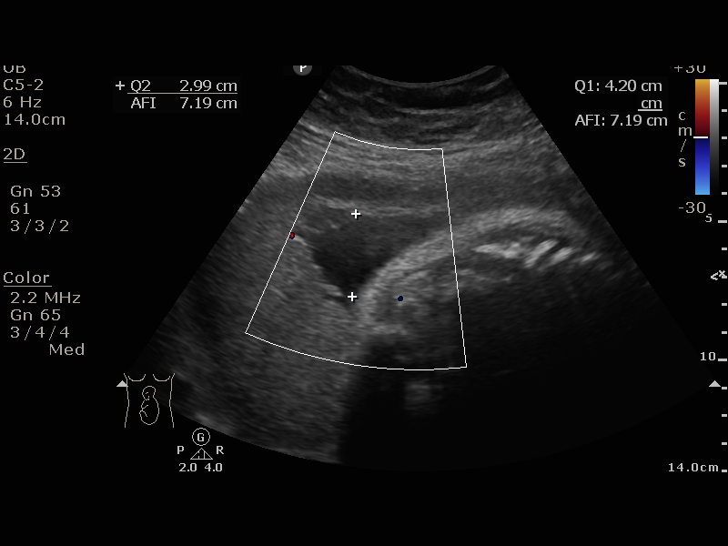
[im 13/14]
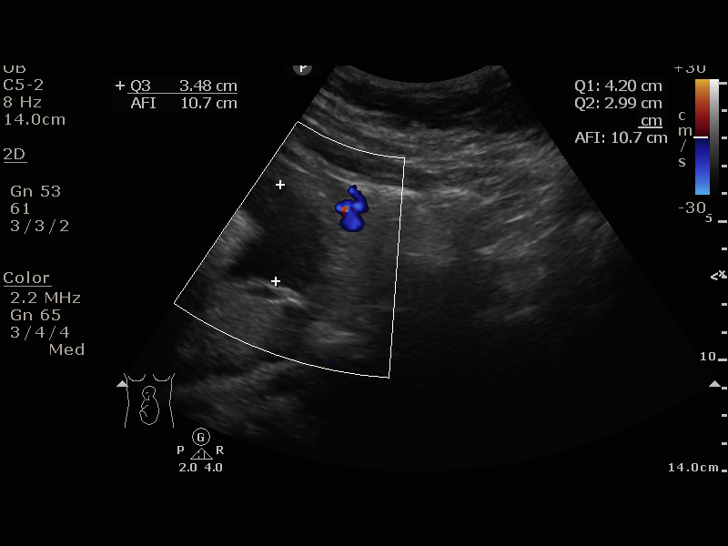
[im 14/14]
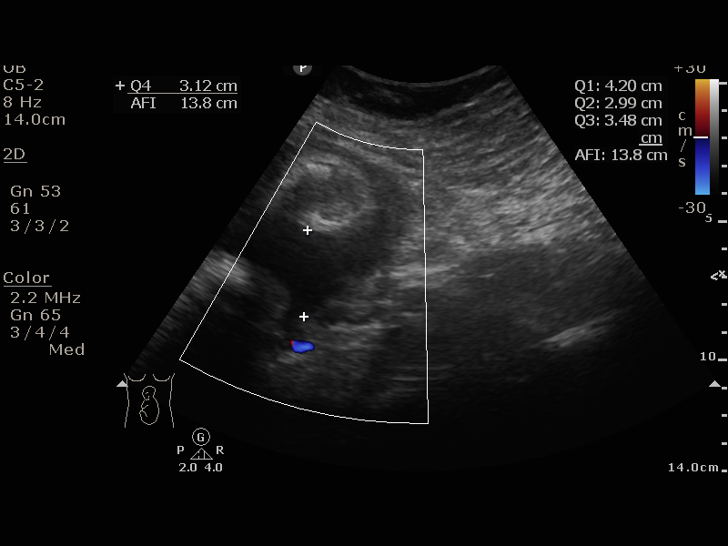

[13 of 14 positions shown; findings below may reference images not displayed]

Women's
[REDACTED]

1  US FETAL BPP W/NONSTRESS                    76818.4

1  [facility]              [PHONE_NUMBER]      [PHONE_NUMBER]     [PHONE_NUMBER]
Service(s) Provided

Indications

32 weeks gestation of pregnancy
Pre-existing diabetes, type 2, in pregnancy,   [8G]
third trimester
OB History

Blood Type:            Height:  5'2"   Weight (lb):  202       BMI:
Gravidity:    6         Term:   4
Ectopic:      1        Living:  4
Fetal Evaluation

Num Of Fetuses:     1
Preg. Location:     Intrauterine
Cardiac Activity:   Observed
Presentation:       Frank breech

Amniotic Fluid
AFI FV:      Subjectively within normal limits

AFI Sum(cm)     %Tile       Largest Pocket(cm)
13.79           45

RUQ(cm)       RLQ(cm)       LUQ(cm)        LLQ(cm)
4.2
Biophysical Evaluation

Amniotic F.V:   Pocket => 2 cm two         F. Tone:        Observed
planes
F. Movement:    Observed                   N.S.T:          Reactive
F. Breathing:   Observed                   Score:          [DATE]
Gestational Age

LMP:           32w 0d        Date:  [DATE]                 EDD:   [DATE]
Best:          32w 0d     Det. By:  LMP  ([DATE])          EDD:   [DATE]
Impression

BPP [DATE]
Recommendations

Continue antepartum testing.

## 2018-02-08 MED ORDER — GLYBURIDE 2.5 MG PO TABS: 3.7500 mg | ORAL_TABLET | Freq: Every day | ORAL | 6 refills | Status: DC

## 2018-02-08 NOTE — Progress Notes (Signed)
Spanish interpreter Delila [phone] used for visit

## 2018-02-08 NOTE — Patient Instructions (Signed)
Eleccin del mtodo anticonceptivo Contraception Choices La anticoncepcin, o los mtodos anticonceptivos, hace referencia a los mtodos o dispositivos que evitan el embarazo. Mtodos hormonales Implante anticonceptivo Un implante anticonceptivo consiste en un tubo plstico delgado que contiene una hormona. Se inserta en la parte superior del brazo. Puede permanecer en el lugar hasta por 3 aos. Inyecciones de progestina sola Las inyecciones de progestina sola contienen progestina, una forma sinttica de la hormona progesterona. Un mdico las administra cada 3 meses. Pldoras anticonceptivas Las pldoras anticonceptivas son pastillas que contienen hormonas que evitan el embarazo. Deben tomarse una vez al da, preferentemente a la misma hora cada da. Parches anticonceptivos El parche anticonceptivo contiene hormonas que evitan el embarazo. Se coloca en la piel, debe cambiarse una vez a la semana durante tres semanas y debe retirarse en la cuarta semana. Se necesita una receta para utilizar este mtodo anticonceptivo. Anillo vaginal Un anillo vaginal contiene hormonas que evitan el embarazo. Se coloca en la vagina durante tres semanas y se retira en la cuarta semana. Luego se repite el proceso con un anillo nuevo. Se necesita una receta para utilizar este mtodo anticonceptivo. Anticonceptivo de emergencia Los anticonceptivos de emergencia evitan el embarazo despus de tener sexo sin proteccin. Vienen en forma de pldora y pueden tomarse hasta 5 das despus de tener sexo. Funcionan mejor cuando se toman lo ms pronto posible luego de tener sexo. La mayora de los anticonceptivos de emergencia estn disponibles sin receta mdica. Este mtodo no debe utilizarse como el nico mtodo anticonceptivo. Mtodos de barrera Preservativo masculino Un preservativo masculino es una vaina delgada que se coloca sobre el pene durante el sexo. Los preservativos evitan que el esperma ingrese en el cuerpo de la  mujer. Pueden utilizarse con un espermicida para aumentar la efectividad. Deben desecharse luego de su uso. Preservativo femenino Un preservativo femenino es una vaina blanda y holgada que se coloca en la vagina antes de tener sexo. El preservativo evita que el esperma ingrese en el cuerpo de la mujer. Deben desecharse luego de su uso. Diafragma Un diafragma es una barrera blanda con forma de cpula. Se inserta en la vagina antes del sexo, junto con un espermicida. El diafragma bloquea el ingreso de esperma en el tero, y el espermicida mata a los espermatozoides. El diafragma debe permanecer en la vagina durante 6 a 8 horas despus de tener sexo y debe retirarse en el plazo de las 24 horas. Un diafragma es recetado y colocado por un mdico. Debe reemplazarse cada 1 a 2 aos, despus de dar a luz, de aumentar ms de 15lb (6.8kg) y de una ciruga plvica. Capuchn cervical Un capuchn cervical es una copa redonda y blanda de ltex o plstico que se coloca en el cuello uterino. Se inserta en la vagina antes del sexo, junto con un espermicida. Bloquea el ingreso del esperma en el tero. El capuchn debe permanecer en el lugar durante 6 a 8 horas despus de tener sexo y debe retirarse en el plazo de las 48 horas. Un capuchn cervical debe ser recetado y colocado por un mdico. Debe reemplazarse cada 2aos. Esponja Una esponja es una pieza blanda y circular de espuma de poliuretano que contiene espermicida. La esponja ayuda a bloquear el ingreso de esperma en el tero, y el espermicida mata a los espermatozoides. Para utilizarla, debe humedecerla e insertarla en la vagina. Debe insertarse antes de tener sexo, debe permanecer dentro al menos durante 6 horas despus de tener sexo y debe retirarse y desecharse en   el plazo de las 30 horas. Espermicidas Los espermicidas son sustancias qumicas que matan o bloquean al esperma y no lo dejan ingresar al cuello uterino y al tero. Vienen en forma de crema, gel,  supositorio, espuma o comprimido. Un espermicida debe insertarse en la vagina con un aplicador al menos 10 o 15 minutos antes de tener sexo para dar tiempo a que surta efecto. El proceso debe repetirse cada vez que tenga sexo. Los espermicidas no requieren receta mdica. Anticonceptivos intrauterinos Dispositivo intrauterino (DIU). Un DIU un dispositivo en forma de T que se coloca en el tero. Hay dos tipos:  DIU hormonal. Este tipo contiene progestina, una forma sinttica de la hormona progesterona. Este tipo puede permanecer colocado durante 3 a 5 aos.  DIU de cobre. Este tipo est recubierto con un alambre de cobre. Puede permanecer colocado durante 10 aos.  Mtodos anticonceptivos permanentes Ligadura de trompas en la mujer En este mtodo, se sellan, atan u obstruyen las trompas de Falopio durante una ciruga para evitar que el vulo descienda hacia el tero. Esterilizacin histeroscpica En este mtodo, se coloca un implante pequeo y flexible dentro de cada trompa de Falopio. Los implantes hacen que se forme un tejido cicatricial en las trompas de Falopio y que las obstruya para que el espermatozoide no pueda llegar al vulo. El procedimiento demora alrededor de 3 meses para que sea efectivo. Debe utilizarse otro mtodo anticonceptivo durante esos 3 meses. Esterilizacin masculina Este es un procedimiento que consiste en atar los conductos que transportan el esperma (vasectoma). Luego del procedimiento, el hombre puede eyacular lquido (semen). Mtodos de planificacin natural Planificacin familiar natural En este mtodo, la pareja no tiene sexo durante los das en que la mujer podra quedar embarazada. Mtodo calendario Esto significa realizar un seguimiento de la duracin de cada ciclo menstrual, identificar los das en los que se puede producir un embarazo y no tener sexo durante esos das. Mtodo de la ovulacin En este mtodo, la pareja evita tener sexo durante la  ovulacin. Mtodo sintotrmico Este mtodo implica no tener sexo durante la ovulacin. Normalmente, la mujer comprueba la ovulacin al observar cambios en su temperatura y en la consistencia del moco cervical. Mtodo posovulacin En este mtodo, la pareja espera a que finalice la ovulacin para tener sexo. Resumen  La anticoncepcin, o los mtodos anticonceptivos, hace referencia a los mtodos o dispositivos que evitan el embarazo.  Los mtodos anticonceptivos hormonales incluyen implantes, inyecciones, pastillas, parches, anillos vaginales y anticonceptivos de emergencia.  Los mtodos anticonceptivos de barrera pueden incluir preservativos masculinos, preservativos femeninos, diafragmas, capuchones cervicales, esponjas y espermicidas.  Hay dos tipos de DIU (dispositivos intrauterinos). Un DIU puede colocarse en el tero de una mujer para evitar el embarazo durante 3 a 5 aos.  La esterilizacin permanente puede realizarse mediante un procedimiento para hombres, mujeres o ambos.  Los mtodos de planificacin familiar natural incluyen no tener sexo durante los das en que la mujer podra quedar embarazada. Esta informacin no tiene como fin reemplazar el consejo del mdico. Asegrese de hacerle al mdico cualquier pregunta que tenga. Document Released: 12/12/2005 Document Revised: 04/03/2017 Document Reviewed: 04/03/2017 Elsevier Interactive Patient Education  2018 [employer] Lactancia materna Breastfeeding Decidir amamantar es una de las mejores elecciones que puede hacer por usted y su beb. Un cambio en las hormonas durante el embarazo hace que las mamas produzcan leche materna en las glndulas productoras de leche. Las hormonas impiden que la leche materna sea liberada antes del nacimiento del beb. Adems, impulsan el   flujo de leche luego del nacimiento. Una vez que ha comenzado a amamantar, pensar en el beb, as como la succin o el llanto, pueden estimular la liberacin de leche de  las glndulas productoras de leche. Los beneficios de amamantar Las investigaciones demuestran que la lactancia materna ofrece muchos beneficios de salud para bebs y madres. Adems, ofrece una forma gratuita y conveniente de alimentar al beb. Para el beb  La primera leche (calostro) ayuda a mejorar el funcionamiento del aparato digestivo del beb.  Las clulas especiales de la leche (anticuerpos) ayudan a combatir las infecciones en el beb.  Los bebs que se alimentan con leche materna tambin tienen menos probabilidades de tener asma, alergias, obesidad o diabetes de tipo 2. Adems, tienen menor riesgo de sufrir el sndrome de muerte sbita del lactante (SMSL).  Los nutrientes de la leche materna son mejores para satisfacer las necesidades del beb en comparacin con la leche maternizada.  La leche materna mejora el desarrollo cerebral del beb. Para usted  La lactancia materna favorece el desarrollo de un vnculo muy especial entre la madre y el beb.  Es conveniente. La leche materna es econmica y siempre est disponible a la temperatura correcta.  La lactancia materna ayuda a quemar caloras. Le ayuda a perder el peso ganado durante el embarazo.  Hace que el tero vuelva al tamao que tena antes del embarazo ms rpido. Adems, disminuye el sangrado (loquios) despus del parto.  La lactancia materna contribuye a reducir el riesgo de tener diabetes de tipo 2, osteoporosis, artritis reumatoide, enfermedades cardiovasculares y cncer de mama, ovario, tero y endometrio en el futuro. Informacin bsica sobre la lactancia Comienzo de la lactancia  Encuentre un lugar cmodo para sentarse o acostarse, con un buen respaldo para el cuello y la espalda.  Coloque una almohada o una manta enrollada debajo del beb para acomodarlo a la altura de la mama (si est sentada). Las almohadas para amamantar se han diseado especialmente a fin de servir de apoyo para los brazos y el beb mientras  amamanta.  Asegrese de que la barriga del beb (abdomen) est frente a la suya.  Masajee suavemente la mama. Con las yemas de los dedos, masajee los bordes exteriores de la mama hacia adentro, en direccin al pezn. Esto estimula el flujo de leche. Si la leche fluye lentamente, es posible que deba continuar con este movimiento durante la lactancia.  Sostenga la mama con 4 dedos por debajo y el pulgar por arriba del pezn (forme la letra "C" con la mano). Asegrese de que los dedos se encuentren lejos del pezn y de la boca del beb.  Empuje suavemente los labios del beb con el pezn o con el dedo.  Cuando la boca del beb se abra lo suficiente, acrquelo rpidamente a la mama e introduzca todo el pezn y la arola, tanto como sea posible, dentro de la boca del beb. La arola es la zona de color que rodea al pezn. ? Debe haber ms arola visible por arriba del labio superior del beb que por debajo del labio inferior. ? Los labios del beb deben estar abiertos y extendidos hacia afuera (evertidos) para asegurar que el beb se prenda de forma adecuada y cmoda. ? La lengua del beb debe estar entre la enca inferior y la mama.  Asegrese de que la boca del beb est en la posicin correcta alrededor del pezn (prendido). Los labios del beb deben crear un sello sobre la mama y estar doblados hacia afuera (invertidos).    Es comn que el beb succione durante 2 a 3 minutos para que comience el flujo de leche materna. Cmo debe prenderse Es muy importante que le ensee al beb cmo prenderse adecuadamente a la mama. Si el beb no se prende adecuadamente, puede causar dolor en los pezones, reducir la produccin de leche materna y hacer que el beb tenga un escaso aumento de peso. Adems, si el beb no se prende adecuadamente al pezn, puede tragar aire durante la alimentacin. Esto puede causarle molestias al beb. Hacer eructar al beb al cambiar de mama puede ayudarlo a liberar el aire. Sin  embargo, ensearle al beb cmo prenderse a la mama adecuadamente es la mejor manera de evitar que se sienta molesto por tragar aire mientras se alimenta. Signos de que el beb se ha prendido adecuadamente al pezn  Tironea o succiona de modo silencioso, sin causarle dolor. Los labios del beb deben estar extendidos hacia afuera (evertidos).  Se escucha que traga cada 3 o 4 succiones una vez que la leche ha comenzado a fluir (despus de que se produzca el reflejo de eyeccin de la leche).  Hay movimientos musculares por arriba y por delante de sus odos al succionar.  Signos de que el beb no se ha prendido adecuadamente al pezn  Hace ruidos de succin o de chasquido mientras se alimenta.  Siente dolor en los pezones.  Si cree que el beb no se prendi correctamente, deslice el dedo en la comisura de la boca y colquelo entre las encas del beb para interrumpir la succin. Intente volver a comenzar a amamantar. Signos de lactancia materna exitosa Signos del beb  El beb disminuir gradualmente el nmero de succiones o dejar de succionar por completo.  El beb se quedar dormido.  El cuerpo del beb se relajar.  El beb retendr una pequea cantidad de leche en la boca.  El beb se desprender solo del pecho.  Signos que presenta usted  Las mamas han aumentado la firmeza, el peso y el tamao 1 a 3 horas despus de amamantar.  Estn ms blandas inmediatamente despus de amamantar.  Se producen un aumento del volumen de leche y un cambio en su consistencia y color hacia el quinto da de lactancia.  Los pezones no duelen, no estn agrietados ni sangran.  Signos de que su beb recibe la cantidad de leche suficiente  Mojar por lo menos 1 o 2paales durante las primeras 24horas despus del nacimiento.  Mojar por lo menos 5 o 6paales cada 24horas durante la primera semana despus del nacimiento. La orina debe ser clara o de color amarillo plido a los 5das de  vida.  Mojar entre 6 y 8paales cada 24horas a medida que el beb sigue creciendo y desarrollndose.  Defeca por lo menos 3 veces en 24 horas a los 5 das de vida. Las heces deben ser blandas y amarillentas.  Defeca por lo menos 3 veces en 24 horas a los 7 das de vida. Las heces deben ser grumosas y amarillentas.  No registra una prdida de peso mayor al 10% del peso al nacer durante los primeros 3 das de vida.  Aumenta de peso un promedio de 4 a 7onzas (113 a 198g) por semana despus de los 4 das de vida.  Aumenta de peso, diariamente, de manera uniforme a partir de los 5 das de vida, sin registrar prdida de peso despus de las 2semanas de vida. Despus de alimentarse, es posible que el beb regurgite una pequea cantidad de leche.   Esto es normal. Frecuencia y duracin de la lactancia El amamantamiento frecuente la ayudar a producir ms leche y puede prevenir dolores en los pezones y las mamas extremadamente llenas (congestin mamaria). Alimente al beb cuando muestre signos de hambre o si siente la necesidad de reducir la congestin de las mamas. Esto se denomina "lactancia a demanda". Las seales de que el beb tiene hambre incluyen las siguientes:  Aumento del estado de alerta, actividad o inquietud.  Mueve la cabeza de un lado a otro.  Abre la boca cuando se le toca la mejilla o la comisura de la boca (reflejo de bsqueda).  Aumenta las vocalizaciones, tales como sonidos de succin, se relame los labios, emite arrullos, suspiros o chirridos.  Mueve la mano hacia la boca y se chupa los dedos o las manos.  Est molesto o llora.  Evite el uso del chupete en las primeras 4 a 6 semanas despus del nacimiento del beb. Despus de este perodo, podr usar un chupete. Las investigaciones demostraron que el uso del chupete durante el primer ao de vida del beb disminuye el riesgo de tener el sndrome de muerte sbita del lactante (SMSL). Permita que el nio se alimente en cada  mama todo lo que desee. Cuando el beb se desprende o se queda dormido mientras se est alimentando de la primera mama, ofrzcale la segunda. Debido a que, con frecuencia, los recin nacidos estn somnolientos las primeras semanas de vida, es posible que deba despertar al beb para alimentarlo. Los horarios de lactancia varan de un beb a otro. Sin embargo, las siguientes reglas pueden servir como gua para ayudarla a garantizar que el beb se alimenta adecuadamente:  Se puede amamantar a los recin nacidos (bebs de 4 semanas o menos de vida) cada 1 a 3 horas.  No deben transcurrir ms de 3 horas durante el da o 5 horas durante la noche sin que se amamante a los recin nacidos.  Debe amamantar al beb un mnimo de 8 veces en un perodo de 24 horas.  Extraccin de leche materna La extraccin y el almacenamiento de la leche materna le permiten asegurarse de que el beb se alimente exclusivamente de su leche materna, aun en momentos en los que no puede amamantar. Esto tiene especial importancia si debe regresar al trabajo en el perodo en que an est amamantando o si no puede estar presente en los momentos en que el beb debe alimentarse. Su asesor en lactancia puede ayudarla a encontrar un mtodo de extraccin que funcione mejor para usted y orientarla sobre cunto tiempo es seguro almacenar leche materna. Cmo cuidar las mamas durante la lactancia Los pezones pueden secarse, agrietarse y doler durante la lactancia. Las siguientes recomendaciones pueden ayudarla a mantener las mamas humectadas y sanas:  Evite usar jabn en los pezones.  Use un sostn de soporte diseado especialmente para la lactancia materna. Evite usar sostenes con aro o sostenes muy ajustados (sostenes deportivos).  Seque al aire sus pezones durante 3 a 4minutos despus de amamantar al beb.  Utilice solo apsitos de algodn en el sostn para absorber las prdidas de leche. La prdida de un poco de leche materna entre las  tomas es normal.  Utilice lanolina sobre los pezones luego de amamantar. La lanolina ayuda a mantener la humedad normal de la piel. La lanolina pura no es perjudicial (no es txica) para el beb. Adems, puede extraer manualmente algunas gotas de leche materna y masajear suavemente esa leche sobre los pezones para que la leche   se seque al aire.  Durante las primeras semanas despus del nacimiento, algunas mujeres experimentan congestin mamaria. La congestin mamaria puede hacer que sienta las mamas pesadas, calientes y sensibles al tacto. El pico de la congestin mamaria ocurre en el plazo de los 3 a 5 das despus del parto. Las siguientes recomendaciones pueden ayudarla a aliviar la congestin mamaria:  Vace por completo las mamas al amamantar o extraer leche. Puede aplicar calor hmedo en las mamas (en la ducha o con toallas hmedas para manos) antes de amamantar o extraer leche. Esto aumenta la circulacin y ayuda a que la leche fluya. Si el beb no vaca por completo las mamas cuando lo amamanta, extraiga la leche restante despus de que haya finalizado.  Aplique compresas de hielo sobre las mamas inmediatamente despus de amamantar o extraer leche, a menos que le resulte demasiado incmodo. Haga lo siguiente: ? Ponga el hielo en una bolsa plstica. ? Coloque una toalla entre la piel y la bolsa de hielo. ? Coloque el hielo durante 20minutos, 2 o 3veces por da.  Asegrese de que el beb est prendido y se encuentre en la posicin correcta mientras lo alimenta.  Si la congestin mamaria persiste luego de 48 horas o despus de seguir estas recomendaciones, comunquese con su mdico o un asesor en lactancia. Recomendaciones de salud general durante la lactancia  Consuma 3 comidas y 3 colaciones saludables todos los das. Las madres bien alimentadas que amamantan necesitan entre 450 y 500 caloras adicionales por da. Puede cumplir con este requisito al aumentar la cantidad de una dieta  equilibrada que realice.  Beba suficiente agua para mantener la orina clara o de color amarillo plido.  Descanse con frecuencia, reljese y siga tomando sus vitaminas prenatales para prevenir la fatiga, el estrs y los niveles bajos de vitaminas y minerales en el cuerpo (deficiencias de nutrientes).  No consuma ningn producto que contenga nicotina o tabaco, como cigarrillos y cigarrillos electrnicos. El beb puede verse afectado por las sustancias qumicas de los cigarrillos que pasan a la leche materna y por la exposicin al humo ambiental del tabaco. Si necesita ayuda para dejar de fumar, consulte al mdico.  Evite el consumo de alcohol.  No consuma drogas ilegales o marihuana.  Antes de usar cualquier medicamento, hable con el mdico. Estos incluyen medicamentos recetados y de venta libre, como tambin vitaminas y suplementos a base de hierbas. Algunos medicamentos, que pueden ser perjudiciales para el beb, pueden pasar a travs de la leche materna.  Puede quedar embarazada durante la lactancia. Si se desea un mtodo anticonceptivo, consulte al mdico sobre cules son las opciones seguras durante la lactancia. Dnde encontrar ms informacin: Liga internacional [place] [employer]eche: [email or web address].org. Comunquese con un mdico si:  Siente que quiere dejar de amamantar o se siente frustrada con la lactancia.  Sus pezones estn agrietados o sangran.  Sus mamas estn irritadas, sensibles o calientes.  Tiene los siguientes sntomas: ? Dolor en las mamas o en los pezones. ? Un rea hinchada en cualquiera de las mamas. ? Fiebre o escalofros. ? Nuseas o vmitos. ? Drenaje de otro lquido distinto de la leche materna desde los pezones.  Sus mamas no se llenan antes de amamantar al beb para el quinto da despus del parto.  Se siente triste y deprimida.  El beb: ? Est demasiado somnoliento como para comer bien. ? Tiene problemas para dormir. ? Tiene ms de 1 semana de vida y moja menos de 6  paales en un periodo de 24   horas. ? No ha aumentado de peso a los 5 das de vida.  El beb defeca menos de 3 veces en 24 horas.  La piel del beb o las partes blancas de los ojos se vuelven amarillentas. Solicite ayuda de inmediato si:  El beb est muy cansado (letargo) y no se quiere despertar para comer.  Le sube la fiebre sin causa. Resumen  La lactancia materna ofrece muchos beneficios de salud para bebs y madres.  Intente amamantar a su beb cuando muestre signos tempranos de hambre.  Haga cosquillas o empuje suavemente los labios del beb con el dedo o el pezn para lograr que el beb abra la boca. Acerque el beb a la mama. Asegrese de que la mayor parte de la arola se encuentre dentro de la boca del beb. Ofrzcale una mama y haga eructar al beb antes de pasar a la otra.  Hable con su mdico o asesor en lactancia si tiene dudas o problemas con la lactancia. Esta informacin no tiene como fin reemplazar el consejo del mdico. Asegrese de hacerle al mdico cualquier pregunta que tenga. Document Released: 12/12/2005 Document Revised: 04/03/2017 Document Reviewed: 04/03/2017 Elsevier Interactive Patient Education  2018 [employer]   

## 2018-02-08 NOTE — Progress Notes (Signed)
Pt informed that the ultrasound is considered a limited OB ultrasound and is not intended to be a complete ultrasound exam.  Patient also informed that the ultrasound is not being completed with the intent of assessing for fetal or placental anomalies or any pelvic abnormalities.  Explained that the purpose of today's ultrasound is to assess for presentation, BPP and amniotic fluid volume.  Patient acknowledges the purpose of the exam and the limitations of the study.   Video interpreter Earlene PlaterDavis [phone] used for NST/BPP.

## 2018-02-08 NOTE — Progress Notes (Signed)
   PRENATAL VISIT NOTE Video Spanish interpreter used  Subjective:  Carolyn Mendez is a 42 y.o. [id number] at 3464w1d being seen today for ongoing prenatal care.  She is currently monitored for the following issues for this high-risk pregnancy and has OBESITY; Depression; Irritable bowel disease; Supervision of high risk pregnancy, antepartum; Diabetes mellitus complicating pregnancy, antepartum; Previous cesarean delivery, antepartum; AMA (advanced maternal age) multigravida 35+; and Unwanted fertility on their problem list.  Patient reports no complaints.  Contractions: Not present. Vag. Bleeding: None.  Movement: Present. Denies leaking of fluid.   The following portions of the patient's history were reviewed and updated as appropriate: allergies, current medications, past family history, past medical history, past social history, past surgical history and problem list. Problem list updated.  Objective:   Vitals:   02/08/18 1402  BP: (!) 104/59  Pulse: 85  Weight: 207 lb 8 oz (94.1 kg)    Fetal Status: Fetal Heart Rate (bpm): 128 Fundal Height: 36 cm Movement: Present  Presentation: Homero FellersFrank Breech  General:  Alert, oriented and cooperative. Patient is in no acute distress.  Skin: Skin is warm and dry. No rash noted.   Cardiovascular: Normal heart rate noted  Respiratory: Normal respiratory effort, no problems with respiration noted  Abdomen: Soft, gravid, appropriate for gestational age.  Pain/Pressure: Present     Pelvic: Cervical exam deferred        Extremities: Normal range of motion.  Edema: None  Mental Status:  Normal mood and affect. Normal behavior. Normal judgment and thought content.  FBS 85-107 (2 out of range) 2 hour pp 104-130 (3 of 21 out of range) [place] 1/31 vtx, AFI WNL, EFW 3 lb 13 oz (76%) NST:  Baseline: 125 bpm, Variability: Good {> 6 bpm), Accelerations: Reactive and Decelerations: Absent BPP 8/8  Assessment and Plan:  Pregnancy: [id number] at 2864w1d  1.  Supervision of high risk pregnancy, antepartum  2. Previous cesarean delivery, antepartum Desires TOLAC  3. Diabetes mellitus complicating pregnancy, antepartum New rx sent in CBGs are mostly in range - glyBURIDE (DIABETA) 2.5 MG tablet; Take 1.5 tablets (3.75 mg total) by mouth at bedtime.  Dispense: 90 tablet; Refill: 6 - US FETAL BPP W/NONSTRESS; Future  Preterm labor symptoms and general obstetric precautions including but not limited to vaginal bleeding, contractions, leaking of fluid and fetal movement were reviewed in detail with the patient. Please refer to After Visit Summary for other counseling recommendations.  Return in 8 days (on 02/16/2018) for as scheduled.   Reva Boresanya S Latha Staunton, MD

## 2018-02-09 ENCOUNTER — Other Ambulatory Visit: Payer: Self-pay

## 2018-02-12 ENCOUNTER — Encounter: Payer: Self-pay | Admitting: Obstetrics & Gynecology

## 2018-02-16 ENCOUNTER — Ambulatory Visit (INDEPENDENT_AMBULATORY_CARE_PROVIDER_SITE_OTHER): Payer: Self-pay | Admitting: Obstetrics & Gynecology

## 2018-02-16 ENCOUNTER — Ambulatory Visit (INDEPENDENT_AMBULATORY_CARE_PROVIDER_SITE_OTHER): Payer: Self-pay | Admitting: *Deleted

## 2018-02-16 ENCOUNTER — Ambulatory Visit: Payer: Self-pay

## 2018-02-16 ENCOUNTER — Encounter: Payer: Self-pay | Admitting: *Deleted

## 2018-02-16 VITALS — BP 105/56 | HR 91 | Wt 203.0 lb

## 2018-02-16 DIAGNOSIS — O0993 Supervision of high risk pregnancy, unspecified, third trimester: Secondary | ICD-10-CM

## 2018-02-16 DIAGNOSIS — E6609 Other obesity due to excess calories: Secondary | ICD-10-CM

## 2018-02-16 DIAGNOSIS — O09523 Supervision of elderly multigravida, third trimester: Secondary | ICD-10-CM

## 2018-02-16 DIAGNOSIS — O24913 Unspecified diabetes mellitus in pregnancy, third trimester: Secondary | ICD-10-CM

## 2018-02-16 DIAGNOSIS — Z6838 Body mass index (BMI) 38.0-38.9, adult: Secondary | ICD-10-CM

## 2018-02-16 DIAGNOSIS — O34219 Maternal care for unspecified type scar from previous cesarean delivery: Secondary | ICD-10-CM

## 2018-02-16 DIAGNOSIS — Z789 Other specified health status: Secondary | ICD-10-CM | POA: Insufficient documentation

## 2018-02-16 DIAGNOSIS — O24919 Unspecified diabetes mellitus in pregnancy, unspecified trimester: Secondary | ICD-10-CM

## 2018-02-16 DIAGNOSIS — O0990 Supervision of high risk pregnancy, unspecified, unspecified trimester: Secondary | ICD-10-CM

## 2018-02-16 IMAGING — US US FETAL BPP W/ NON-STRESS
1 series · 13 of 13 positions shown · non-contrast
Comparison: none

[Series 1: us fetal bpp w/nonstress · 13 acquisitions, 13 frames shown]
[im 1/13]
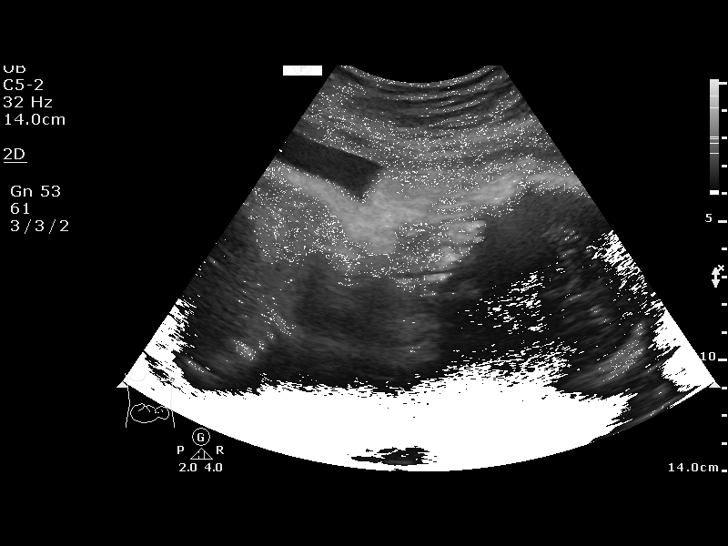
[im 2/13]
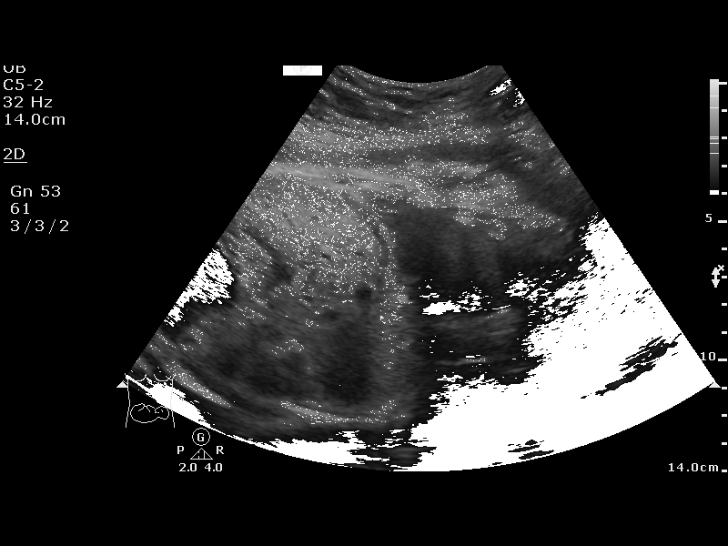
[im 3/13]
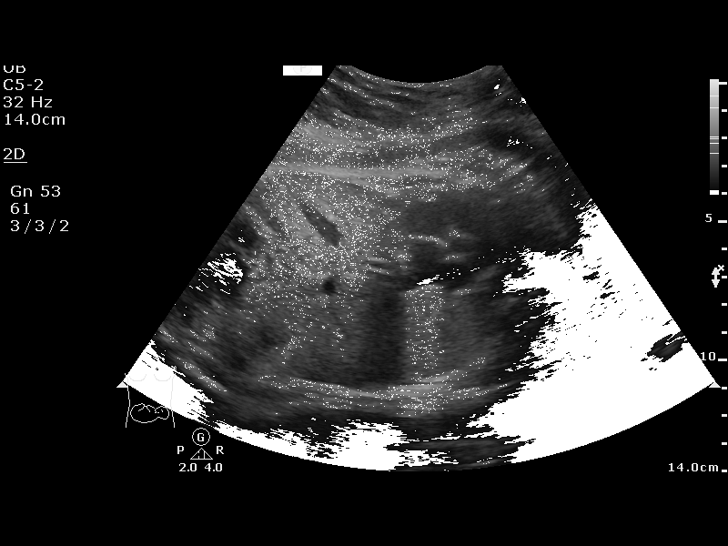
[im 4/13]
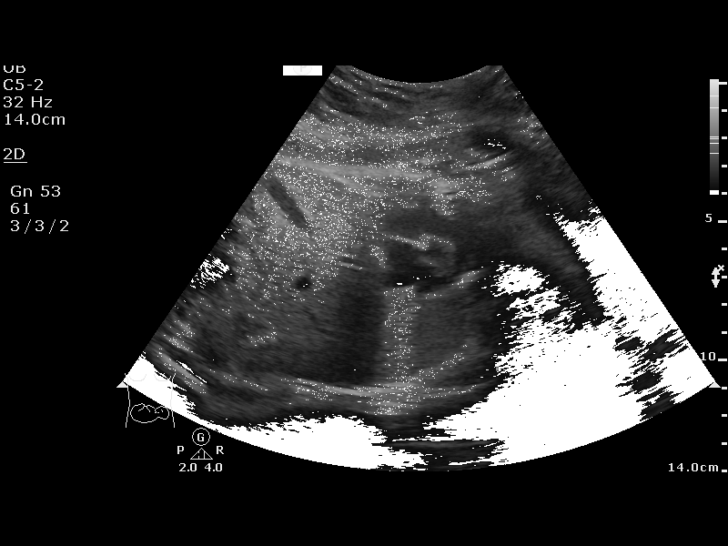
[im 5/13]
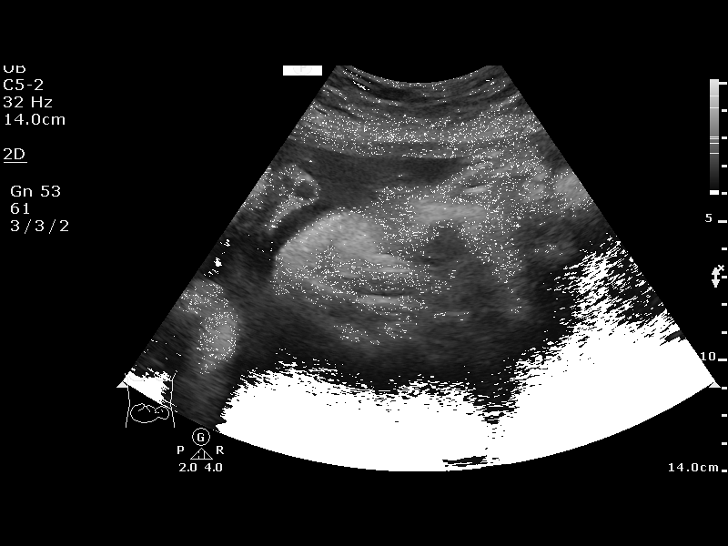
[im 6/13]
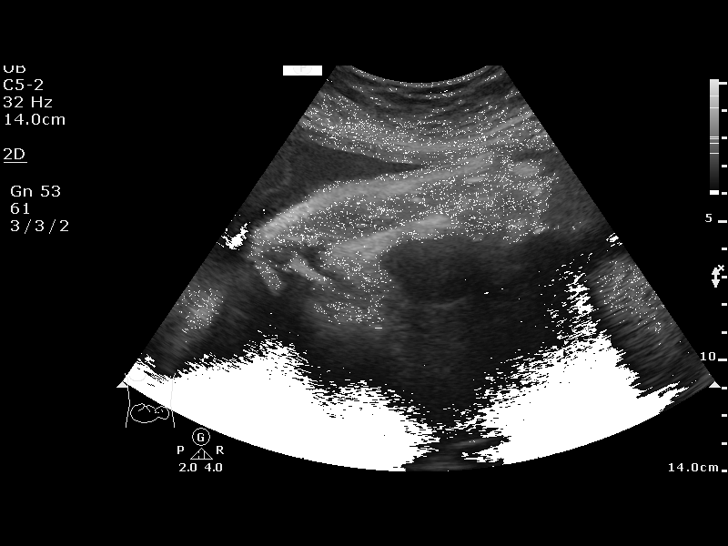
[im 7/13]
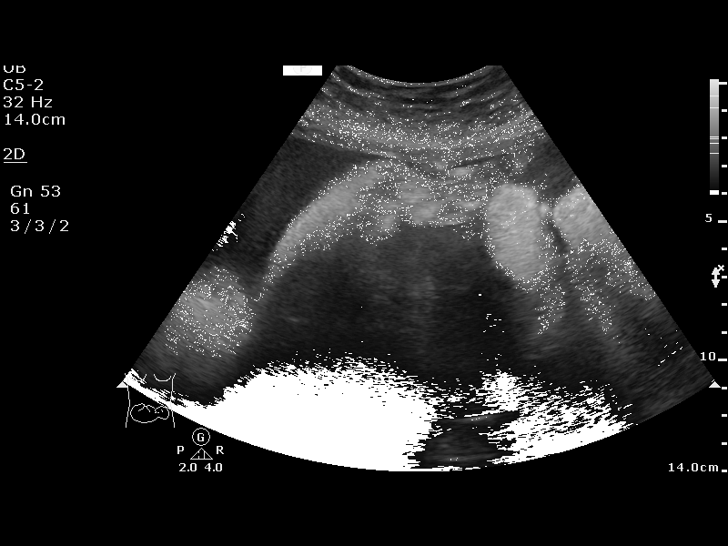
[im 8/13]
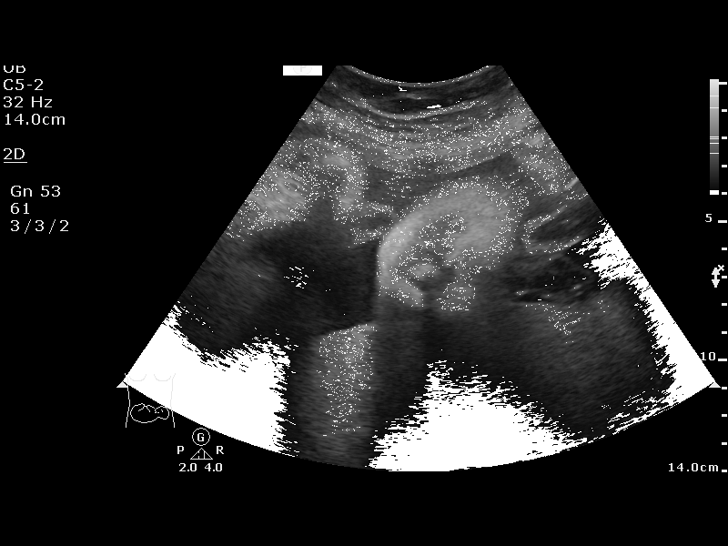
[im 9/13]
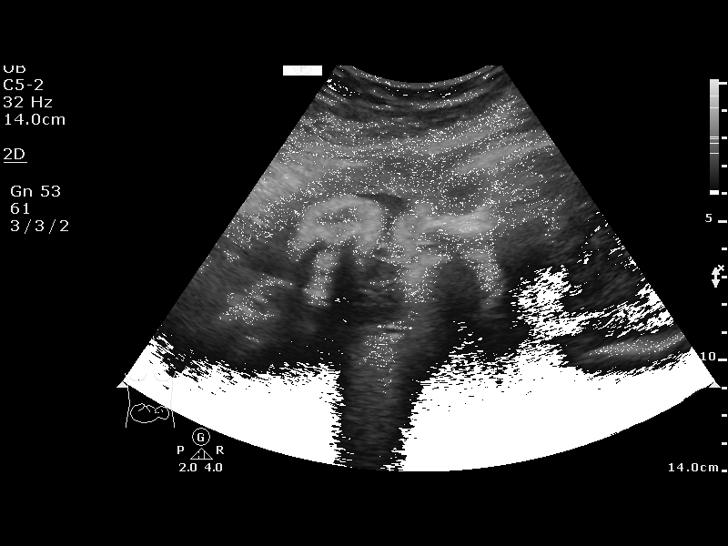
[im 10/13]
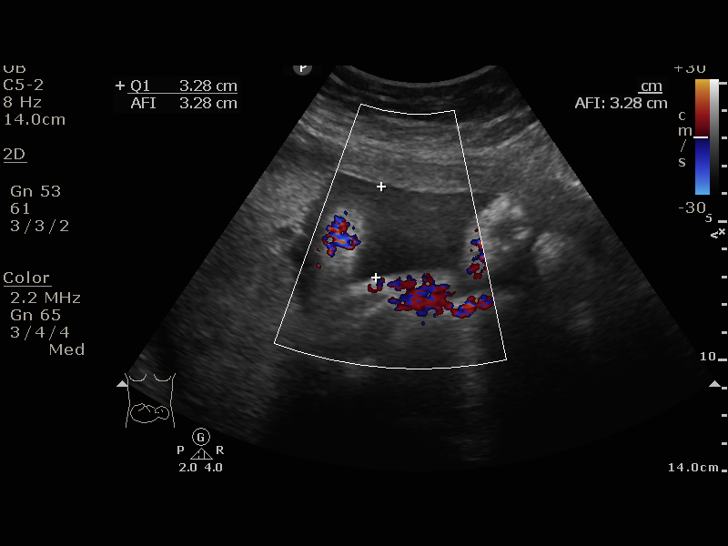
[im 11/13]
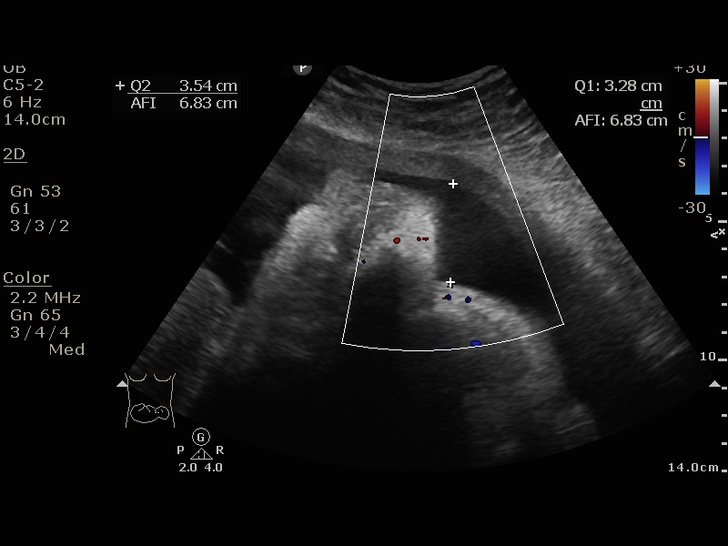
[im 12/13]
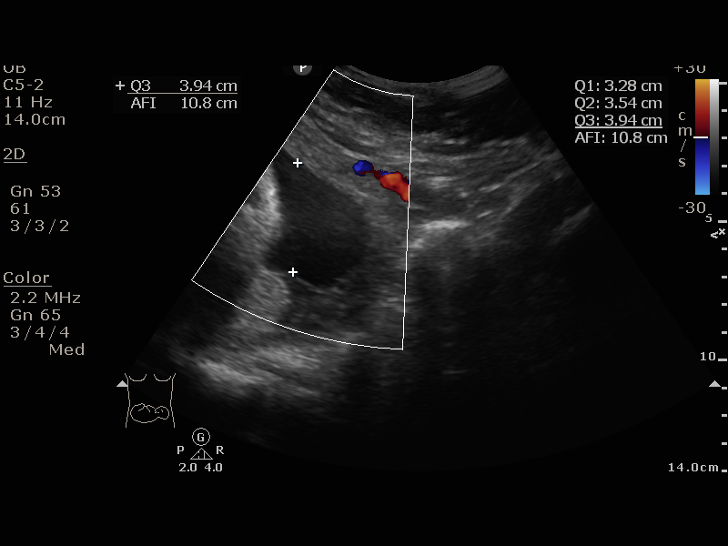
[im 13/13]
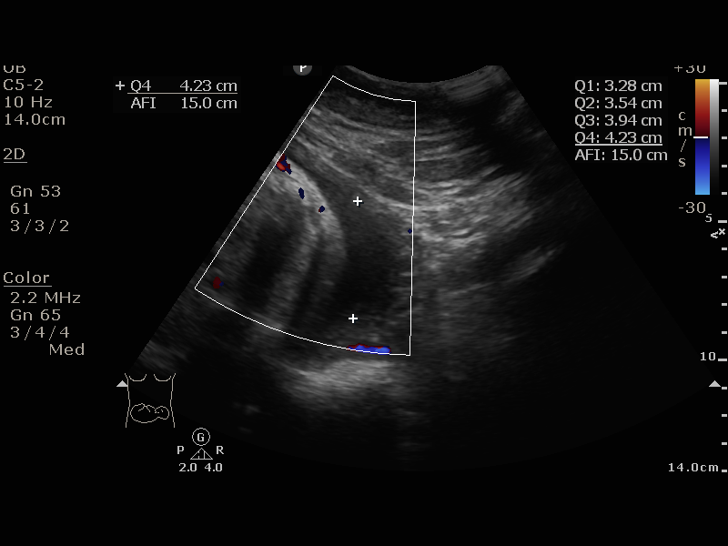

[13 of 13 positions shown; findings below may reference images not displayed]

[facility]
[REDACTED]

1  US FETAL BPP W/NONSTRESS                    76818.4

1  ELAVAZHAGAN                [PHONE_NUMBER]      [PHONE_NUMBER]     [PHONE_NUMBER]
Service(s) Provided

Indications

33 weeks gestation of pregnancy
Diabetes - Pregestational,3rd trimester        [PO]
OB History

Blood Type:            Height:  5'2"   Weight (lb):  202       BMI:
Gravidity:    6         Term:   4
Ectopic:      1        Living:  4
Fetal Evaluation

Num Of Fetuses:     1
Preg. Location:     Intrauterine
Cardiac Activity:   Observed
Presentation:       Transverse, head to maternal left

Amniotic Fluid
AFI FV:      Subjectively within normal limits

AFI Sum(cm)     %Tile       Largest Pocket(cm)
14.99           53

RUQ(cm)       RLQ(cm)       LUQ(cm)        LLQ(cm)
3.28
Biophysical Evaluation

Amniotic F.V:   Pocket => 2 cm two         F. Tone:        Observed
planes
F. Movement:    Observed                   N.S.T:          Reactive
F. Breathing:   Observed                   Score:          [DATE]
Gestational Age

LMP:           33w 1d        Date:  [DATE]                 EDD:   [DATE]
Best:          33w 1d     Det. By:  LMP  ([DATE])          EDD:   [DATE]
Impression

IUP at  [PO]
Normal amniotic fluid volume
BPP[DATE]
Recommendations

Continue antenatal testing.
Recommend delivery by 39 weeks if maternal and fetal status
remain reassuring.

## 2018-02-16 NOTE — Progress Notes (Signed)
   PRENATAL VISIT NOTE  Subjective:  Carolyn Mendez is a 42 y.o. [id number] at 3374w1d being seen today for ongoing prenatal care.  She is currently monitored for the following issues for this high-risk pregnancy and has OBESITY; Depression; Irritable bowel disease; Supervision of high risk pregnancy, antepartum; Diabetes mellitus complicating pregnancy, antepartum; Previous cesarean delivery, antepartum; AMA (advanced maternal age) multigravida 35+; and Unwanted fertility on their problem list.  Patient reports no complaints.   .  .   . Denies leaking of fluid.  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 He is concerned because she is labeled "high risk" due to her GDM.  The following portions of the patient's history were reviewed and updated as appropriate: allergies, current medications, past family history, past medical history, past social history, past surgical history and problem list. Problem list updated.  Interpretor present for encounter.  Objective:  There were no vitals filed for this visit.  Fetal Status:           General:  Alert, oriented and cooperative. Patient is in no acute distress.  Skin: Skin is warm and dry. No rash noted.   Cardiovascular: Normal heart rate noted  Respiratory: Normal respiratory effort, no problems with respiration noted  Abdomen: Soft, gravid, appropriate for gestational age.        Pelvic: Cervical exam deferred        Extremities: Normal range of motion.     Mental Status:  Normal mood and affect. Normal behavior. Normal judgment and thought content.   Her recorded sugars are basically all normal.   Assessment and Plan:  Pregnancy: [id number] at 7674w1d  1. Supervision of high risk pregnancy, antepartum   2. Previous cesarean delivery, antepartum - she has had 2 previous VBACs, wants TOLAC  3. Class 2 obesity due to excess calories without serious comorbidity with body mass index (BMI) of 38.0 to 38.9 in  adult   4. Diabetes mellitus complicating pregnancy, antepartum - stop baby asa at 8436 week - continue weekly BPP - MFM u/s followup on 02-12-18 - She will get a work saying that she can work - Continue glyburide 1 1/2 pills at bedtime  5. Elderly multigravida in third trimester   Preterm labor symptoms and general obstetric precautions including but not limited to vaginal bleeding, contractions, leaking of fluid and fetal movement were reviewed in detail with the patient. Please refer to After Visit Summary for other counseling recommendations.  No Follow-up on file.   Allie BossierMyra C Areebah Meinders, MD

## 2018-02-16 NOTE — Progress Notes (Signed)

## 2018-02-21 ENCOUNTER — Ambulatory Visit (INDEPENDENT_AMBULATORY_CARE_PROVIDER_SITE_OTHER): Payer: Self-pay | Admitting: Obstetrics and Gynecology

## 2018-02-21 ENCOUNTER — Ambulatory Visit: Payer: Self-pay

## 2018-02-21 ENCOUNTER — Ambulatory Visit (INDEPENDENT_AMBULATORY_CARE_PROVIDER_SITE_OTHER): Payer: Self-pay | Admitting: *Deleted

## 2018-02-21 VITALS — BP 106/55 | HR 88 | Wt 201.7 lb

## 2018-02-21 DIAGNOSIS — O0990 Supervision of high risk pregnancy, unspecified, unspecified trimester: Secondary | ICD-10-CM

## 2018-02-21 DIAGNOSIS — Z98891 History of uterine scar from previous surgery: Secondary | ICD-10-CM

## 2018-02-21 DIAGNOSIS — Z789 Other specified health status: Secondary | ICD-10-CM

## 2018-02-21 DIAGNOSIS — O09523 Supervision of elderly multigravida, third trimester: Secondary | ICD-10-CM

## 2018-02-21 DIAGNOSIS — O24919 Unspecified diabetes mellitus in pregnancy, unspecified trimester: Secondary | ICD-10-CM

## 2018-02-21 IMAGING — US US FETAL BPP W/ NON-STRESS
1 series · 13 of 13 positions shown · non-contrast
Comparison: none

[Series 1: us fetal bpp w/nonstress · 13 acquisitions, 13 frames shown]
[im 1/13]
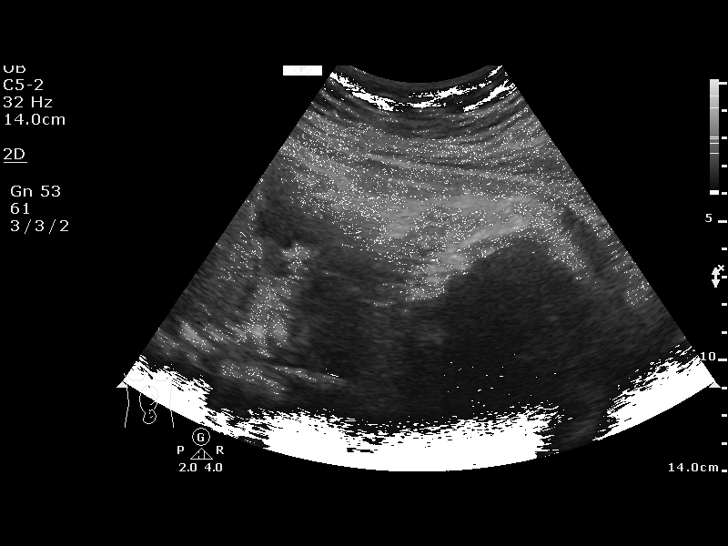
[im 2/13]
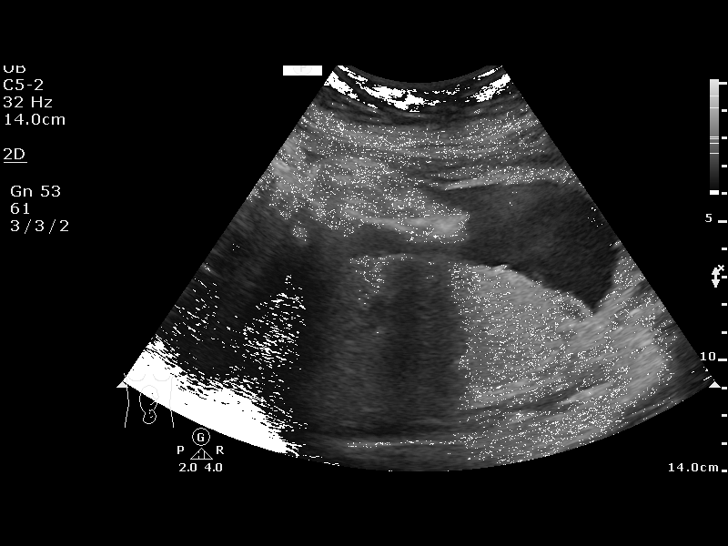
[im 3/13]
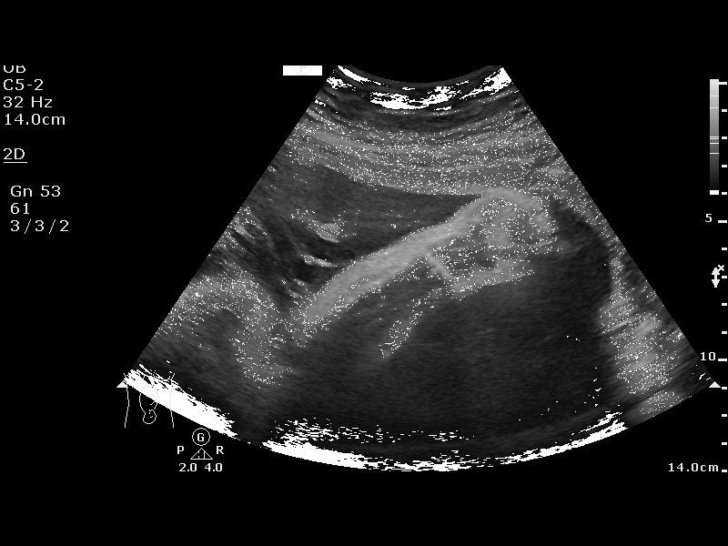
[im 4/13]
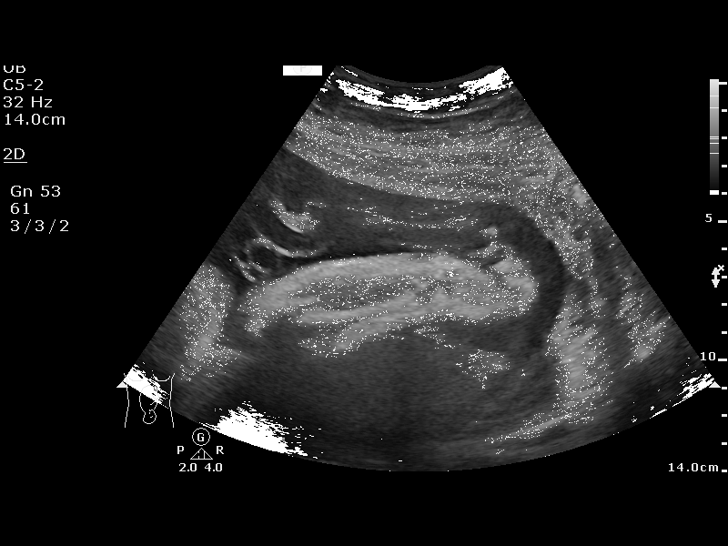
[im 5/13]
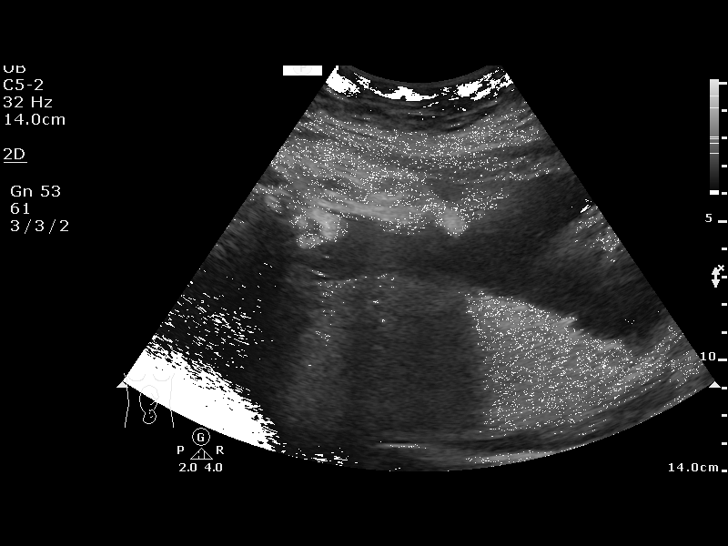
[im 6/13]
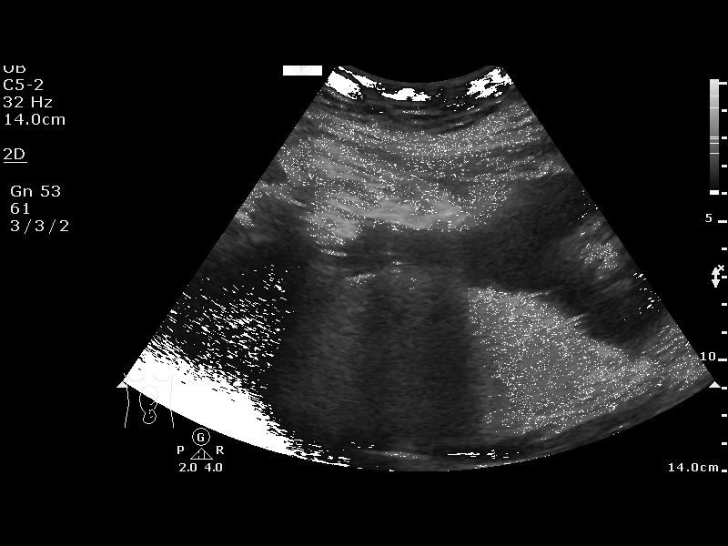
[im 7/13]
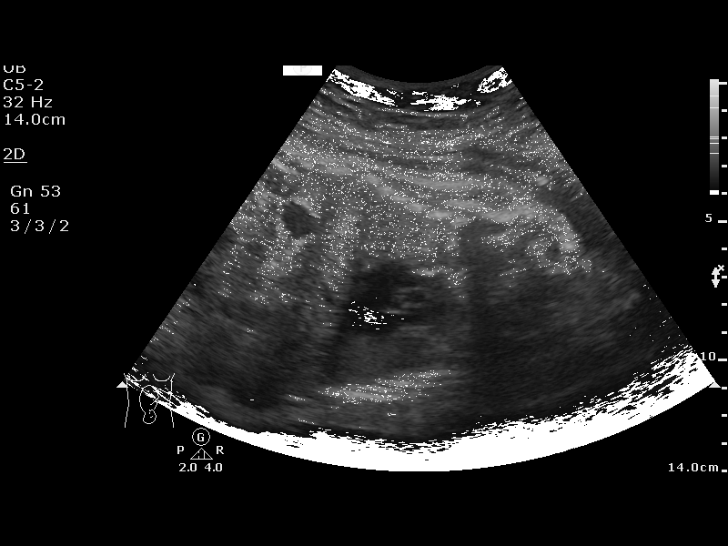
[im 8/13]
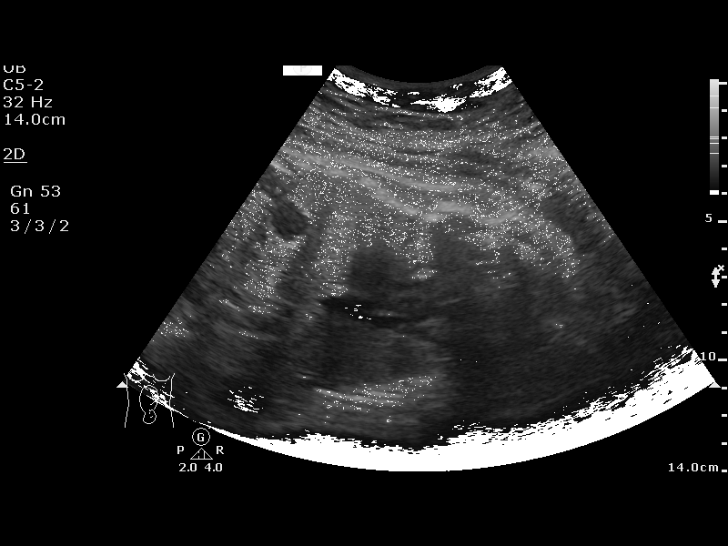
[im 9/13]
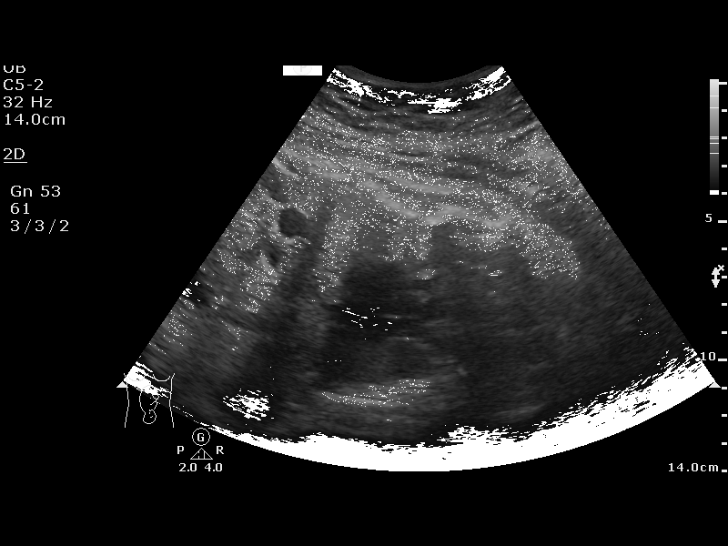
[im 10/13]
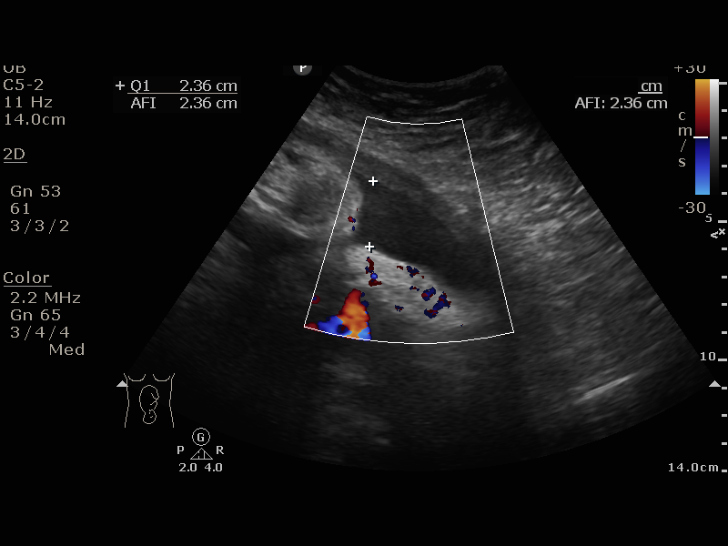
[im 11/13]
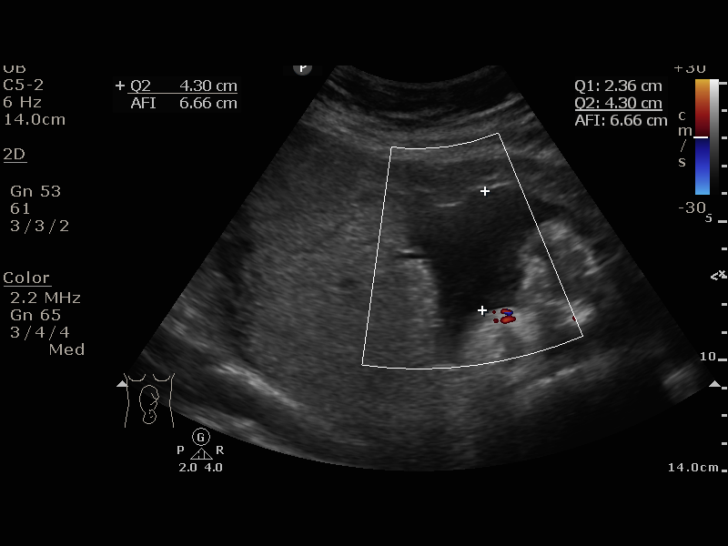
[im 12/13]
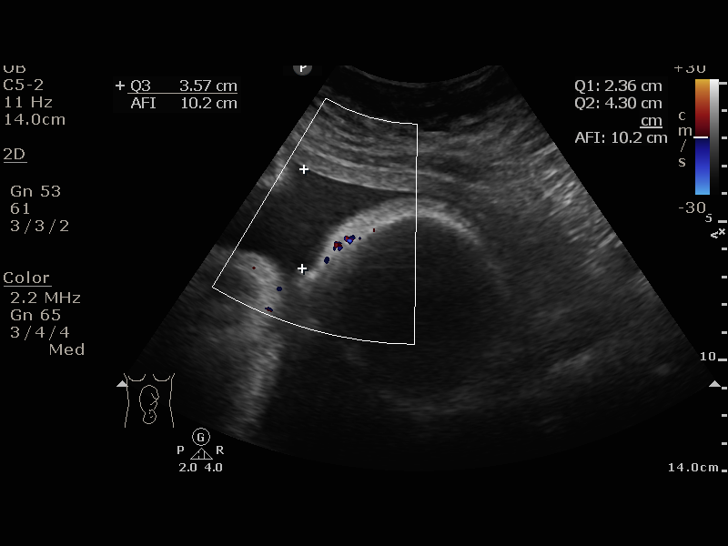
[im 13/13]
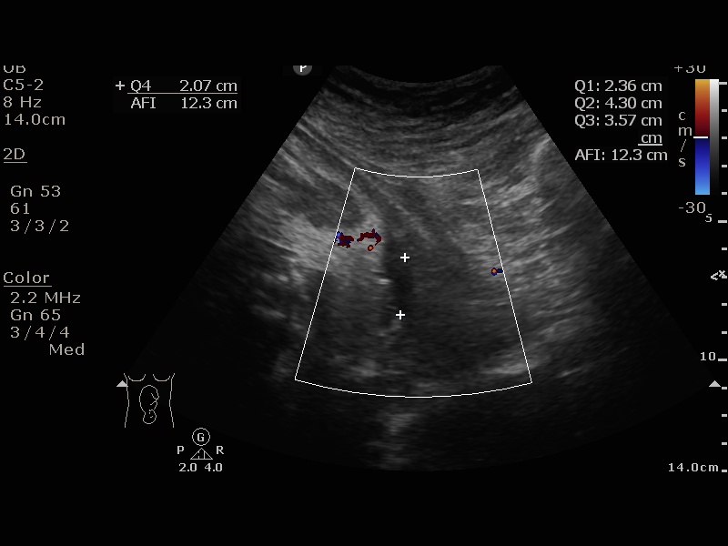

[13 of 13 positions shown; findings below may reference images not displayed]

[facility]
[REDACTED]

1  US FETAL BPP W/NONSTRESS                    76818.4

1  ABAD ABAD          [PHONE_NUMBER]      [PHONE_NUMBER]     [PHONE_NUMBER]
Service(s) Provided

Indications

33 weeks gestation of pregnancy
Pre-existing diabetes, type 2, in pregnancy,   [8J]
third trimester
OB History

Blood Type:            Height:  5'2"   Weight (lb):  202       BMI:
Gravidity:    6         Term:   4
Ectopic:      1        Living:  4
Fetal Evaluation

Num Of Fetuses:     1
Preg. Location:     Intrauterine
Cardiac Activity:   Observed
Presentation:       Cephalic

Amniotic Fluid
AFI FV:      Subjectively within normal limits

AFI Sum(cm)     %Tile       Largest Pocket(cm)
12.3            36

RUQ(cm)       RLQ(cm)       LUQ(cm)        LLQ(cm)
2.36
Biophysical Evaluation

Amniotic F.V:   Pocket => 2 cm two         F. Tone:        Observed
planes
F. Movement:    Observed                   N.S.T:          Reactive
F. Breathing:   Observed                   Score:          [DATE]
Gestational Age

LMP:           33w 6d        Date:  [DATE]                 EDD:   [DATE]
Best:          33w 6d     Det. By:  LMP  ([DATE])          EDD:   [DATE]
Impression

IUP at  [8J]
Normal amniotic fluid volume
BPP[DATE]
Recommendations

Continue antenatal testing.
Recommend delivery by 39 weeks if maternal and fetal status
remain reassuring.

## 2018-02-21 NOTE — Progress Notes (Signed)
Prenatal Visit Note Date: 02/21/2018 [facility]  Subjective:  Carolyn EarlsYanet Mendez is a 42 y.o. [id number] at 2916w6d being seen today for ongoing prenatal care.  She is currently monitored for the following issues for this high-risk pregnancy and has OBESITY; Depression; Irritable bowel disease; Supervision of high risk pregnancy, antepartum; Diabetes mellitus complicating pregnancy, antepartum; Previous cesarean delivery, antepartum; AMA (advanced maternal age) multigravida 35+; Unwanted fertility; and Language barrier on their problem list.  Patient reports no complaints.   Contractions: Irregular. Vag. Bleeding: None.  Movement: Present. Denies leaking of fluid.   The following portions of the patient's history were reviewed and updated as appropriate: allergies, current medications, past family history, past medical history, past social history, past surgical history and problem list. Problem list updated.  Objective:   Vitals:   02/21/18 1436  BP: (!) 106/55  Pulse: 88  Weight: 201 lb 11.2 oz (91.5 kg)    Fetal Status: Fetal Heart Rate (bpm): NST   Movement: Present     General:  Alert, oriented and cooperative. Patient is in no acute distress.  Skin: Skin is warm and dry. No rash noted.   Cardiovascular: Normal heart rate noted  Respiratory: Normal respiratory effort, no problems with respiration noted  Abdomen: Soft, gravid, appropriate for gestational age. Pain/Pressure: Present     Pelvic:  Cervical exam deferred        Extremities: Normal range of motion.     Mental Status: Normal mood and affect. Normal behavior. Normal judgment and thought content.   Urinalysis:      Assessment and Plan:  Pregnancy: [id number] at 7616w6d  1. Supervision of high risk pregnancy, antepartum Routine care. D/w pt re: bc nv  2. Diabetes mellitus complicating pregnancy, antepartum Doing well on glyburide 1.5 qhs. Borderline BS values (close to abnormal). Likely will  have to increase at some point. Continue at current dose for now. BPP 10/10. Has repeat growth tomorrow. Continue with weekly testing  3. Previous cesarean delivery, antepartum H/o VBAC x 2. Consent already signed  4. Elderly multigravida in third trimester   5. Language barrier Interpreter used.   Preterm labor symptoms and general obstetric precautions including but not limited to vaginal bleeding, contractions, leaking of fluid and fetal movement were reviewed in detail with the patient. Please refer to After Visit Summary for other counseling recommendations.  Return in about 1 week (around 02/28/2018) for as scheduled. [place].   Geordan Xu, MD

## 2018-02-21 NOTE — Progress Notes (Signed)
Interpreter Viviana SimplerAlis Herrera present for encounter.  Pt reports increased blood sugars x3 days - however she has been @ the hospital with her daughter and not able to follow her diet very well. Pt states her daughter attempted suicide. Pt was offered appt w/[facility] today and declined. She has appt scheduled w/[facility] for counseling.  US for growth scheduled  tomorrow.

## 2018-02-21 NOTE — Progress Notes (Signed)

## 2018-02-22 ENCOUNTER — Encounter (HOSPITAL_COMMUNITY): Payer: Self-pay

## 2018-02-22 ENCOUNTER — Other Ambulatory Visit (HOSPITAL_COMMUNITY): Payer: Self-pay | Admitting: Maternal and Fetal Medicine

## 2018-02-22 ENCOUNTER — Inpatient Hospital Stay (HOSPITAL_COMMUNITY)
Admission: AD | Admit: 2018-02-22 | Discharge: 2018-02-22 | Disposition: A | Discharge status: Home / Self Care | Payer: Self-pay | Source: Ambulatory Visit | Attending: Obstetrics and Gynecology | Admitting: Obstetrics and Gynecology

## 2018-02-22 ENCOUNTER — Other Ambulatory Visit: Payer: Self-pay

## 2018-02-22 ENCOUNTER — Ambulatory Visit (HOSPITAL_COMMUNITY)
Admission: RE | Admit: 2018-02-22 | Discharge: 2018-02-22 | Disposition: A | Discharge status: Home / Self Care | Payer: Self-pay | Source: Ambulatory Visit | Attending: Obstetrics & Gynecology | Admitting: Obstetrics & Gynecology

## 2018-02-22 DIAGNOSIS — Z3A34 34 weeks gestation of pregnancy: Secondary | ICD-10-CM

## 2018-02-22 DIAGNOSIS — Z7984 Long term (current) use of oral hypoglycemic drugs: Secondary | ICD-10-CM

## 2018-02-22 DIAGNOSIS — O24113 Pre-existing diabetes mellitus, type 2, in pregnancy, third trimester: Secondary | ICD-10-CM | POA: Insufficient documentation

## 2018-02-22 DIAGNOSIS — O358XX0 Maternal care for other (suspected) fetal abnormality and damage, not applicable or unspecified: Secondary | ICD-10-CM

## 2018-02-22 DIAGNOSIS — Z3009 Encounter for other general counseling and advice on contraception: Secondary | ICD-10-CM

## 2018-02-22 DIAGNOSIS — O359XX0 Maternal care for (suspected) fetal abnormality and damage, unspecified, not applicable or unspecified: Secondary | ICD-10-CM

## 2018-02-22 DIAGNOSIS — O09523 Supervision of elderly multigravida, third trimester: Secondary | ICD-10-CM | POA: Insufficient documentation

## 2018-02-22 DIAGNOSIS — O24119 Pre-existing diabetes mellitus, type 2, in pregnancy, unspecified trimester: Secondary | ICD-10-CM

## 2018-02-22 DIAGNOSIS — O24419 Gestational diabetes mellitus in pregnancy, unspecified control: Secondary | ICD-10-CM

## 2018-02-22 DIAGNOSIS — E119 Type 2 diabetes mellitus without complications: Secondary | ICD-10-CM | POA: Insufficient documentation

## 2018-02-22 DIAGNOSIS — O24919 Unspecified diabetes mellitus in pregnancy, unspecified trimester: Secondary | ICD-10-CM

## 2018-02-22 DIAGNOSIS — Z3689 Encounter for other specified antenatal screening: Secondary | ICD-10-CM

## 2018-02-22 IMAGING — US US MFM OB FOLLOW-UP
1 series · 13 of 28 positions shown · non-contrast
Comparison: none

[Series 1: us mfm ob follow-up · 56 acquisitions, 13 frames shown]
[im 3/56]
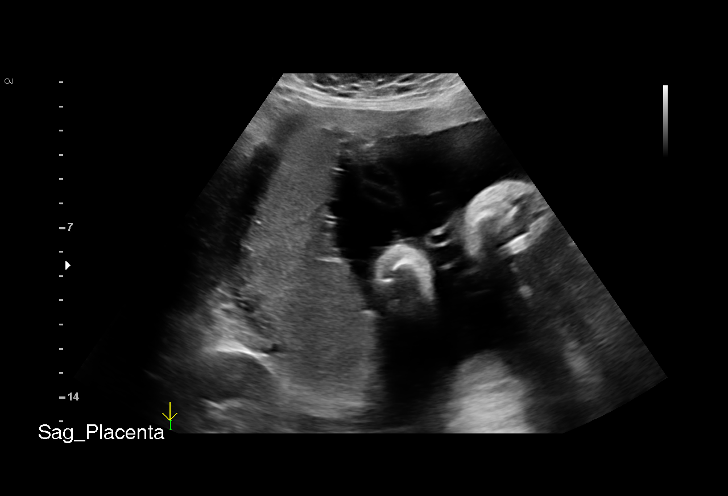
[im 7/56]
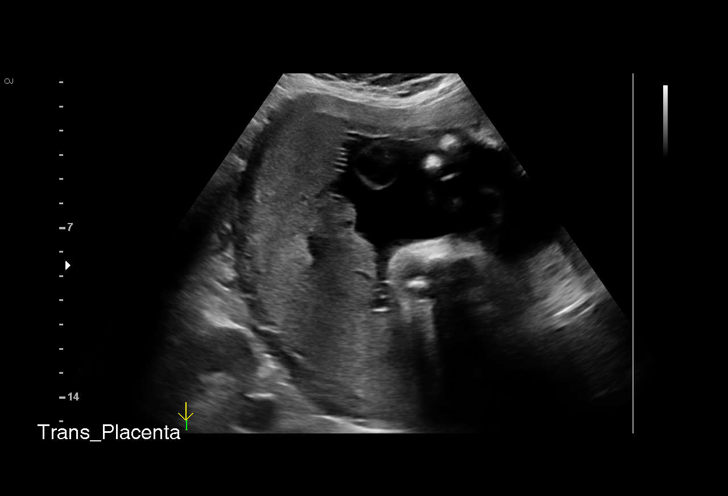
[im 11/56]
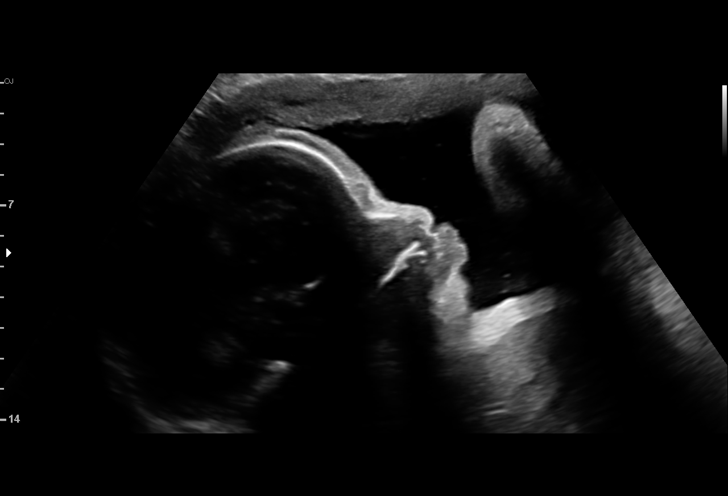
[im 15/56]
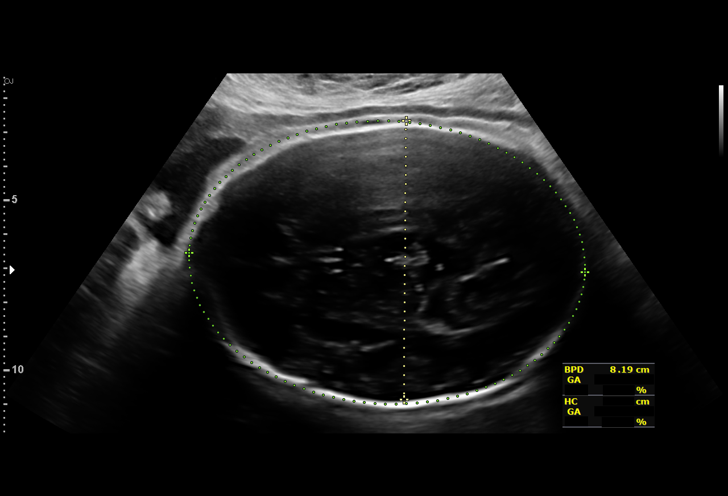
[im 19/56]
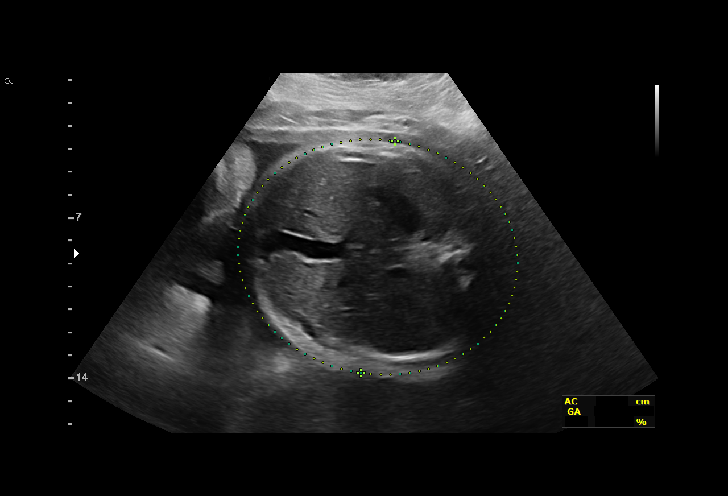
[im 23/56]
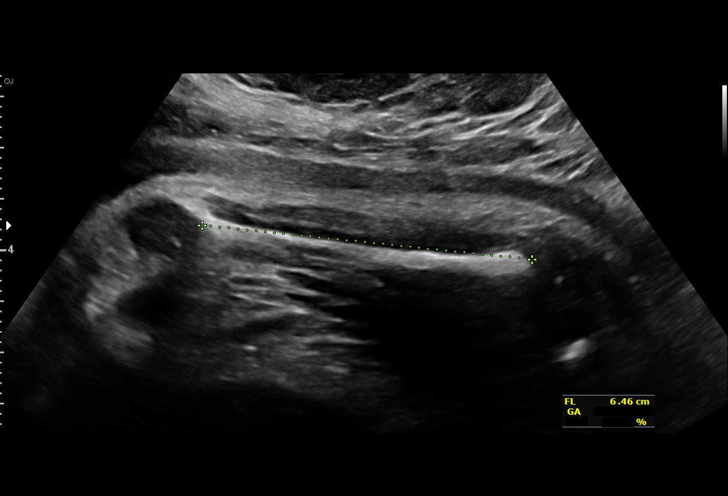
[im 29/56]
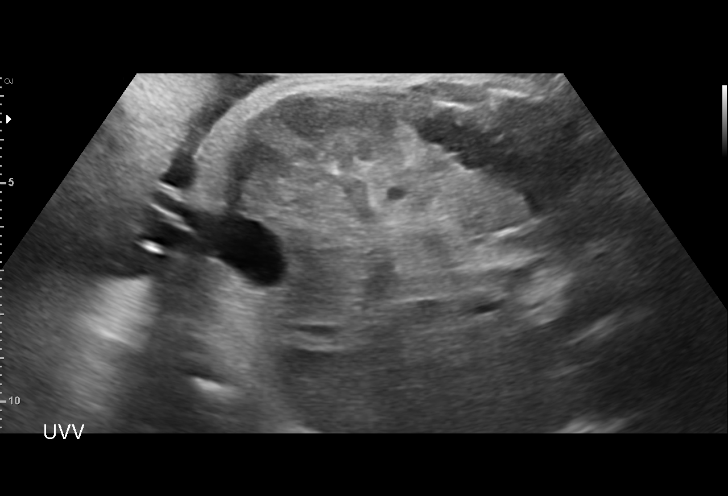
[im 33/56]
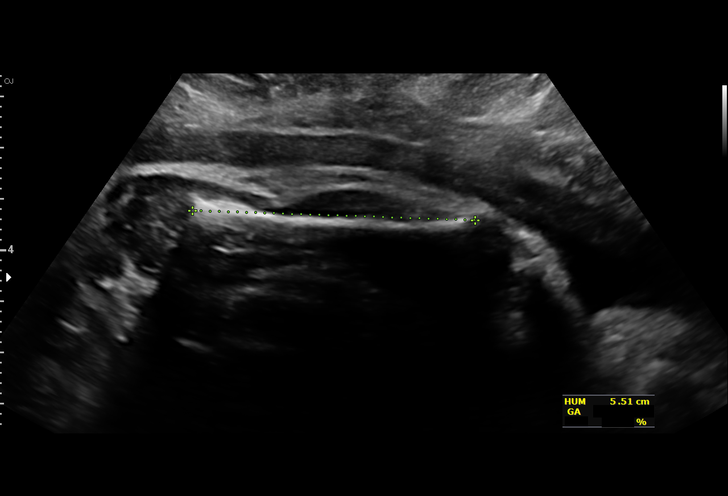
[im 37/56]
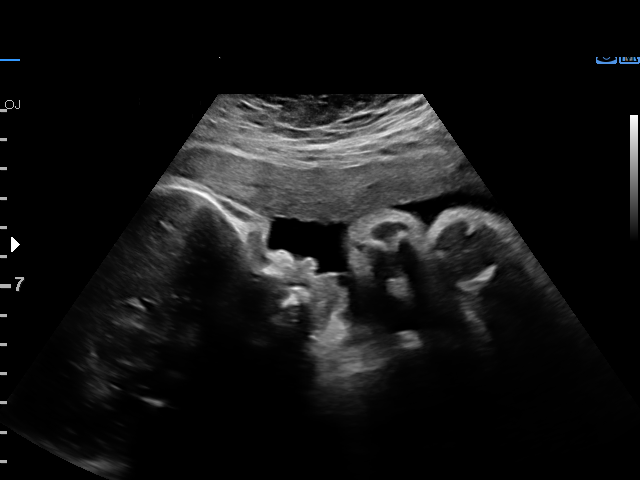
[im 41/56]
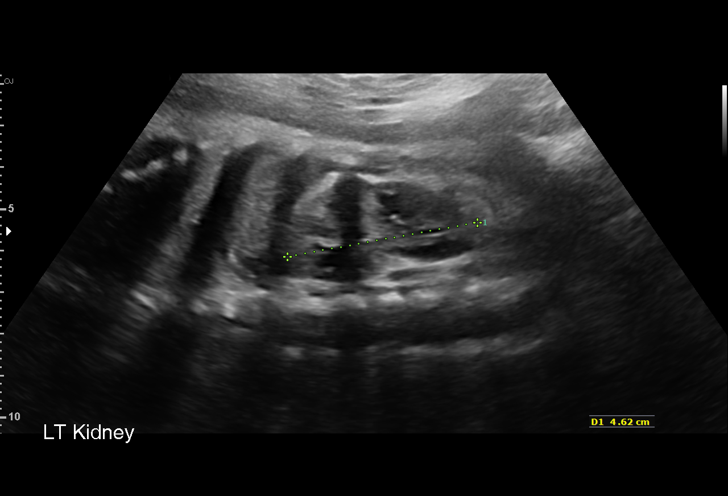
[im 45/56]
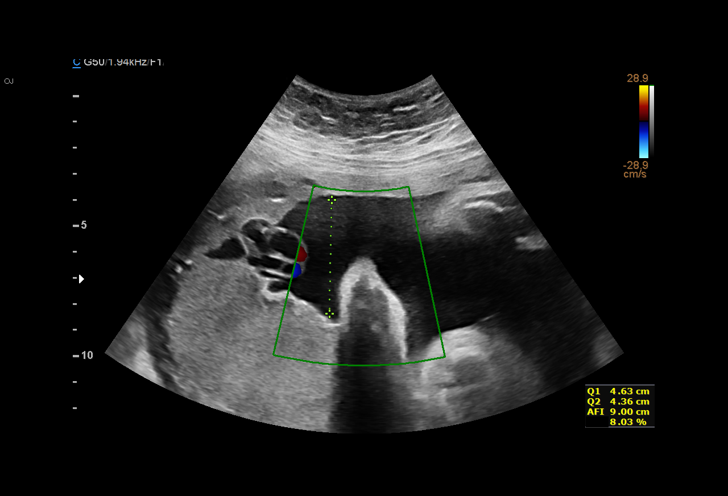
[im 49/56]
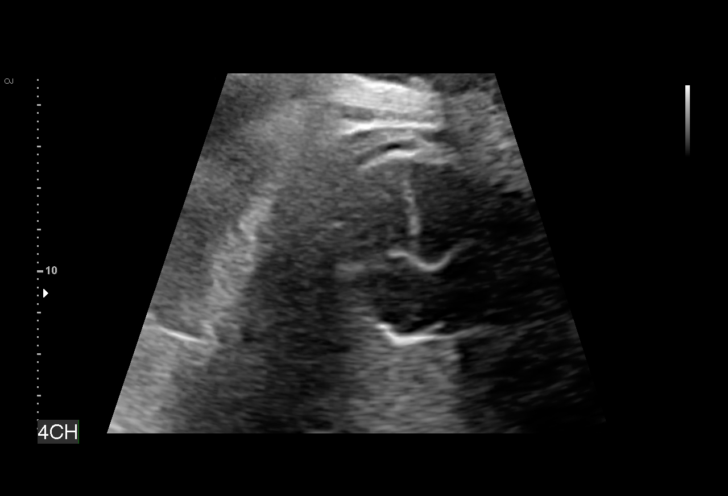
[im 53/56]
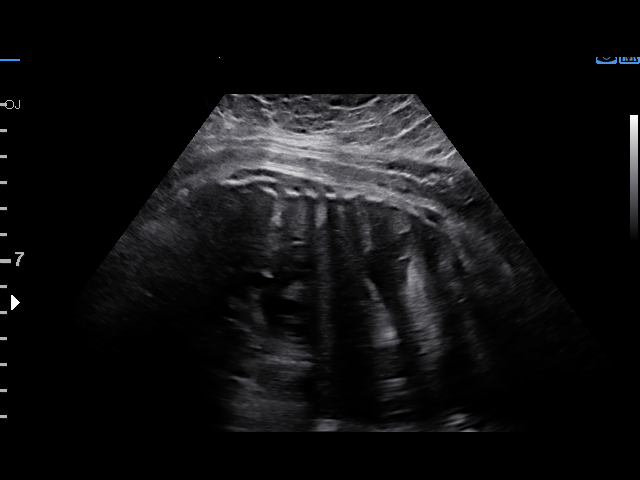

[13 of 28 positions shown; findings below may reference images not displayed]

1  [facility]            [PHONE_NUMBER]      [PHONE_NUMBER]     [PHONE_NUMBER]
2  NE[facility]            [PHONE_NUMBER]      [PHONE_NUMBER]     [PHONE_NUMBER]
Indications

34 weeks gestation of pregnancy
Pre-existing diabetes, type 2, in pregnancy,   [UX]
third trimester-glyburide
Advanced maternal age multigravida (40),       [UX]
third trimester
OB History

Blood Type:            Height:  5'2"   Weight (lb):  202       BMI:
Gravidity:    6         Term:   4
Ectopic:      1        Living:  4
Fetal Evaluation

Num Of Fetuses:     1
Fetal Heart         129
Rate(bpm):
Cardiac Activity:   Observed
Presentation:       Breech
Placenta:           Posterior, above cervical os
P. Cord Insertion:  Previously Visualized

Amniotic Fluid
AFI FV:      Subjectively within normal limits

AFI Sum(cm)     %Tile       Largest Pocket(cm)
11.85           33

RUQ(cm)                     LUQ(cm)        LLQ(cm)
4.63
Biophysical Evaluation

Amniotic F.V:   Within normal limits       F. Tone:        Observed
F. Movement:    Observed                   Score:          [DATE]
F. Breathing:   Not Observed
Biometry

BPD:      82.5  mm     G. Age:  33w 1d         24  %    CI:         69.5   %    70 - 86
FL/HC:      20.5   %    19.4 -
HC:      315.9  mm     G. Age:  35w 3d         51  %    HC/AC:      0.92        0.96 -
AC:      341.9  mm     G. Age:  38w 1d       > 97  %    FL/BPD:     78.7   %    71 - 87
FL:       64.9  mm     G. Age:  33w 3d         26  %    FL/AC:      19.0   %    20 - 24
HUM:        54  mm     G. Age:  31w 3d          5  %

Est. FW:    [UX]  gm      6 lb 5 oz     89  %
Gestational Age

LMP:           34w 0d        Date:  [DATE]                 EDD:   [DATE]
U/S Today:     35w 0d                                        EDD:   [DATE]
Best:          34w 0d     Det. By:  LMP  ([DATE])          EDD:   [DATE]
Anatomy

Cranium:               Appears normal         Aortic Arch:            Previously seen
Cavum:                 Appears normal         Ductal Arch:            Previously seen
Ventricles:            Appears normal         Diaphragm:              Appears normal
Choroid Plexus:        Previously seen        Stomach:                Appears normal, left
sided
Cerebellum:            Previously seen        Abdomen:                Umbilical vein
varix
Posterior Fossa:       Previously seen        Abdominal Wall:         Previously seen
Nuchal Fold:           Previously seen        Cord Vessels:           Previously seen
Face:                  Orbits and profile     Kidneys:                Appear normal
previously seen
Lips:                  Appears normal         Bladder:                Appears normal
Thoracic:              Appears normal         Spine:                  Previously seen
Heart:                 Appears normal         Upper Extremities:      Previously seen
(4CH, axis, and situs
RVOT:                  Previously seen        Lower Extremities:      Previously seen
LVOT:                  Previously seen

Other:  Fetus appears to be a female prev seen. Heels and RT 5th digit prev
visualized. Nasal bone prev visualized. Technically difficult due to
maternal habitus and fetal position.
Cervix Uterus Adnexa

Cervix
Not visualized (advanced GA >[UX])

Uterus
No abnormality visualized.

Left Ovary
Not visualized.

Right Ovary
Not visualized.
Cul De Sac:   No free fluid seen.
Adnexa:       No abnormality visualized.
Impression

Single living intrauterine pregnancy at 34w 0d.
Breech presentation.
Placenta Posterior, above cervical os.
Normal amniotic fluid volume.
Composite fetal growth in the 89%ile. AC>97%ile.
A new umbilical vein varix is visualized measuring 11.6cm.
No filling defect is visualized on color doppler.
Otherwise normal interval fetal anatomy.
BPP [DATE] (nonsustained fetal breathing).
Recommendations

Findings of today's ultrasound discussed with patient via
Spanish interpreter.
Sent to [facility] for NST.
Recommend twice weekly UVV surveillance.
Delivery no later than 37 weeks if fetal status reassuring.

## 2018-02-22 NOTE — Discharge Instructions (Signed)

## 2018-02-22 NOTE — MAU Provider Note (Signed)
History     CSN: [id number]  Arrival date and time: 02/22/18 1716  None      Chief Complaint  Patient presents with  . Non-stress Test   HPI Carolyn Mendez is a 42 y.o. [id number] at [redacted]w[redacted]d who presents from MFM for NST. 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 Had BPP in MFM today of 6/8, off for breathing, with normal AFI. Nursing staff gone for the day so unable to perform NST in MFM.  Patient denies complaints & reports positive fetal movement.   Spanish interpreter at bedside.   OB History    Gravida Para Term Preterm AB Living   6 4 4  0 1 4   SAB TAB Ectopic Multiple Live Births       1 0 4      Past Medical History:  Diagnosis Date  . Diabetes mellitus without complication (HCC)   . GERD (gastroesophageal reflux disease)     Past Surgical History:  Procedure Laterality Date  . CESAREAN SECTION     VBAC x2    Family History  Problem Relation Age of Onset  . Hyperlipidemia Mother   . Kidney disease Mother   . Hyperlipidemia Sister   . Kidney disease Sister   . Osteoporosis Sister     Social History   Tobacco Use  . Smoking status: Never Smoker  . Smokeless tobacco: Never Used  Substance Use Topics  . Alcohol use: No  . Drug use: No    Allergies: No Known Allergies  Medications Prior to Admission  Medication Sig Dispense Refill Last Dose  . aspirin EC 81 MG tablet Take 1 tablet (81 mg total) daily by mouth. 100 tablet 2 Taking  . glyBURIDE (DIABETA) 2.5 MG tablet Take 1.5 tablets (3.75 mg total) by mouth at bedtime. 90 tablet 6 Taking  . Prenatal Vit-Fe Fumarate-FA (PREPLUS) 27-1 MG TABS Take 1 tablet daily by mouth.   Taking  . ranitidine (ZANTAC) 150 MG capsule Take 1 capsule (150 mg total) by mouth 2 (two) times daily. (Patient not taking: Reported on 02/16/2018) 60 capsule 3 Not Taking    Review of Systems  Constitutional: Negative.   Gastrointestinal: Negative.   Genitourinary: Negative.    Physical Exam   Blood pressure 109/63,  pulse 82, temperature 98.8 F (37.1 C), temperature source Oral, resp. rate 18, height 5\' 3"  (1.6 m), weight 203 lb (92.1 kg), last menstrual period 06/29/2017, SpO2 96 %.  Physical Exam  Nursing note and vitals reviewed. Constitutional: She is oriented to person, place, and time. She appears well-developed and well-nourished. No distress.  HENT:  Head: Normocephalic and atraumatic.  Eyes: Conjunctivae are normal. Right eye exhibits no discharge. Left eye exhibits no discharge. No scleral icterus.  Neck: Normal range of motion.  Respiratory: Effort normal. No respiratory distress.  Neurological: She is alert and oriented to person, place, and time.  Skin: She is not diaphoretic.  Psychiatric: She has a normal mood and affect. Her behavior is normal. Judgment and thought content normal.    MAU Course  Procedures No results found for this or any previous visit (from the past 24 hour(s)). [place] Mfm Fetal Bpp Wo Non Stress  Result Date: 02/22/2018 ----------------------------------------------------------------------  OBSTETRICS REPORT                      (Signed Final 02/22/2018 05:18 pm) ---------------------------------------------------------------------- Patient Info  ID #:       [id number]  D.O.B.:  06/27/1976 (41 yrs)  Name:       Carolyn Mendez               Visit Date: 02/22/2018 04:48 pm ---------------------------------------------------------------------- Performed By  Performed By:     Tommi Emery         Ref. Address:     [address]                                                             [place], [place]                                                             [address]  Attending:        Darlyn Read MD         Location:         [facility]  Referred By:      Adam Phenix                    MD  ---------------------------------------------------------------------- Orders   #  Description                                 Code   1  [place] MFM OB FOLLOW UP                         [phone]   2  [place] MFM FETAL BPP WO NON STRESS              76819.01  ----------------------------------------------------------------------   #  Ordered By               Order #        Accession #    Episode #   1  Particia Nearing            [id number]      [id number]     [id number]   2  MARTHA DECKER            [id number]      [id number]     [id number]  ---------------------------------------------------------------------- Indications   [redacted] weeks gestation of pregnancy                Z3A.34   Pre-existing diabetes, type 2, in pregnancy,   O24.113   third trimester-glyburide   Advanced maternal age multigravida (33),       O09.523   third trimester  ---------------------------------------------------------------------- OB History  Blood Type:  Height:  5'2"   Weight (lb):  202       BMI:  36.94  Gravidity:    6         Term:   4  Ectopic:      1        Living:  4 ---------------------------------------------------------------------- Fetal Evaluation  Num Of Fetuses:     1  Fetal Heart         129  Rate(bpm):  Cardiac Activity:   Observed  Presentation:       Breech  Placenta:           Posterior, above cervical os  P. Cord Insertion:  Previously Visualized  Amniotic Fluid  AFI FV:      Subjectively within normal limits  AFI Sum(cm)     %Tile       Largest Pocket(cm)  11.85           33          4.63  RUQ(cm)                     LUQ(cm)        LLQ(cm)  4.63                        4.36           2.86 ---------------------------------------------------------------------- Biophysical Evaluation  Amniotic F.V:   Within normal limits       F. Tone:        Observed  F. Movement:    Observed                   Score:          6/8  F. Breathing:   Not Observed ---------------------------------------------------------------------- Biometry  BPD:       82.5  mm     G. Age:  33w 1d         24  %    CI:         69.5   %    70 - 86                                                          FL/HC:      20.5   %    19.4 - 21.8  HC:      315.9  mm     G. Age:  35w 3d         51  %    HC/AC:      0.92        0.96 - 1.11  AC:      341.9  mm     G. Age:  38w 1d       > 97  %    FL/BPD:     78.7   %    71 - 87  FL:       64.9  mm     G. Age:  33w 3d         26  %    FL/AC:      19.0   %    20 - 24  HUM:        54  mm  G. Age:  31w 3d          5  %  Est. FW:    2859  gm      6 lb 5 oz     89  % ---------------------------------------------------------------------- Gestational Age  LMP:           34w 0d        Date:  06/29/17                 EDD:   04/05/18  U/S Today:     35w 0d                                        EDD:   03/29/18  Best:          34w 0d     Det. By:  LMP  (06/29/17)          EDD:   04/05/18 ---------------------------------------------------------------------- Anatomy  Cranium:               Appears normal         Aortic Arch:            Previously seen  Cavum:                 Appears normal         Ductal Arch:            Previously seen  Ventricles:            Appears normal         Diaphragm:              Appears normal  Choroid Plexus:        Previously seen        Stomach:                Appears normal, left                                                                        sided  Cerebellum:            Previously seen        Abdomen:                Umbilical vein                                                                        varix  Posterior Fossa:       Previously seen        Abdominal Wall:         Previously seen  Nuchal Fold:           Previously seen        Cord Vessels:           Previously seen  Face:  Orbits and profile     Kidneys:                Appear normal                         previously seen  Lips:                  Appears normal         Bladder:                Appears normal  Thoracic:               Appears normal         Spine:                  Previously seen  Heart:                 Appears normal         Upper Extremities:      Previously seen                         (4CH, axis, and situs  RVOT:                  Previously seen        Lower Extremities:      Previously seen  LVOT:                  Previously seen  Other:  Fetus appears to be a female prev seen. Heels and RT 5th digit prev          visualized. Nasal bone prev visualized. Technically difficult due to          maternal habitus and fetal position. ---------------------------------------------------------------------- Cervix Uterus Adnexa  Cervix  Not visualized (advanced GA >29wks)  Uterus  No abnormality visualized.  Left Ovary  Not visualized.  Right Ovary  Not visualized.  Cul De Sac:   No free fluid seen.  Adnexa:       No abnormality visualized. ---------------------------------------------------------------------- Impression  Single living intrauterine pregnancy at 34w 0d.  Breech presentation.  Placenta Posterior, above cervical os.  Normal amniotic fluid volume.  Composite fetal growth in the 89%ile. AC>97%ile.  A new umbilical vein varix is visualized measuring 11.6cm.  No filling defect is visualized on color doppler.  Otherwise normal interval fetal anatomy.  BPP 6/8 (nonsustained fetal breathing). ---------------------------------------------------------------------- Recommendations  Findings of today's ultrasound discussed with patient via  Spanish interpreter.  Sent to MAU for NST.  Recommend twice weekly UVV surveillance.  Delivery no later than 37 weeks if fetal status reassuring. ----------------------------------------------------------------------                   Darlyn Read, MD Electronically Signed Final Report   02/22/2018 05:18 pm ----------------------------------------------------------------------  [place] Mfm Ob Follow Up  Result Date: 02/22/2018 ----------------------------------------------------------------------   OBSTETRICS REPORT                      (Signed Final 02/22/2018 05:18 pm) ---------------------------------------------------------------------- Patient Info  ID #:       [id number]                          D.O.B.:  May 08, 1976 (41 yrs)  Name:       Carolyn Mendez  Visit Date: 02/22/2018 04:48 pm ---------------------------------------------------------------------- Performed By  Performed By:     Tommi Emerylivia Johnson         Ref. Address:     [address]                    [address]                                                             [address]                                                             [place], [place]                                                             [id number]  Attending:        Darlyn ReadEmily Bunce MD         [facility]  Referred By:      Adam PhenixJAMES G ARNOLD                    MD ---------------------------------------------------------------------- Orders   #  Description                                 Code   1  US MFM OB FOLLOW UP                         [id number]   2  US MFM FETAL BPP WO NON STRESS              76819.01  ----------------------------------------------------------------------   #  Ordered By               Order #        Accession #    Episode #   1  Particia NearingMARTHA DECKER            [id number]      [id number]     [id number]   2  MARTHA DECKER            [id number]      [id number]     [id number]  ---------------------------------------------------------------------- Indications   [redacted] weeks gestation of pregnancy                Z3A.34   Pre-existing diabetes, type 2, in pregnancy,   O24.113   third trimester-glyburide   Advanced maternal age multigravida (8140),       O09.523   third trimester  ---------------------------------------------------------------------- OB History  Blood Type:            Height:  5'2"   Weight (lb):  202       BMI:  36.94  Gravidity:    6  Term:   4  Ectopic:      1        Living:  4  ---------------------------------------------------------------------- Fetal Evaluation  Num Of Fetuses:     1  Fetal Heart         129  Rate(bpm):  Cardiac Activity:   Observed  Presentation:       Breech  Placenta:           Posterior, above cervical os  P. Cord Insertion:  Previously Visualized  Amniotic Fluid  AFI FV:      Subjectively within normal limits  AFI Sum(cm)     %Tile       Largest Pocket(cm)  11.85           33          4.63  RUQ(cm)                     LUQ(cm)        LLQ(cm)  4.63                        4.36           2.86 ---------------------------------------------------------------------- Biophysical Evaluation  Amniotic F.V:   Within normal limits       F. Tone:        Observed  F. Movement:    Observed                   Score:          6/8  F. Breathing:   Not Observed ---------------------------------------------------------------------- Biometry  BPD:      82.5  mm     G. Age:  33w 1d         24  %    CI:         69.5   %    70 - 86                                                          FL/HC:      20.5   %    19.4 - 21.8  HC:      315.9  mm     G. Age:  35w 3d         51  %    HC/AC:      0.92        0.96 - 1.11  AC:      341.9  mm     G. Age:  38w 1d       > 97  %    FL/BPD:     78.7   %    71 - 87  FL:       64.9  mm     G. Age:  33w 3d         26  %    FL/AC:      19.0   %    20 - 24  HUM:        54  mm     G. Age:  31w 3d          5  %  Est. FW:    2859  gm  6 lb 5 oz     89  % ---------------------------------------------------------------------- Gestational Age  LMP:           34w 0d        Date:  06/29/17                 EDD:   04/05/18  U/S Today:     35w 0d                                        EDD:   03/29/18  Best:          34w 0d     Det. By:  LMP  (06/29/17)          EDD:   04/05/18 ---------------------------------------------------------------------- Anatomy  Cranium:               Appears normal         Aortic Arch:            Previously seen  Cavum:                  Appears normal         Ductal Arch:            Previously seen  Ventricles:            Appears normal         Diaphragm:              Appears normal  Choroid Plexus:        Previously seen        Stomach:                Appears normal, left                                                                        sided  Cerebellum:            Previously seen        Abdomen:                Umbilical vein                                                                        varix  Posterior Fossa:       Previously seen        Abdominal Wall:         Previously seen  Nuchal Fold:           Previously seen        Cord Vessels:           Previously seen  Face:                  Orbits and profile     Kidneys:  Appear normal                         previously seen  Lips:                  Appears normal         Bladder:                Appears normal  Thoracic:              Appears normal         Spine:                  Previously seen  Heart:                 Appears normal         Upper Extremities:      Previously seen                         (4CH, axis, and situs  RVOT:                  Previously seen        Lower Extremities:      Previously seen  LVOT:                  Previously seen  Other:  Fetus appears to be a female prev seen. Heels and RT 5th digit prev          visualized. Nasal bone prev visualized. Technically difficult due to          maternal habitus and fetal position. ---------------------------------------------------------------------- Cervix Uterus Adnexa  Cervix  Not visualized (advanced GA >29wks)  Uterus  No abnormality visualized.  Left Ovary  Not visualized.  Right Ovary  Not visualized.  Cul De Sac:   No free fluid seen.  Adnexa:       No abnormality visualized. ---------------------------------------------------------------------- Impression  Single living intrauterine pregnancy at 34w 0d.  Breech presentation.  Placenta Posterior, above cervical os.  Normal amniotic fluid volume.   Composite fetal growth in the 89%ile. AC>97%ile.  A new umbilical vein varix is visualized measuring 11.6cm.  No filling defect is visualized on color doppler.  Otherwise normal interval fetal anatomy.  BPP 6/8 (nonsustained fetal breathing). ---------------------------------------------------------------------- Recommendations  Findings of today's ultrasound discussed with patient via  Spanish interpreter.  Sent to MAU for NST.  Recommend twice weekly UVV surveillance.  Delivery no later than 37 weeks if fetal status reassuring. ----------------------------------------------------------------------                   Darlyn Read, MD Electronically Signed Final Report   02/22/2018 05:18 pm ----------------------------------------------------------------------   MDM NST:  Baseline: 125 bpm, Variability: Good {> 6 bpm), Accelerations: Reactive and Decelerations: Absent Reactive NST Per Dr. Archie Patten, pt to be discharge home after reactive NST MFM scheduler will call patient tomorrow for appropriate follow up  Assessment and Plan  A: 1. NST (non-stress test) reactive   2. [redacted] weeks gestation of pregnancy   3. Fetal abnormality affecting management of mother, antepartum condition or complication, not applicable or unspecified fetus   4. Diabetes mellitus complicating pregnancy, antepartum    P: Discharge home Discussed reasons to return to MAU Keep scheduled f/u with MFM & OB  Judeth Horn 02/22/2018, 5:51 PM

## 2018-02-22 NOTE — Progress Notes (Addendum)
G6P4 @ [redacted] wksga. [facility] not done yet so here to get it done. Denies LOF or bleeding. + FM. EFM applied.   C/s for breech and had to successful VBAC.   NST reactive. D/c orders received.   1810: Pt d/c with instructions with interpreter at bs.

## 2018-02-26 ENCOUNTER — Other Ambulatory Visit (HOSPITAL_COMMUNITY): Payer: Self-pay | Admitting: Maternal & Fetal Medicine

## 2018-02-26 ENCOUNTER — Encounter (HOSPITAL_COMMUNITY): Payer: Self-pay

## 2018-02-26 ENCOUNTER — Other Ambulatory Visit (HOSPITAL_COMMUNITY): Payer: Self-pay | Admitting: *Deleted

## 2018-02-26 ENCOUNTER — Ambulatory Visit (HOSPITAL_COMMUNITY)
Admission: RE | Admit: 2018-02-26 | Discharge: 2018-02-26 | Disposition: A | Discharge status: Home / Self Care | Payer: Self-pay | Source: Ambulatory Visit | Attending: Obstetrics & Gynecology | Admitting: Obstetrics & Gynecology

## 2018-02-26 DIAGNOSIS — O09523 Supervision of elderly multigravida, third trimester: Secondary | ICD-10-CM | POA: Insufficient documentation

## 2018-02-26 DIAGNOSIS — O24113 Pre-existing diabetes mellitus, type 2, in pregnancy, third trimester: Secondary | ICD-10-CM

## 2018-02-26 DIAGNOSIS — Z3009 Encounter for other general counseling and advice on contraception: Secondary | ICD-10-CM

## 2018-02-26 DIAGNOSIS — IMO0001 Reserved for inherently not codable concepts without codable children: Secondary | ICD-10-CM

## 2018-02-26 DIAGNOSIS — O358XX0 Maternal care for other (suspected) fetal abnormality and damage, not applicable or unspecified: Secondary | ICD-10-CM

## 2018-02-26 DIAGNOSIS — Z3A34 34 weeks gestation of pregnancy: Secondary | ICD-10-CM | POA: Insufficient documentation

## 2018-02-26 IMAGING — US US MFM FETAL BPP W/O NON-STRESS
1 series · 12 of 22 positions shown · non-contrast
Comparison: none

[Series 1: us mfm fetal bpp w/o non-stress · 22 acquisitions, 12 frames shown]
[im 1/22]
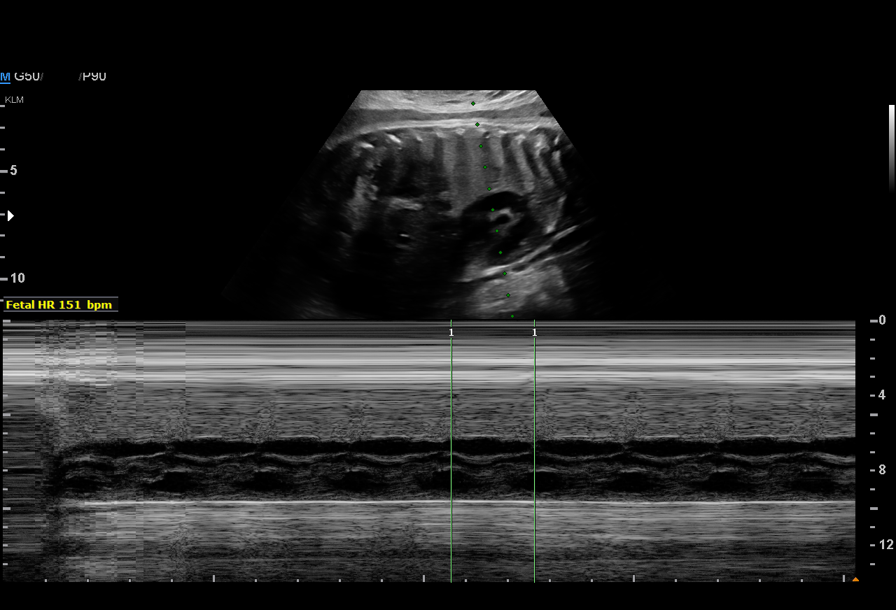
[im 3/22]
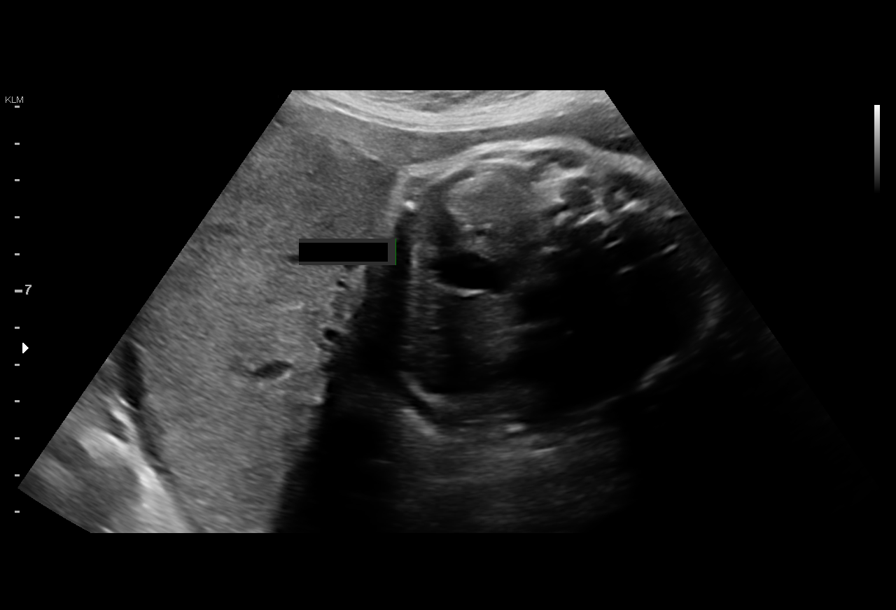
[im 5/22]
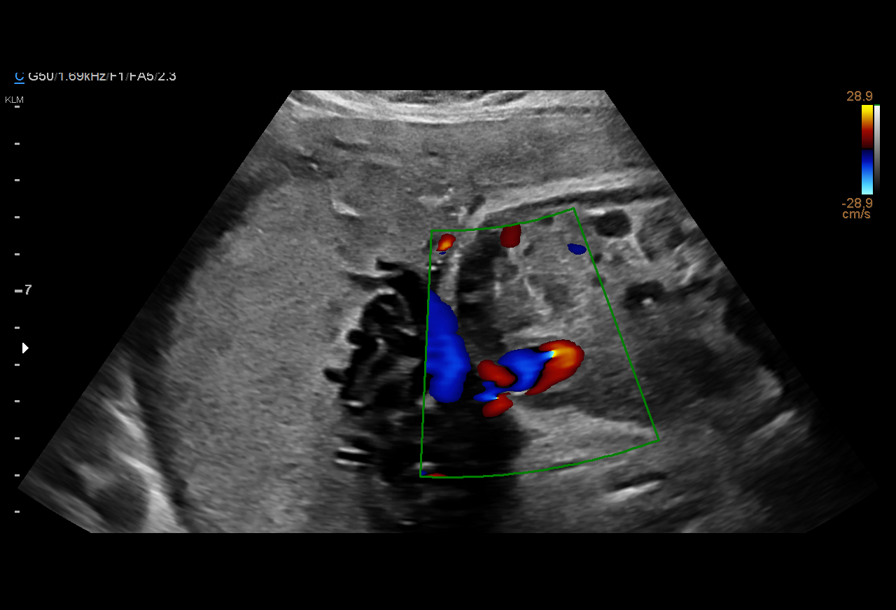
[im 7/22]
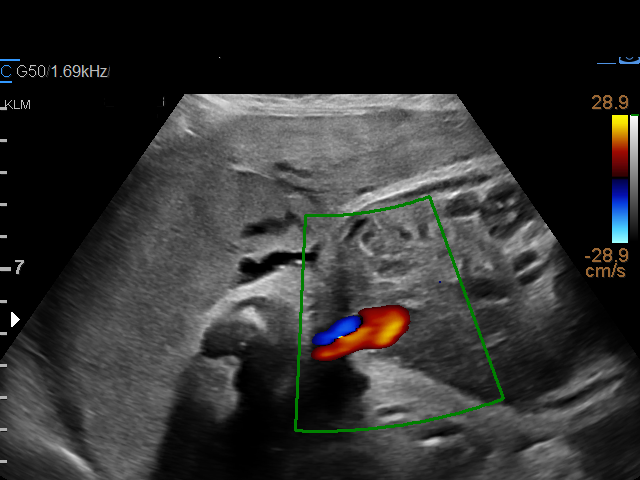
[im 9/22]
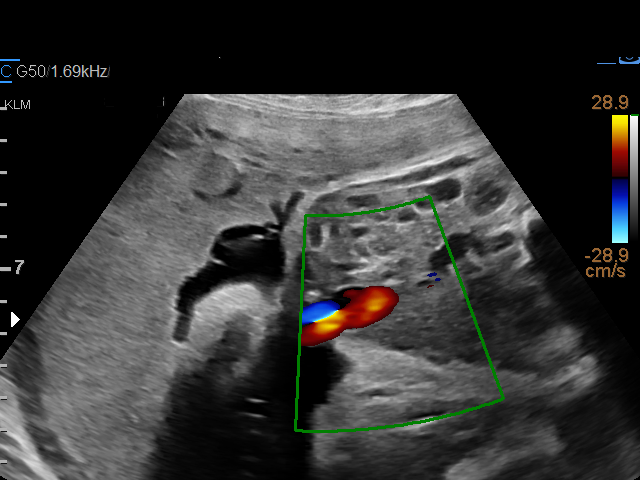
[im 11/22]
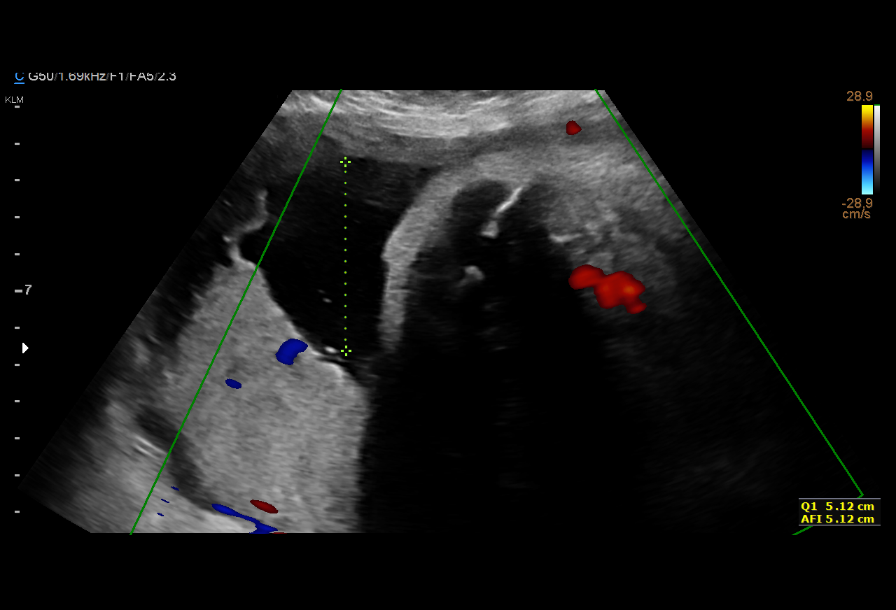
[im 12/22]
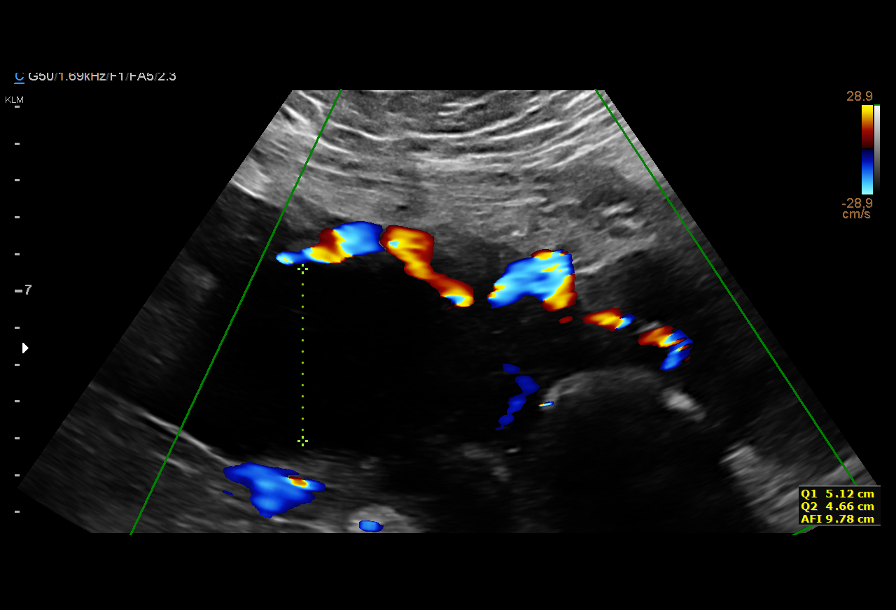
[im 14/22]
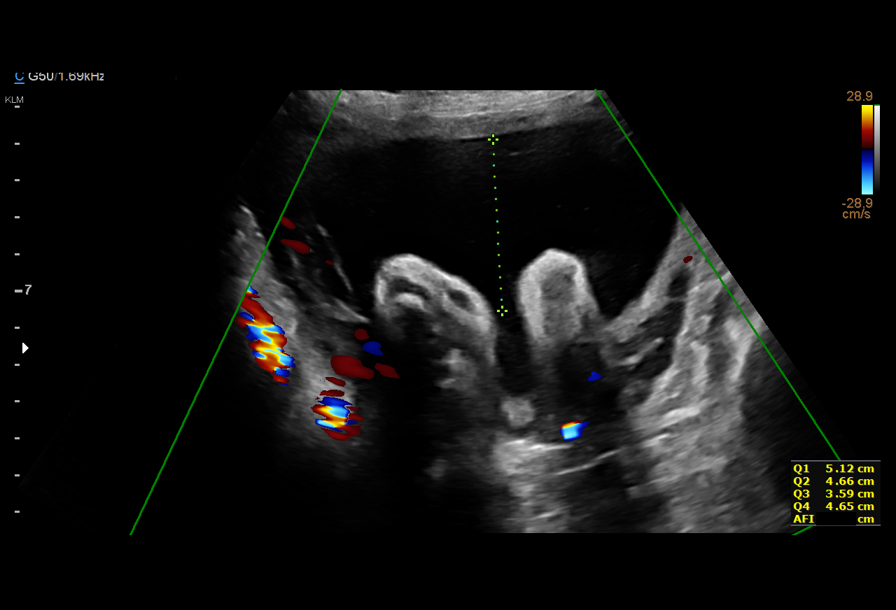
[im 16/22]
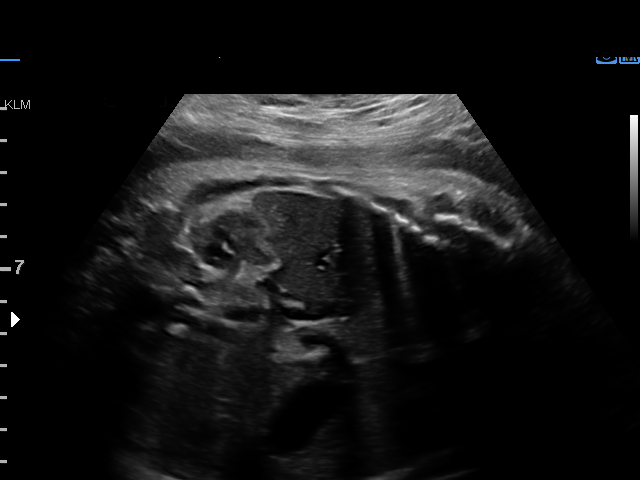
[im 18/22]
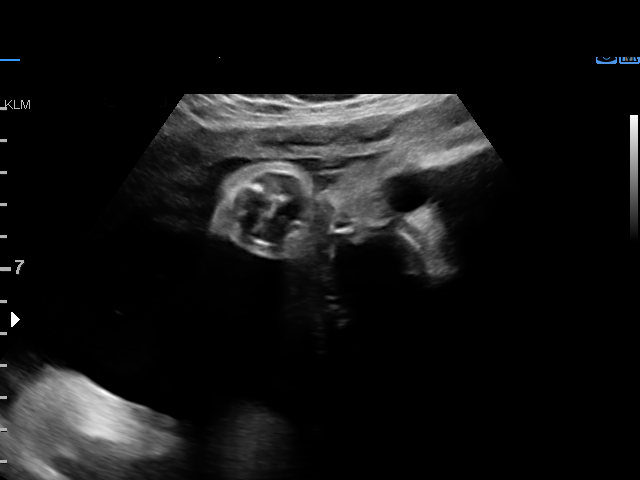
[im 20/22]
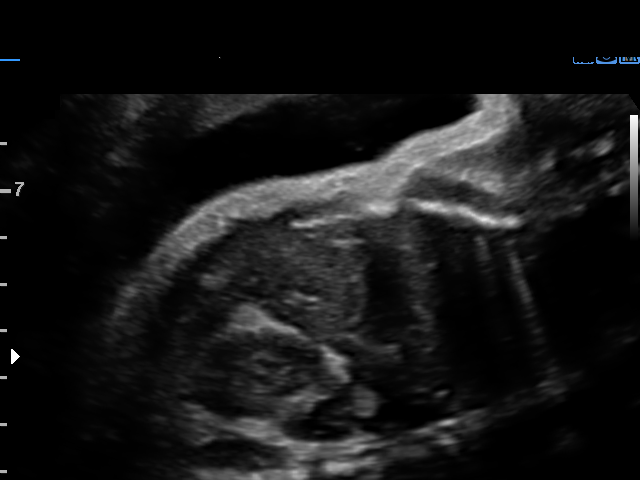
[im 22/22]
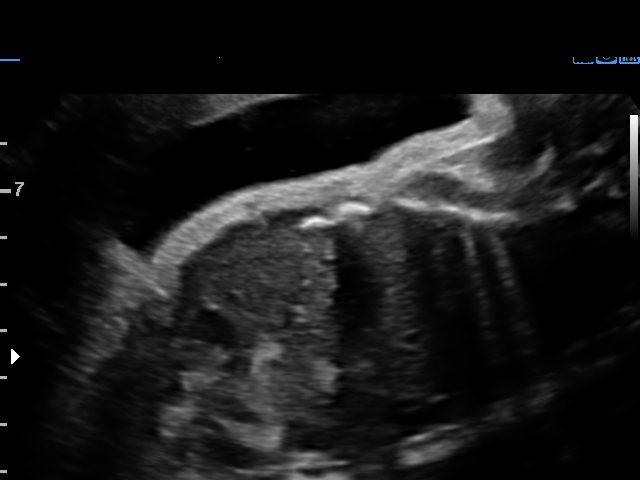

[12 of 22 positions shown; findings below may reference images not displayed]

1  PRZ              [PHONE_NUMBER]      [PHONE_NUMBER]     [PHONE_NUMBER]
Indications

34 weeks gestation of pregnancy
Pre-existing diabetes, type 2, in pregnancy,   [UU]
third trimester-glyburide
Advanced maternal age multigravida (41),       [UU]
third trimester
Umbilical vein abnormality complicating        [UU]
pregnancy
OB History

Blood Type:            Height:  5'2"   Weight (lb):  202       BMI:
Gravidity:    6         Term:   4
Ectopic:      1        Living:  4
Fetal Evaluation

Num Of Fetuses:     1
Fetal Heart         151
Rate(bpm):
Cardiac Activity:   Observed
Presentation:       Cephalic

Amniotic Fluid
AFI FV:      Subjectively within normal limits

AFI Sum(cm)     %Tile       Largest Pocket(cm)
18.1            67

RUQ(cm)       RLQ(cm)       LUQ(cm)        LLQ(cm)
5.1

Comment:    UVV 1.2cm, [DATE] BPP in 10 minutes
Biophysical Evaluation

Amniotic F.V:   Within normal limits       F. Tone:        Observed
F. Movement:    Observed                   Score:          [DATE]
F. Breathing:   Observed
Gestational Age

LMP:           34w 4d        Date:  [DATE]                 EDD:   [DATE]
Best:          34w 4d     Det. By:  LMP  ([DATE])          EDD:   [DATE]
Impression

Intrauterine pregnancy at 34+4 weeks with umbilical vein
varix and maternal age >40 years here for UVV surveillance
Normal amniotic fluid
BPP [DATE]
UVV measures 12mm today
There is an area that may constitute turbulent flow within the
varix
Recommendations

Will recheck UVV and BPP on [REDACTED] and [REDACTED] this
week; if there are changes in the flow within the varix that are
confirmed may consider delivery

## 2018-02-28 ENCOUNTER — Encounter (HOSPITAL_COMMUNITY): Payer: Self-pay

## 2018-02-28 ENCOUNTER — Ambulatory Visit (HOSPITAL_COMMUNITY)
Admission: RE | Admit: 2018-02-28 | Discharge: 2018-02-28 | Disposition: A | Discharge status: Home / Self Care | Payer: Self-pay | Source: Ambulatory Visit | Attending: Obstetrics & Gynecology | Admitting: Obstetrics & Gynecology

## 2018-02-28 DIAGNOSIS — Z3009 Encounter for other general counseling and advice on contraception: Secondary | ICD-10-CM

## 2018-02-28 DIAGNOSIS — Z3A34 34 weeks gestation of pregnancy: Secondary | ICD-10-CM | POA: Insufficient documentation

## 2018-02-28 DIAGNOSIS — IMO0001 Reserved for inherently not codable concepts without codable children: Secondary | ICD-10-CM

## 2018-02-28 IMAGING — US US MFM FETAL BPP W/O NON-STRESS
1 series · 15 of 19 positions shown · non-contrast
Comparison: none

[Series 1: us mfm fetal bpp w/o non-stress · 19 acquisitions, 15 frames shown]
[im 1/19]
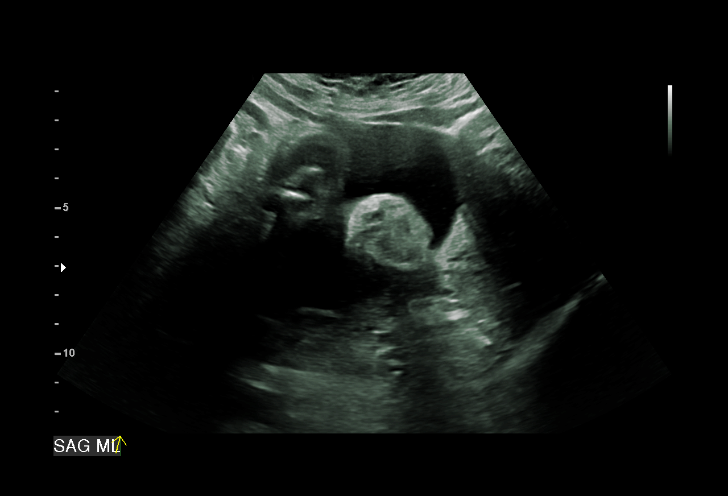
[im 2/19]
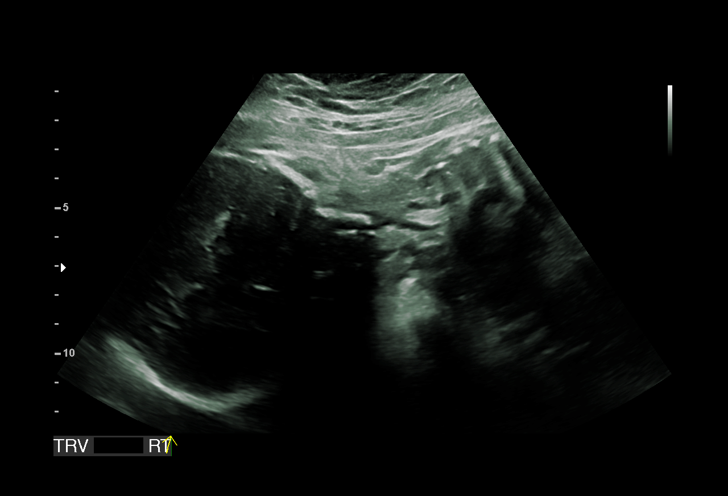
[im 4/19]
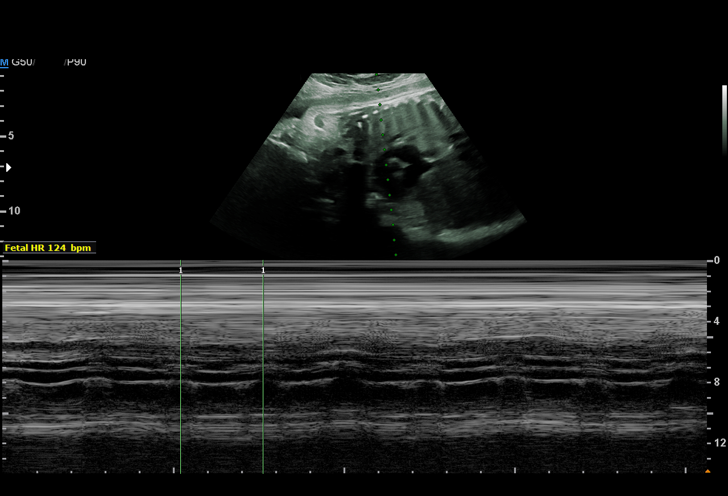
[im 5/19]
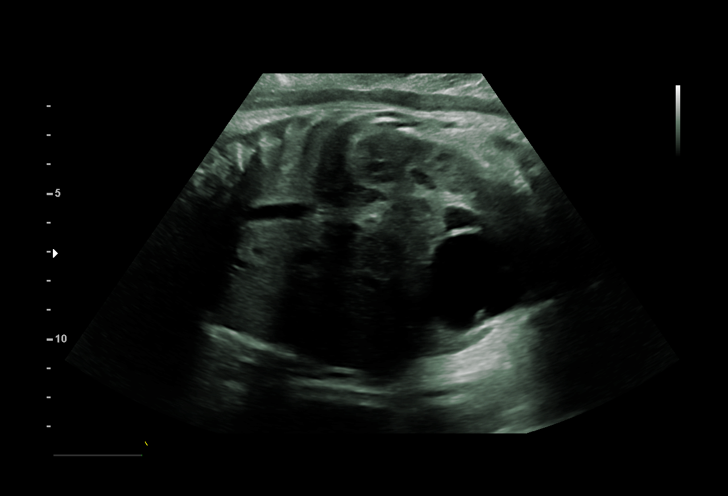
[im 6/19]
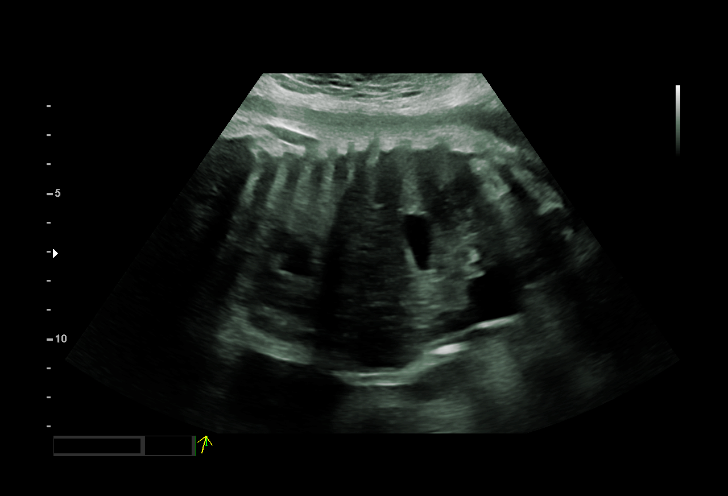
[im 7/19]
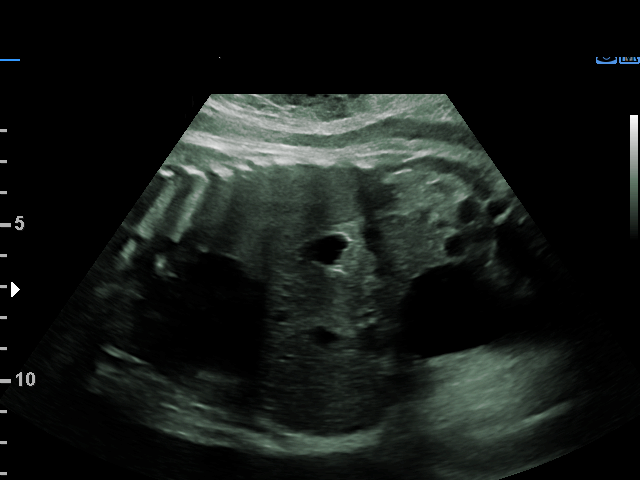
[im 9/19]
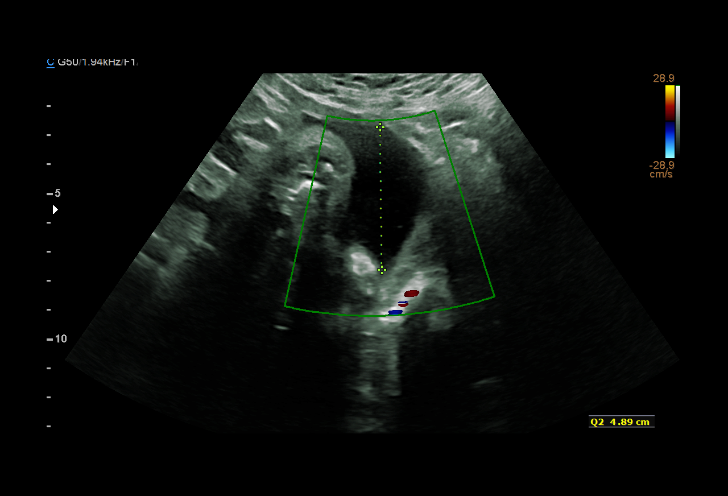
[im 10/19]
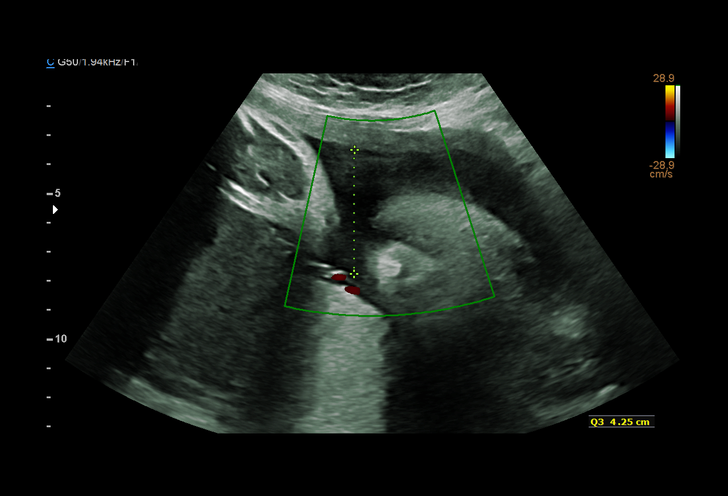
[im 11/19]
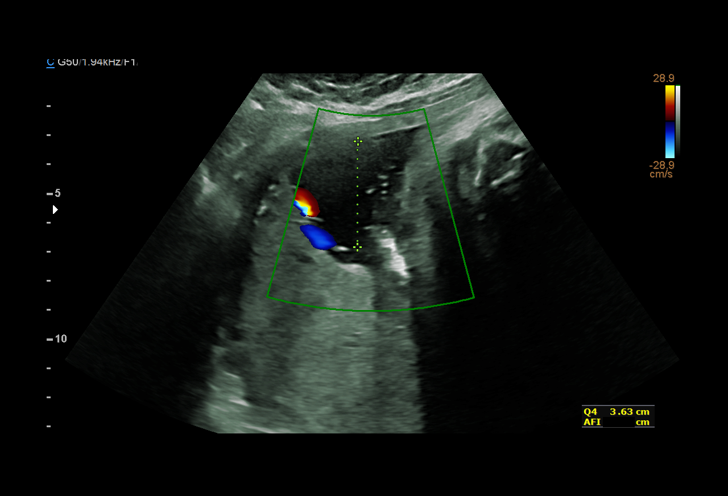
[im 13/19]
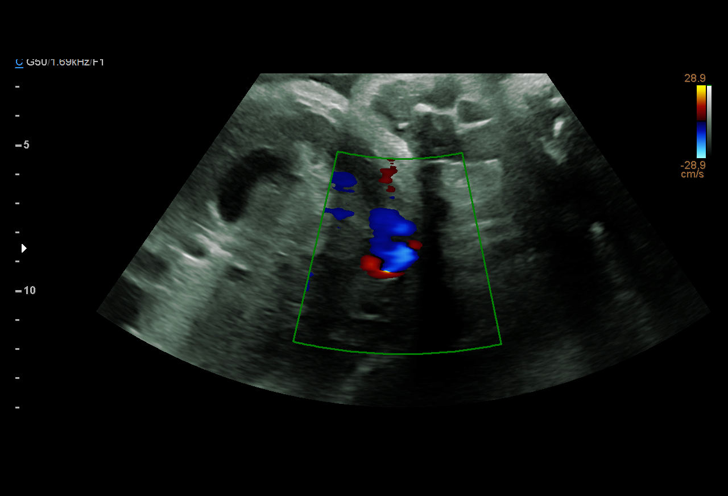
[im 14/19]
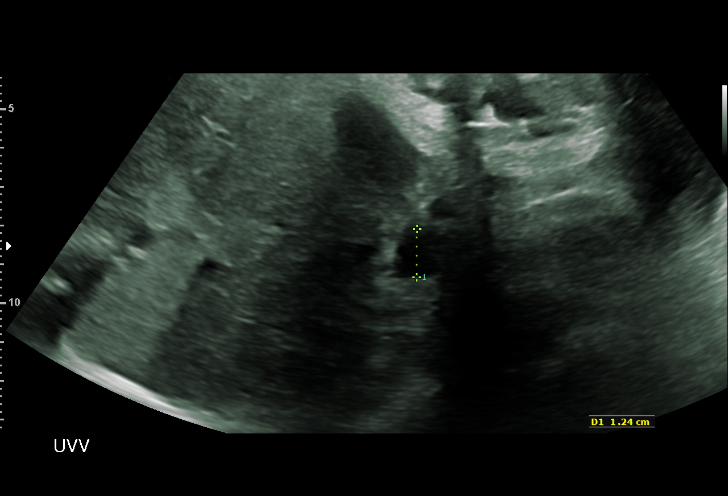
[im 15/19]
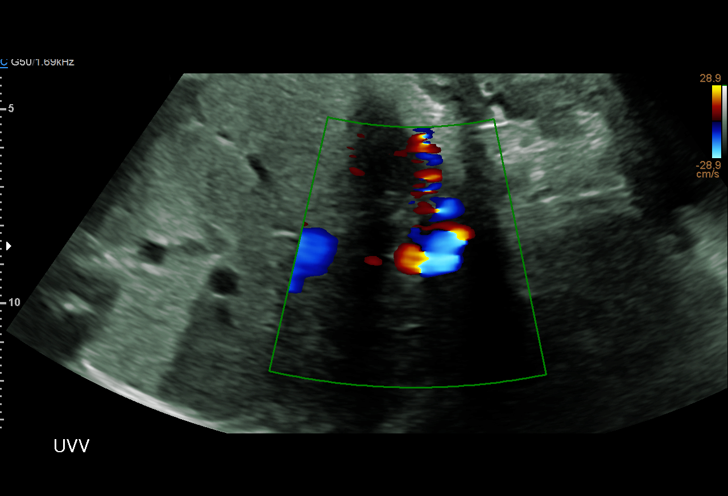
[im 16/19]
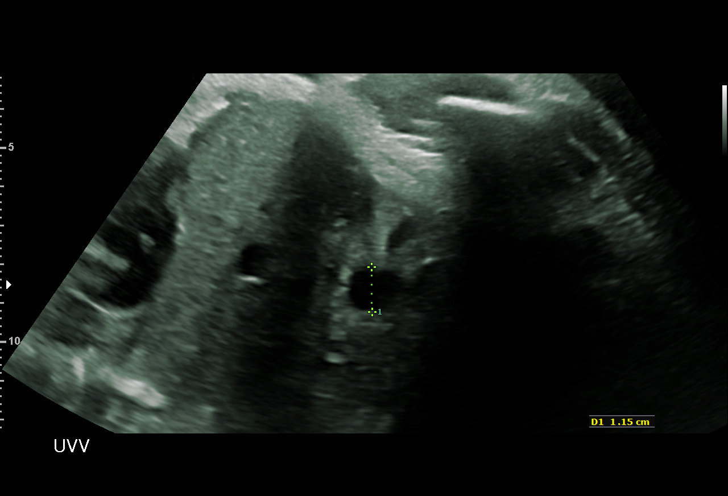
[im 18/19]
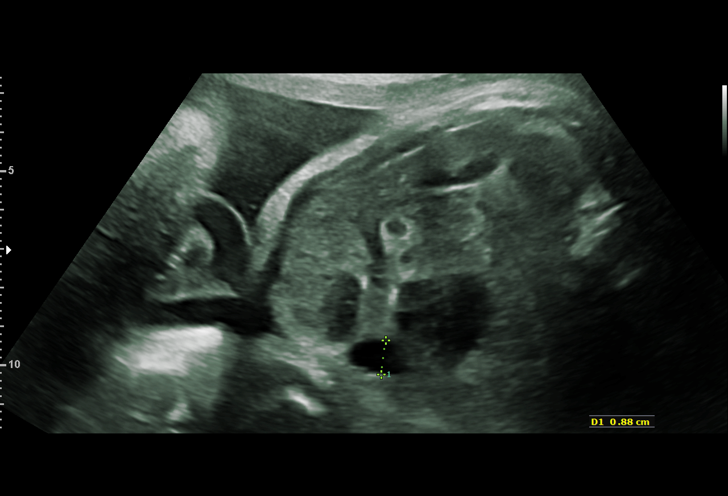
[im 19/19]
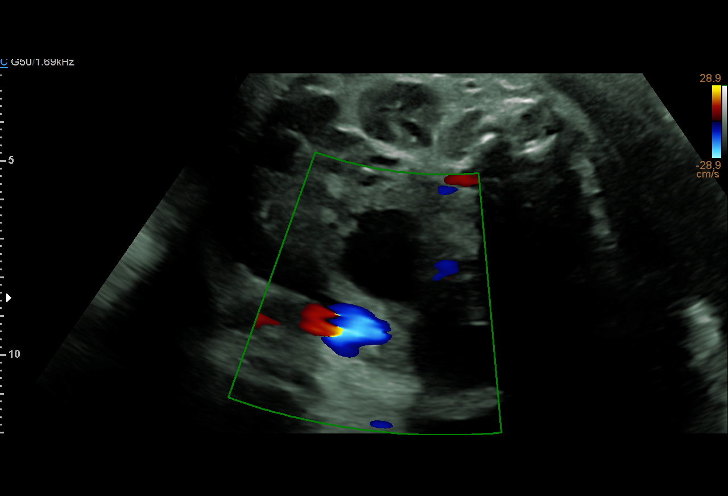

[15 of 19 positions shown; findings below may reference images not displayed]

1  WENG              [PHONE_NUMBER]      [PHONE_NUMBER]     [PHONE_NUMBER]
Indications

34 weeks gestation of pregnancy
Pre-existing diabetes, type 2, in pregnancy,   [ID]
third trimester-glyburide
Advanced maternal age multigravida (41),       [ID]
third trimester
Umbilical vein abnormality complicating        [ID]
pregnancy
OB History

Blood Type:            Height:  5'2"   Weight (lb):  202       BMI:
Gravidity:    6         Term:   4
Ectopic:      1        Living:  4
Fetal Evaluation

Num Of Fetuses:     1
Fetal Heart         124
Rate(bpm):
Cardiac Activity:   Observed
Presentation:       Transverse, head to maternal right

Amniotic Fluid
AFI FV:      Subjectively within normal limits

AFI Sum(cm)     %Tile       Largest Pocket(cm)
15.25           55

RUQ(cm)       RLQ(cm)       LUQ(cm)        LLQ(cm)
2.48
Biophysical Evaluation

Amniotic F.V:   Within normal limits       F. Tone:        Observed
F. Movement:    Observed                   Score:          [DATE]
F. Breathing:   Observed
Gestational Age

LMP:           34w 6d        Date:  [DATE]                 EDD:   [DATE]
Best:          34w 6d     Det. By:  LMP  ([DATE])          EDD:   [DATE]
Anatomy

Stomach:               Appears normal, left   Bladder:                Appears normal
sided

Other:  Technically difficult due to fetal position.
Impression

Intrauterine pregnancy at 34+6 weeks with umbilical vein
varix and maternal age >40 years here for UVV surveillance
Normal amniotic fluid
BPP [DATE]
UVV measures 12mm today
The area that looked like turbulent flow within the varix 2 days
ago is no longer present
Recommendations

Will recheck UVV and BPP again on [REDACTED] ofthis week; if
there are changes in the flow within the varix that are
confirmed may consider delivery

## 2018-03-01 ENCOUNTER — Ambulatory Visit (INDEPENDENT_AMBULATORY_CARE_PROVIDER_SITE_OTHER): Payer: Self-pay | Admitting: Obstetrics & Gynecology

## 2018-03-01 ENCOUNTER — Ambulatory Visit (HOSPITAL_COMMUNITY): Payer: Self-pay

## 2018-03-01 ENCOUNTER — Ambulatory Visit: Payer: Self-pay

## 2018-03-01 ENCOUNTER — Ambulatory Visit (INDEPENDENT_AMBULATORY_CARE_PROVIDER_SITE_OTHER): Payer: Self-pay | Admitting: *Deleted

## 2018-03-01 VITALS — BP 106/57 | HR 83 | Wt 204.0 lb

## 2018-03-01 DIAGNOSIS — O0990 Supervision of high risk pregnancy, unspecified, unspecified trimester: Secondary | ICD-10-CM

## 2018-03-01 DIAGNOSIS — Z98891 History of uterine scar from previous surgery: Secondary | ICD-10-CM

## 2018-03-01 DIAGNOSIS — O09523 Supervision of elderly multigravida, third trimester: Secondary | ICD-10-CM

## 2018-03-01 DIAGNOSIS — O24919 Unspecified diabetes mellitus in pregnancy, unspecified trimester: Secondary | ICD-10-CM

## 2018-03-01 LAB — POCT URINALYSIS DIP (DEVICE)
Bilirubin Urine: NEGATIVE
GLUCOSE, UA: NEGATIVE mg/dL
Hgb urine dipstick: NEGATIVE
KETONES UR: NEGATIVE mg/dL
LEUKOCYTES UA: NEGATIVE
Nitrite: NEGATIVE
Protein, ur: NEGATIVE mg/dL
SPECIFIC GRAVITY, URINE: 1.01 (ref 1.005–1.030)
UROBILINOGEN UA: 0.2 mg/dL (ref 0.0–1.0)
pH: 7 (ref 5.0–8.0)

## 2018-03-01 NOTE — Progress Notes (Signed)
Live interpreter present for encounter. Pt has BPP @ MFM tomorrow. She states she was told by MFM that she will deliver @ 37 wk.  Pt also reports having constipation x2 days.

## 2018-03-01 NOTE — Patient Instructions (Signed)

## 2018-03-01 NOTE — Progress Notes (Signed)
   PRENATAL VISIT NOTE  Subjective:  Carolyn Mendez is a 42 y.o. [id number] at 5238w0d being seen today for ongoing prenatal care.  She is currently monitored for the following issues for this high-risk pregnancy and has OBESITY; Depression; Irritable bowel disease; Supervision of high risk pregnancy, antepartum; Diabetes mellitus complicating pregnancy, antepartum; History of VBAC; AMA (advanced maternal age) multigravida 35+; Unwanted fertility; and Language barrier on their problem list.  Patient reports backache and constipation.  Contractions: Irregular. Vag. Bleeding: None.  Movement: Present. Denies leaking of fluid.   The following portions of the patient's history were reviewed and updated as appropriate: allergies, current medications, past family history, past medical history, past social history, past surgical history and problem list. Problem list updated.  Objective:   Vitals:   03/01/18 1533  BP: (!) 106/57  Pulse: 83  Weight: 204 lb (92.5 kg)    Fetal Status: Fetal Heart Rate (bpm): NST   Movement: Present     General:  Alert, oriented and cooperative. Patient is in no acute distress.  Skin: Skin is warm and dry. No rash noted.   Cardiovascular: Normal heart rate noted  Respiratory: Normal respiratory effort, no problems with respiration noted  Abdomen: Soft, gravid, appropriate for gestational age.  Pain/Pressure: Present     Pelvic: Cervical exam deferred        Extremities: Normal range of motion.  Edema: None  Mental Status:  Normal mood and affect. Normal behavior. Normal judgment and thought content.   Assessment and Plan:  Pregnancy: [id number] at 8338w0d  1. Supervision of high risk pregnancy, antepartum Breech, umbilical varix  2. Elderly multigravida in third trimester   3. Diabetes mellitus complicating pregnancy, antepartum Good control noted  4. History of VBAC If breech repeat CS  Preterm labor symptoms and general obstetric precautions including  but not limited to vaginal bleeding, contractions, leaking of fluid and fetal movement were reviewed in detail with the patient. Please refer to After Visit Summary for other counseling recommendations.  Return in about 1 week (around 03/08/2018).   Carolyn DarterJames Arnold, MD

## 2018-03-02 ENCOUNTER — Encounter (HOSPITAL_COMMUNITY): Payer: Self-pay

## 2018-03-02 ENCOUNTER — Ambulatory Visit (HOSPITAL_COMMUNITY)
Admission: RE | Admit: 2018-03-02 | Discharge: 2018-03-02 | Disposition: A | Discharge status: Home / Self Care | Payer: Self-pay | Source: Ambulatory Visit | Attending: Obstetrics & Gynecology | Admitting: Obstetrics & Gynecology

## 2018-03-02 ENCOUNTER — Other Ambulatory Visit (HOSPITAL_COMMUNITY): Payer: Self-pay | Admitting: Obstetrics and Gynecology

## 2018-03-02 DIAGNOSIS — O24113 Pre-existing diabetes mellitus, type 2, in pregnancy, third trimester: Secondary | ICD-10-CM | POA: Insufficient documentation

## 2018-03-02 DIAGNOSIS — O09523 Supervision of elderly multigravida, third trimester: Secondary | ICD-10-CM

## 2018-03-02 DIAGNOSIS — Z3A35 35 weeks gestation of pregnancy: Secondary | ICD-10-CM | POA: Insufficient documentation

## 2018-03-02 DIAGNOSIS — Z3009 Encounter for other general counseling and advice on contraception: Secondary | ICD-10-CM

## 2018-03-02 DIAGNOSIS — O358XX0 Maternal care for other (suspected) fetal abnormality and damage, not applicable or unspecified: Secondary | ICD-10-CM | POA: Insufficient documentation

## 2018-03-02 IMAGING — US US MFM FETAL BPP W/O NON-STRESS
1 series · 12 of 28 positions shown · non-contrast
Comparison: none

[Series 1: us mfm fetal bpp w/o non-stress · 42 acquisitions, 12 frames shown]
[im 2/42]
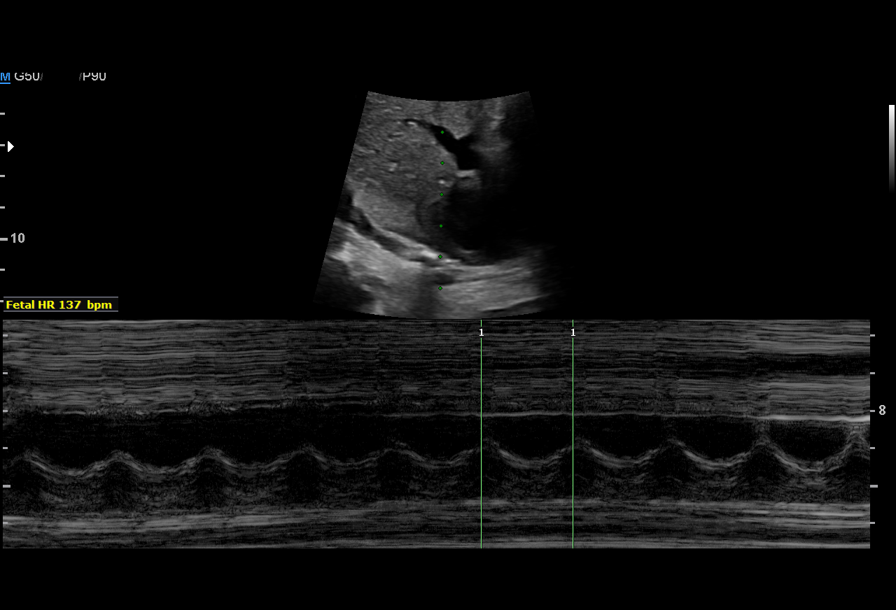
[im 5/42]
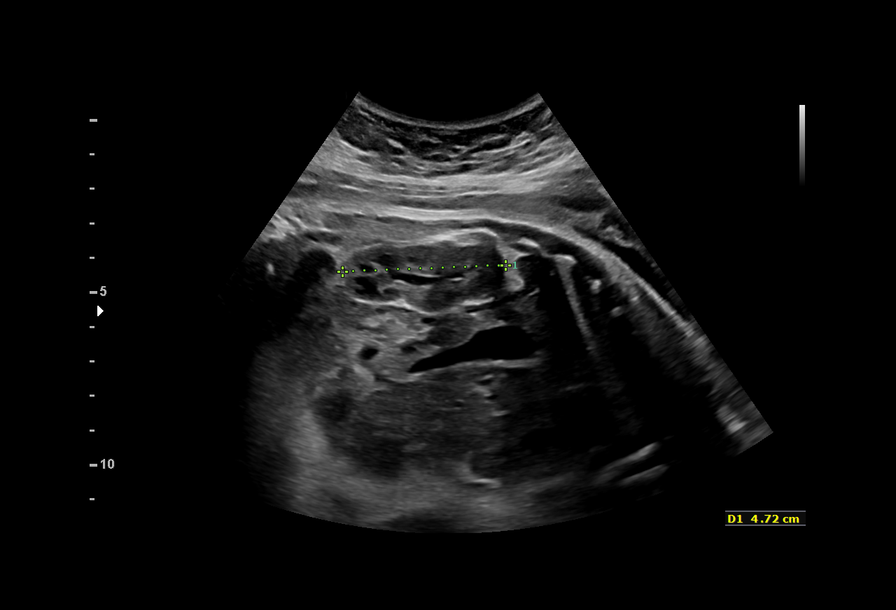
[im 8/42]
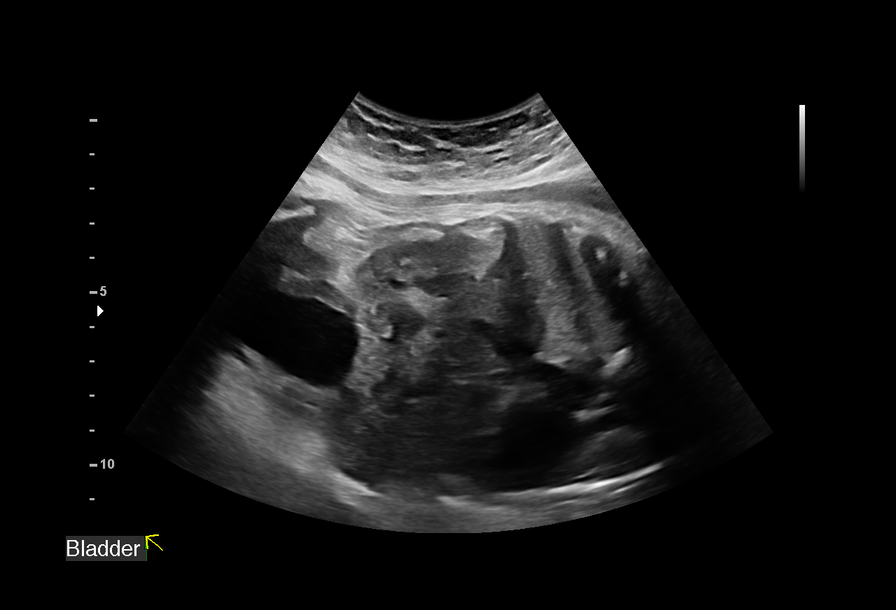
[im 13/42]
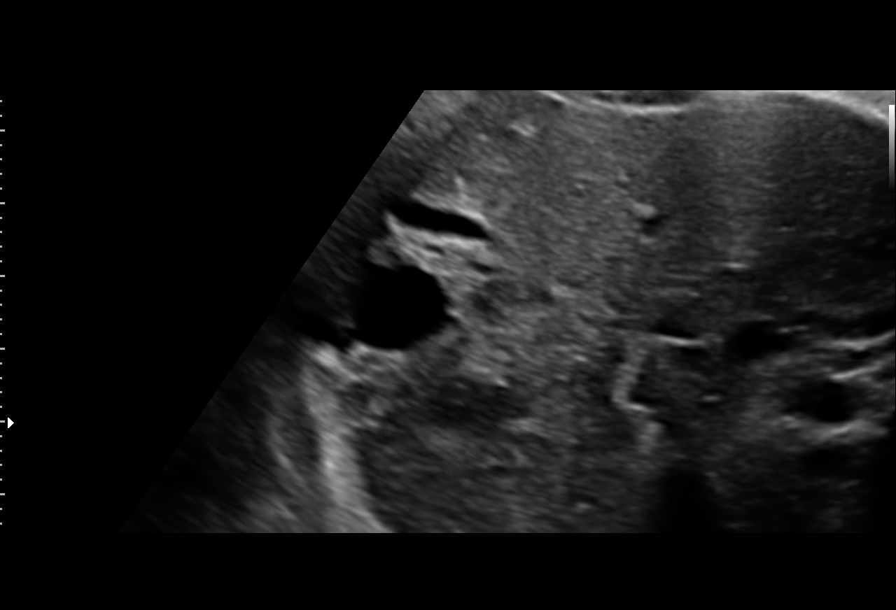
[im 16/42]
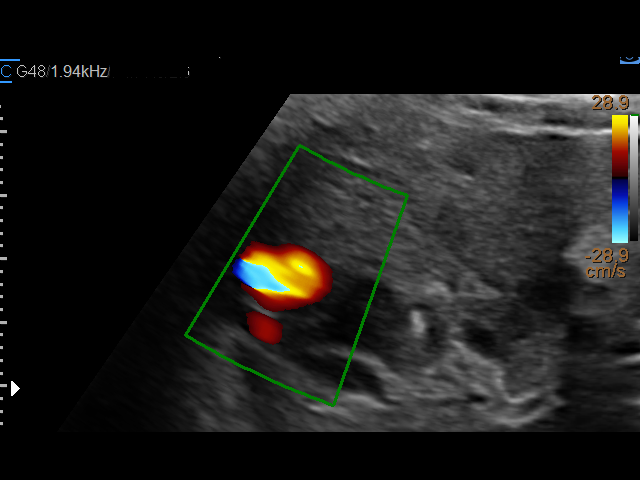
[im 19/42]
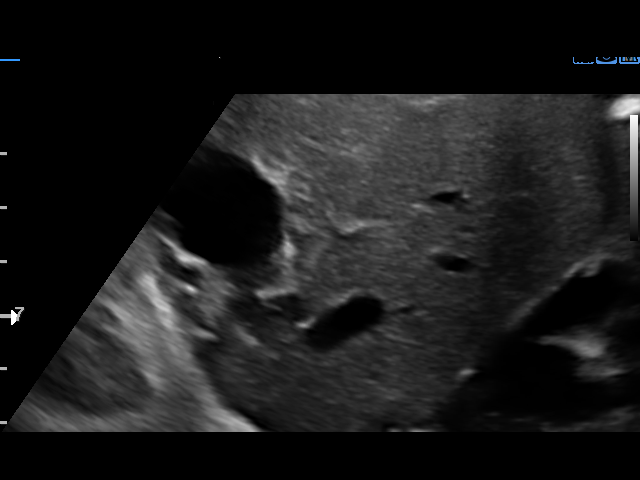
[im 23/42]
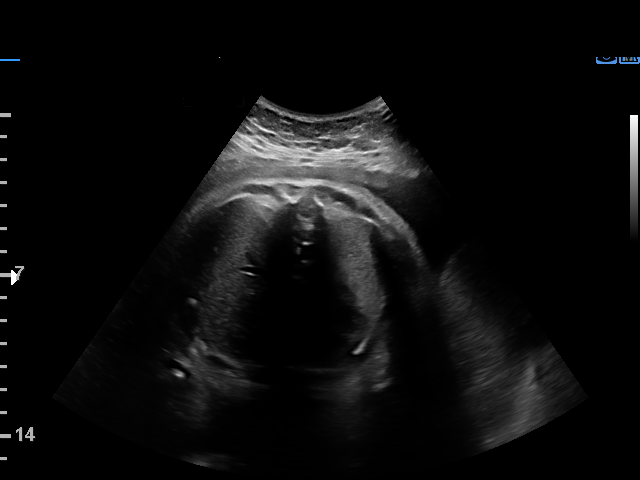
[im 26/42]
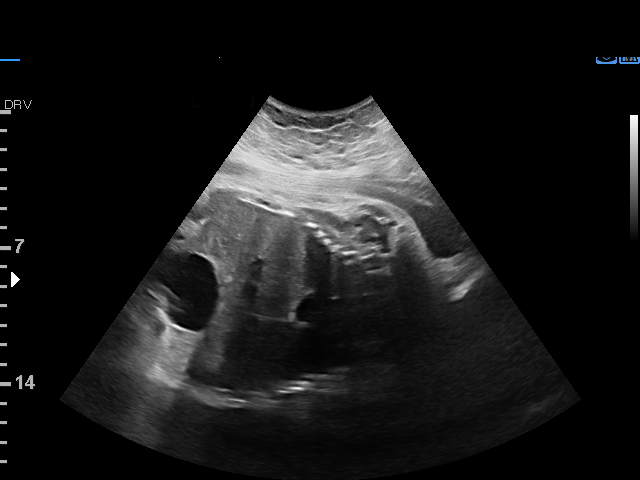
[im 29/42]
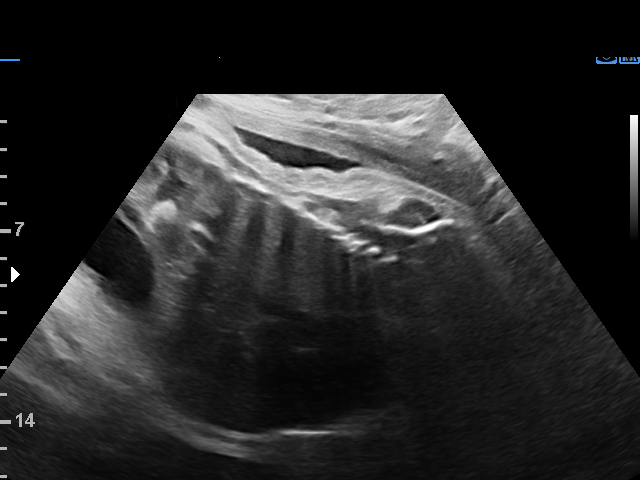
[im 34/42]
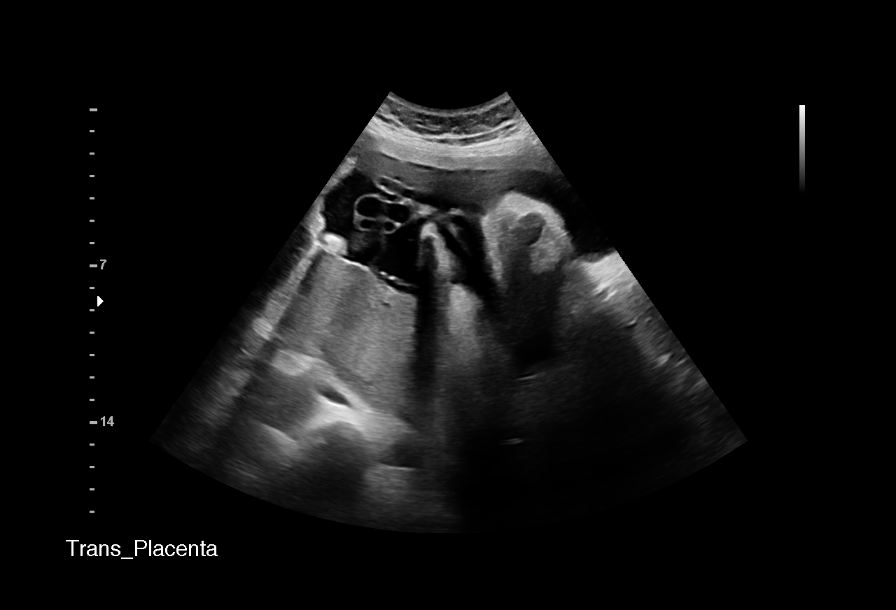
[im 37/42]
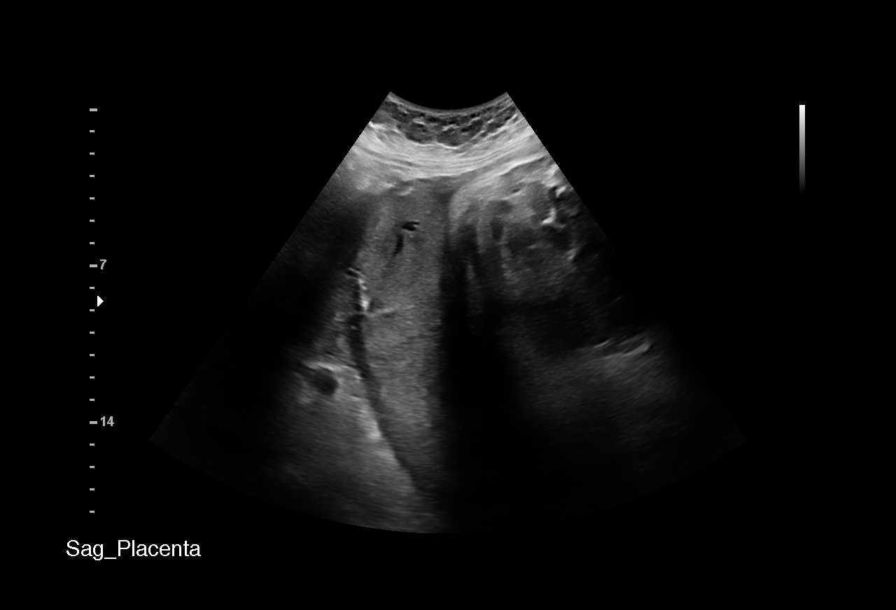
[im 40/42]
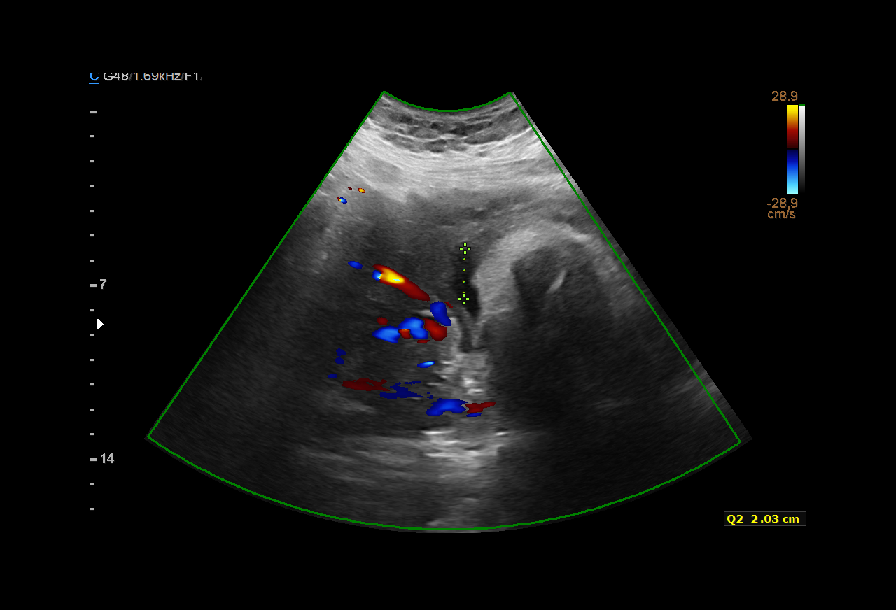

[12 of 28 positions shown; findings below may reference images not displayed]

1  MAJICE ZA              [PHONE_NUMBER]      [PHONE_NUMBER]     [PHONE_NUMBER]
Indications

35 weeks gestation of pregnancy
Pre-existing diabetes, type 2, in pregnancy,   [IM]
third trimester-glyburide
Advanced maternal age multigravida (41),       [IM]
third trimester
Umbilical vein abnormality complicating        [IM]
pregnancy
OB History

Blood Type:            Height:  5'2"   Weight (lb):  202       BMI:
Gravidity:    6         Term:   4
Ectopic:      1        Living:  4
Fetal Evaluation

Num Of Fetuses:     1
Fetal Heart         137
Rate(bpm):
Cardiac Activity:   Observed
Presentation:       Cephalic
Placenta:           Posterior, above cervical os
P. Cord Insertion:  Previously Visualized

Amniotic Fluid
AFI FV:      Subjectively within normal limits

AFI Sum(cm)     %Tile       Largest Pocket(cm)
13.23           45

RUQ(cm)       RLQ(cm)       LUQ(cm)        LLQ(cm)
3.6
Biophysical Evaluation

Amniotic F.V:   Within normal limits       F. Tone:        Observed
F. Movement:    Observed                   Score:          [DATE]
F. Breathing:   Observed
Gestational Age

LMP:           35w 1d        Date:  [DATE]                 EDD:   [DATE]
Best:          35w 1d     Det. By:  LMP  ([DATE])          EDD:   [DATE]
Anatomy

Stomach:               Appears normal, left   Kidneys:                Appear normal
sided
Abdomen:               Umbilical vein         Bladder:                Appears normal
varix = 1.2cm
Cervix Uterus Adnexa

Cervix
Not visualized (advanced GA >[IM])
Impression

SIUP at 35+1 weeks
UVV: no filling defects
Normal amniotic fluid volume
BPP [DATE]
Recommendations

Continue antenatal testing twice weekly
Recommend delivery by 37 weeks if maternal and fetal status
remain reassuring.

## 2018-03-05 ENCOUNTER — Ambulatory Visit (HOSPITAL_COMMUNITY)
Admission: RE | Admit: 2018-03-05 | Discharge: 2018-03-05 | Disposition: A | Discharge status: Home / Self Care | Payer: Self-pay | Source: Ambulatory Visit | Attending: Obstetrics & Gynecology | Admitting: Obstetrics & Gynecology

## 2018-03-05 ENCOUNTER — Other Ambulatory Visit (HOSPITAL_COMMUNITY): Payer: Self-pay | Admitting: Maternal & Fetal Medicine

## 2018-03-05 ENCOUNTER — Encounter (HOSPITAL_COMMUNITY): Payer: Self-pay

## 2018-03-05 DIAGNOSIS — O09523 Supervision of elderly multigravida, third trimester: Secondary | ICD-10-CM | POA: Insufficient documentation

## 2018-03-05 DIAGNOSIS — O24113 Pre-existing diabetes mellitus, type 2, in pregnancy, third trimester: Secondary | ICD-10-CM | POA: Insufficient documentation

## 2018-03-05 DIAGNOSIS — IMO0001 Reserved for inherently not codable concepts without codable children: Secondary | ICD-10-CM

## 2018-03-05 DIAGNOSIS — Z3009 Encounter for other general counseling and advice on contraception: Secondary | ICD-10-CM

## 2018-03-05 DIAGNOSIS — Z3A35 35 weeks gestation of pregnancy: Secondary | ICD-10-CM

## 2018-03-05 IMAGING — US US MFM FETAL BPP W/O NON-STRESS
1 series · 12 of 28 positions shown · non-contrast
Comparison: none

[Series 1: us mfm fetal bpp w/o non-stress · 32 acquisitions, 12 frames shown]
[im 2/32]
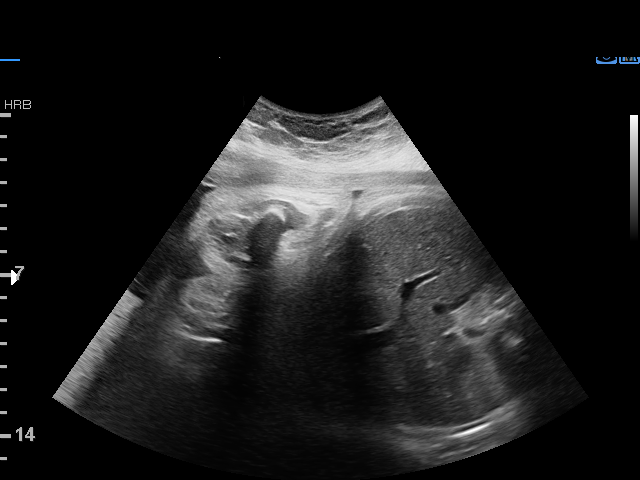
[im 4/32]
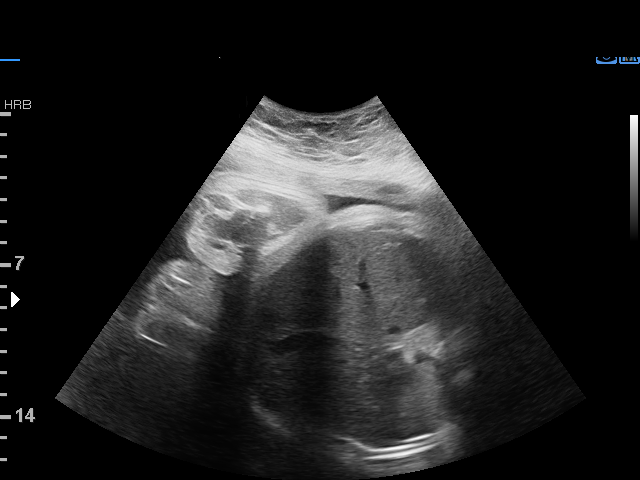
[im 6/32]
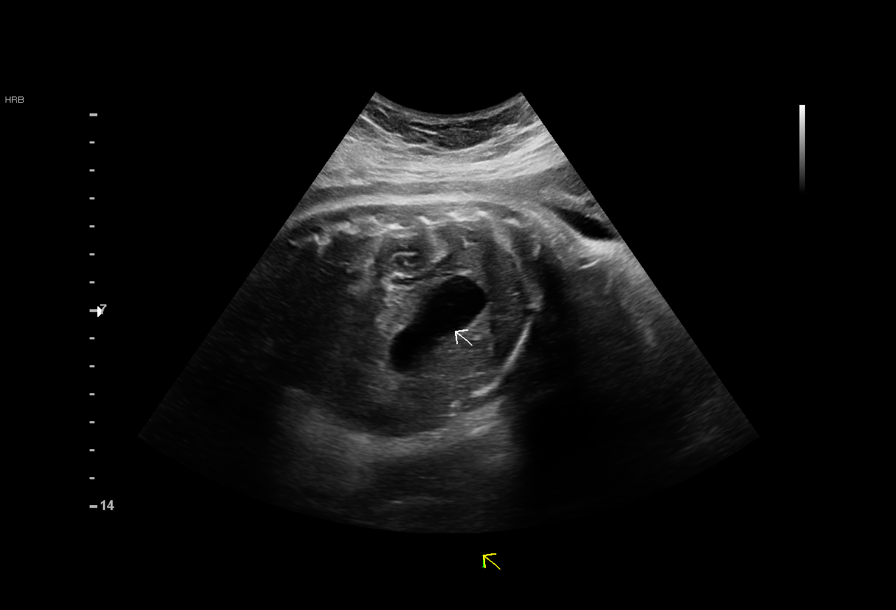
[im 10/32]
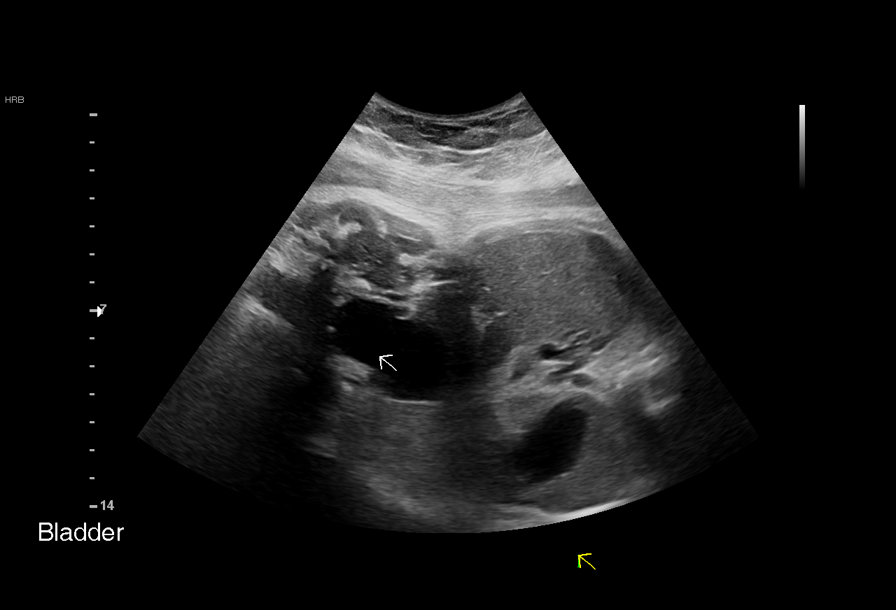
[im 12/32]
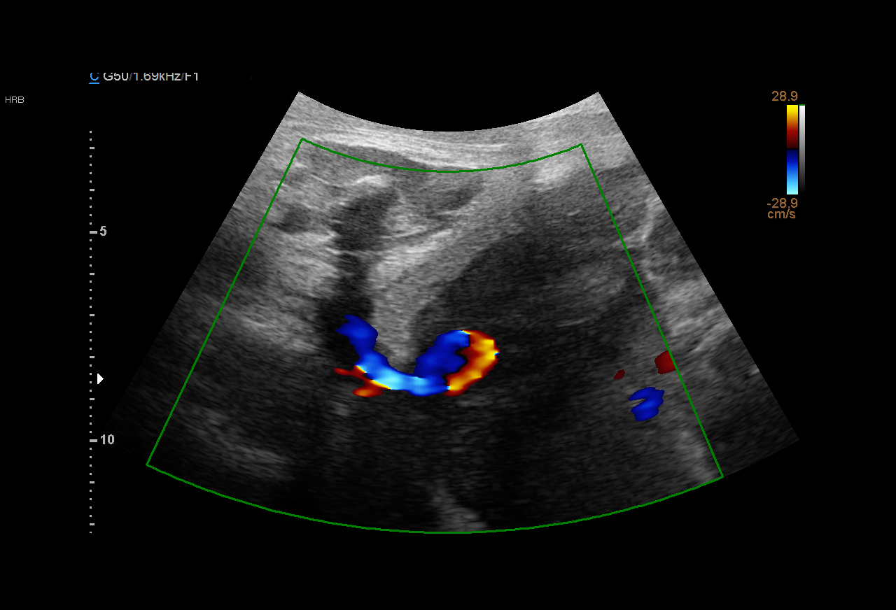
[im 14/32]
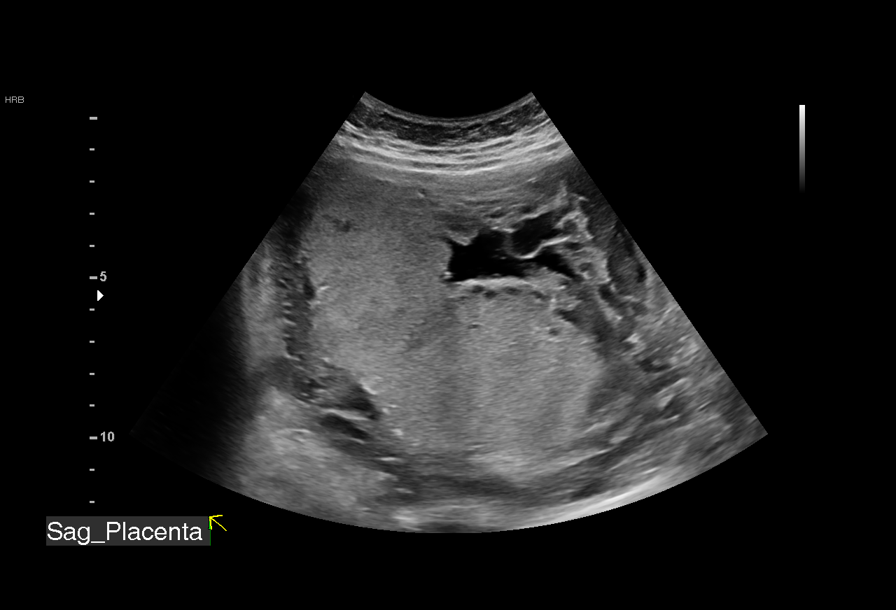
[im 18/32]
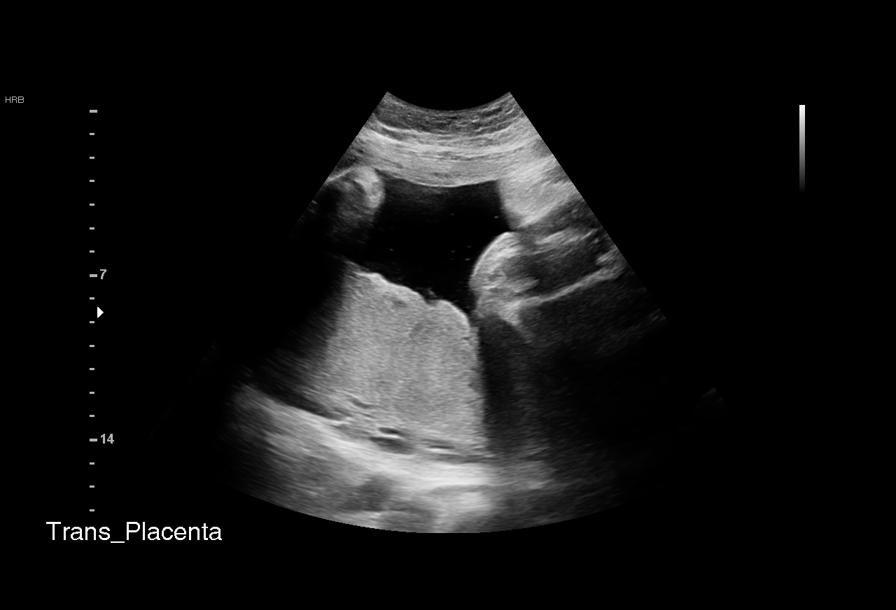
[im 20/32]
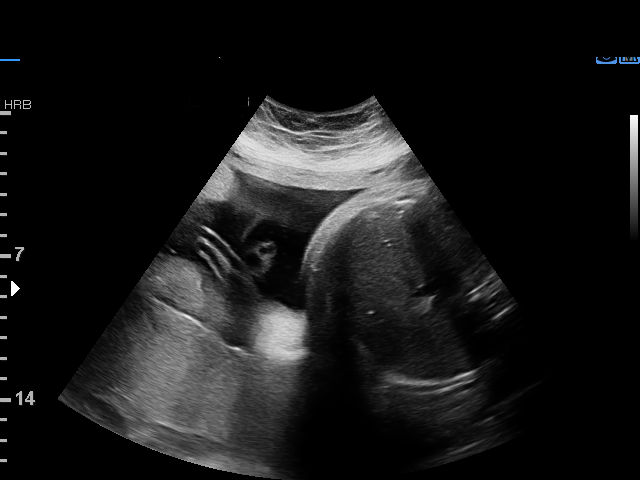
[im 22/32]
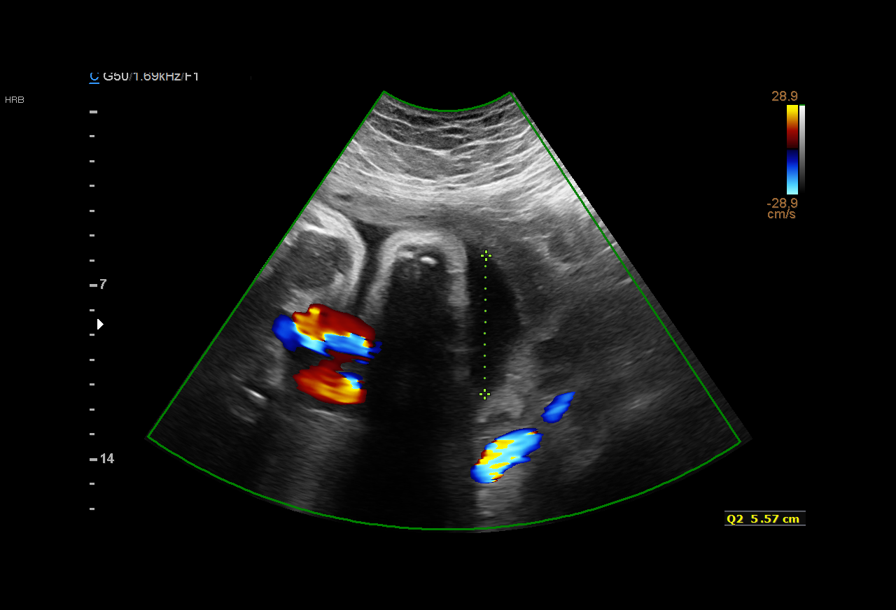
[im 26/32]
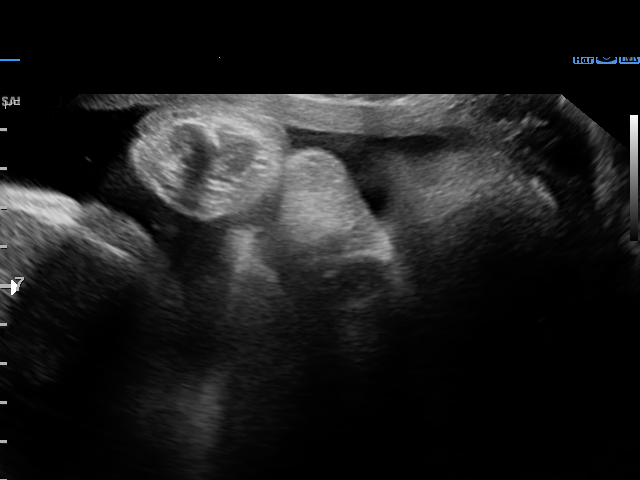
[im 28/32]
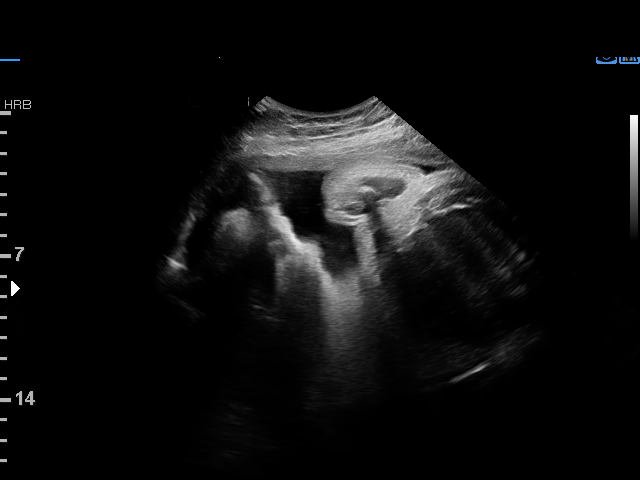
[im 30/32]
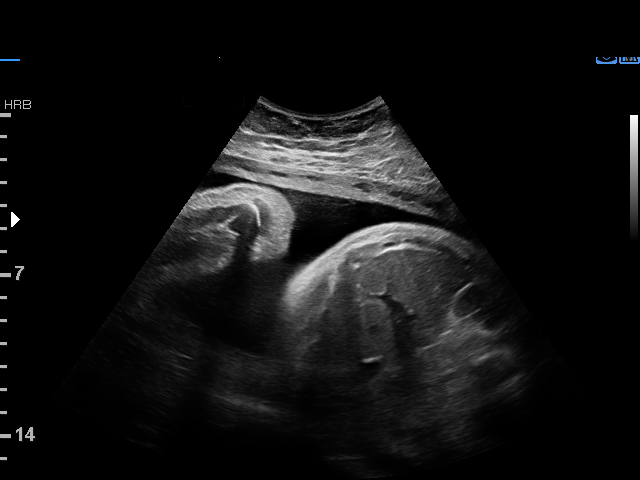

[12 of 28 positions shown; findings below may reference images not displayed]

1  DON LOLITO              [PHONE_NUMBER]      [PHONE_NUMBER]     [PHONE_NUMBER]
Indications

35 weeks gestation of pregnancy
Pre-existing diabetes, type 2, in pregnancy,   [Y2]
third trimester-glyburide; normal ECHO
Advanced maternal age multigravida (41),       [Y2]
third trimester; low risk quad screen
Umbilical vein abnormality complicating        [Y2]
pregnancy
OB History

Blood Type:            Height:  5'2"   Weight (lb):  202       BMI:
Gravidity:    6         Term:   4
Ectopic:      1        Living:  4
Fetal Evaluation

Num Of Fetuses:     1
Cardiac Activity:   Observed
Presentation:       Cephalic
Placenta:           Posterior, above cervical os
P. Cord Insertion:  Previously Visualized

Amniotic Fluid
AFI FV:      Subjectively within normal limits

AFI Sum(cm)     %Tile       Largest Pocket(cm)
12.13           37

RUQ(cm)       RLQ(cm)       LUQ(cm)        LLQ(cm)
6.56          0             5.57           0
Biophysical Evaluation

Amniotic F.V:   Within normal limits       F. Tone:        Observed
F. Movement:    Observed                   Score:          [DATE]
F. Breathing:   Observed
Gestational Age

LMP:           35w 4d        Date:  [DATE]                 EDD:   [DATE]
Best:          35w 4d     Det. By:  LMP  ([DATE])          EDD:   [DATE]
Anatomy

Thoracic:              Appears normal         Kidneys:                Appear normal
Stomach:               Appears normal, left   Bladder:                Appears normal
sided
Cord Vessels:          UVV: 1.21cm
Cervix Uterus Adnexa

Cervix
Not visualized (advanced GA >[Y2])
Impression

SIUP at 35+4 weeks with cardiac activity
Cephalic presentation
UVV - no filling defects
Normal amniotic fluid volume
BPP [DATE]
Recommendations

Continue antenatal testing.
Recommend delivery by 37 weeks if maternal and fetal status
remain reassuring.

## 2018-03-08 ENCOUNTER — Ambulatory Visit (INDEPENDENT_AMBULATORY_CARE_PROVIDER_SITE_OTHER): Payer: Self-pay | Admitting: Obstetrics and Gynecology

## 2018-03-08 ENCOUNTER — Encounter (HOSPITAL_COMMUNITY): Payer: Self-pay

## 2018-03-08 ENCOUNTER — Other Ambulatory Visit (HOSPITAL_COMMUNITY): Payer: Self-pay | Admitting: Maternal & Fetal Medicine

## 2018-03-08 ENCOUNTER — Ambulatory Visit (HOSPITAL_COMMUNITY)
Admission: RE | Admit: 2018-03-08 | Discharge: 2018-03-08 | Disposition: A | Discharge status: Home / Self Care | Payer: Self-pay | Source: Ambulatory Visit | Attending: Obstetrics & Gynecology | Admitting: Obstetrics & Gynecology

## 2018-03-08 ENCOUNTER — Ambulatory Visit: Payer: Self-pay | Admitting: *Deleted

## 2018-03-08 VITALS — BP 100/51 | HR 88 | Wt 204.0 lb

## 2018-03-08 DIAGNOSIS — Z3A36 36 weeks gestation of pregnancy: Secondary | ICD-10-CM | POA: Insufficient documentation

## 2018-03-08 DIAGNOSIS — O09523 Supervision of elderly multigravida, third trimester: Secondary | ICD-10-CM | POA: Insufficient documentation

## 2018-03-08 DIAGNOSIS — O24113 Pre-existing diabetes mellitus, type 2, in pregnancy, third trimester: Secondary | ICD-10-CM

## 2018-03-08 DIAGNOSIS — O99891 Other specified diseases and conditions complicating pregnancy: Secondary | ICD-10-CM

## 2018-03-08 DIAGNOSIS — O24919 Unspecified diabetes mellitus in pregnancy, unspecified trimester: Secondary | ICD-10-CM

## 2018-03-08 DIAGNOSIS — O0990 Supervision of high risk pregnancy, unspecified, unspecified trimester: Secondary | ICD-10-CM

## 2018-03-08 DIAGNOSIS — Z98891 History of uterine scar from previous surgery: Secondary | ICD-10-CM

## 2018-03-08 DIAGNOSIS — IMO0001 Reserved for inherently not codable concepts without codable children: Secondary | ICD-10-CM

## 2018-03-08 DIAGNOSIS — O26899 Other specified pregnancy related conditions, unspecified trimester: Secondary | ICD-10-CM

## 2018-03-08 DIAGNOSIS — Z113 Encounter for screening for infections with a predominantly sexual mode of transmission: Secondary | ICD-10-CM

## 2018-03-08 HISTORY — DX: Other specified pregnancy related conditions, unspecified trimester: O26.899

## 2018-03-08 HISTORY — DX: Other specified diseases and conditions complicating pregnancy: O99.891

## 2018-03-08 LAB — POCT URINALYSIS DIP (DEVICE)
Bilirubin Urine: NEGATIVE
GLUCOSE, UA: NEGATIVE mg/dL
HGB URINE DIPSTICK: NEGATIVE
Ketones, ur: 40 mg/dL — AB
LEUKOCYTES UA: NEGATIVE
NITRITE: NEGATIVE
PROTEIN: NEGATIVE mg/dL
Specific Gravity, Urine: 1.015 (ref 1.005–1.030)
UROBILINOGEN UA: 1 mg/dL (ref 0.0–1.0)
pH: 7 (ref 5.0–8.0)

## 2018-03-08 IMAGING — US US MFM FETAL BPP W/O NON-STRESS
1 series · 15 of 28 positions shown · non-contrast
Comparison: none

[Series 1: us mfm fetal bpp w/o non-stress · 38 acquisitions, 15 frames shown]
[im 1/38]
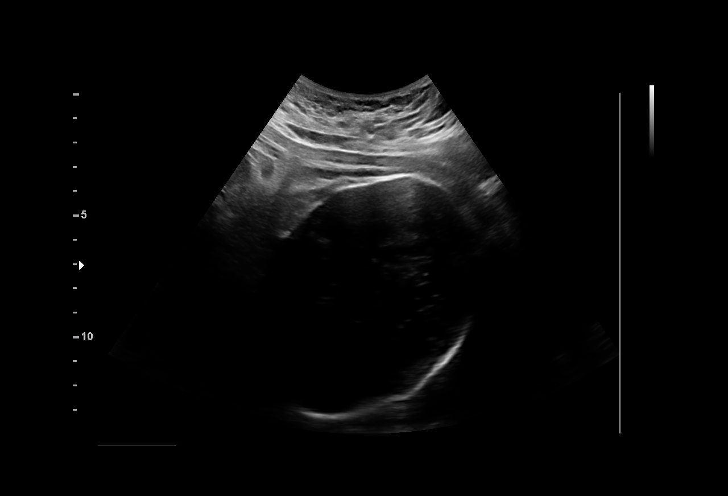
[im 3/38]
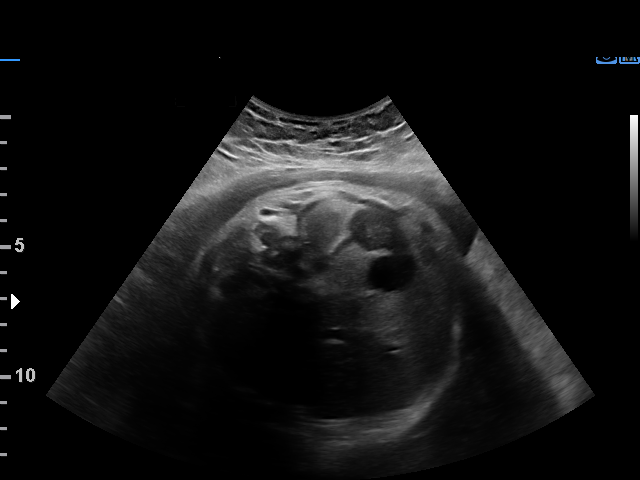
[im 6/38]
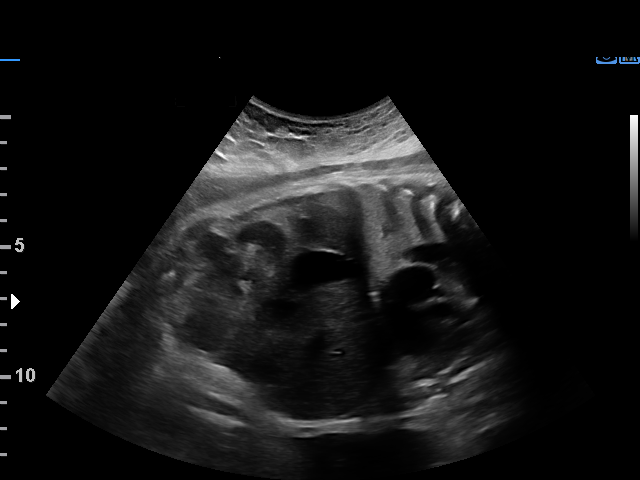
[im 9/38]
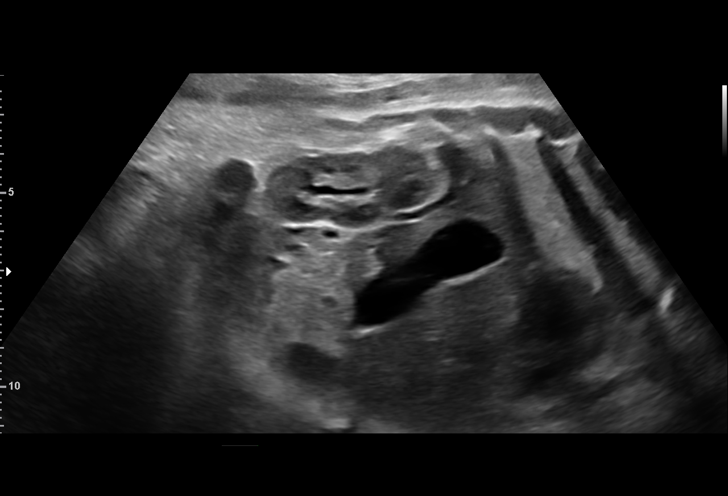
[im 11/38]
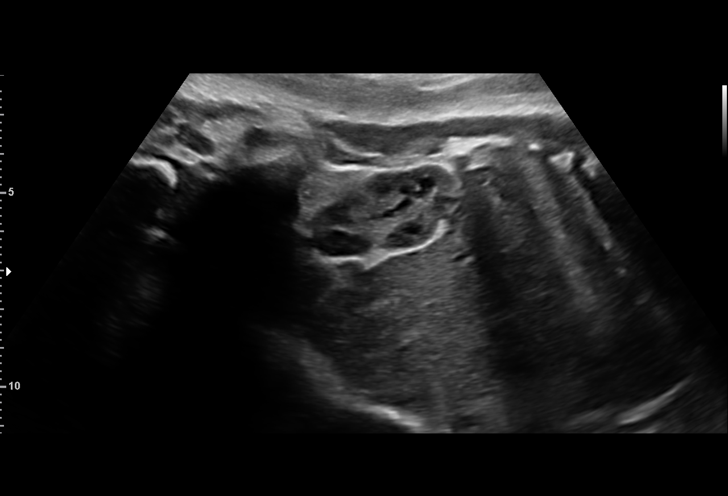
[im 14/38]
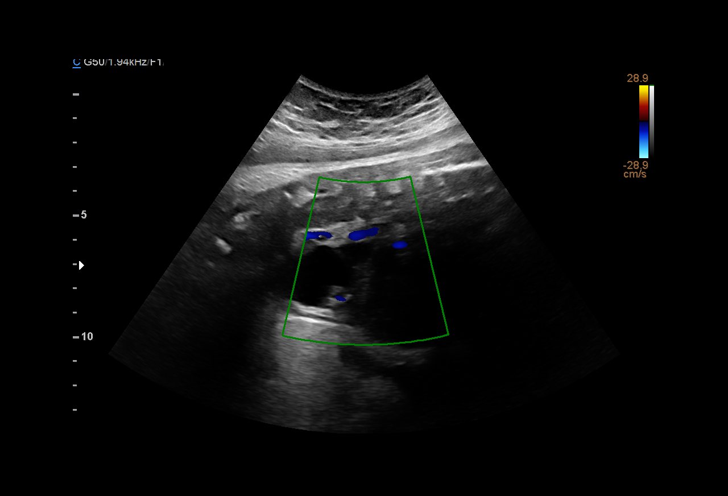
[im 17/38]
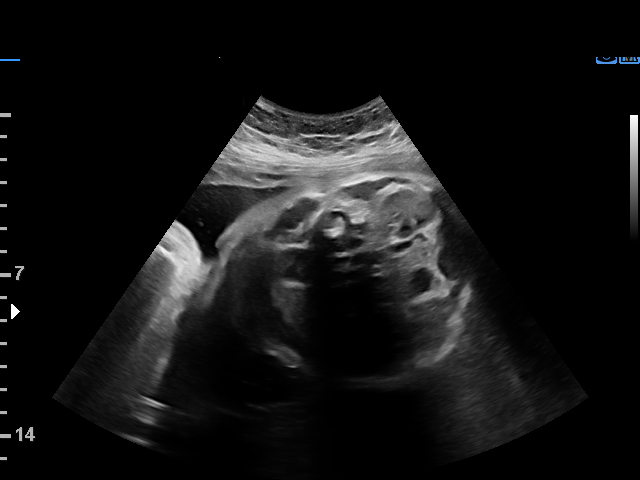
[im 20/38]
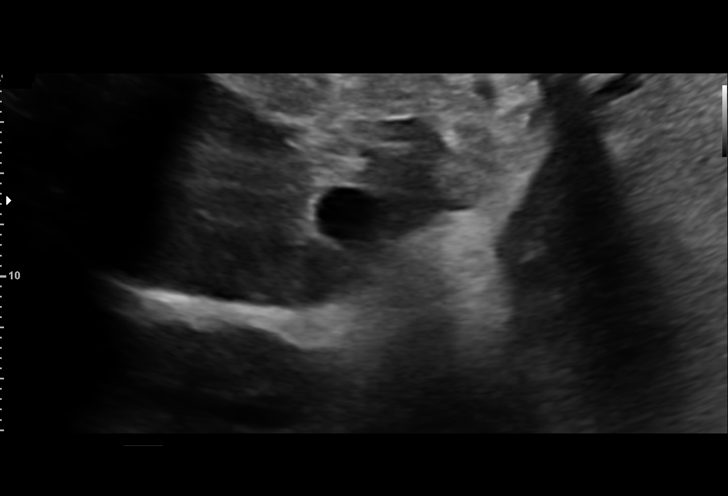
[im 21/38]
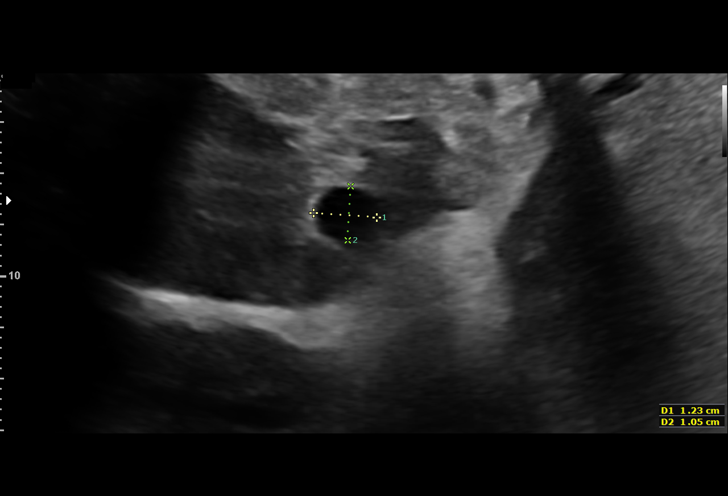
[im 24/38]
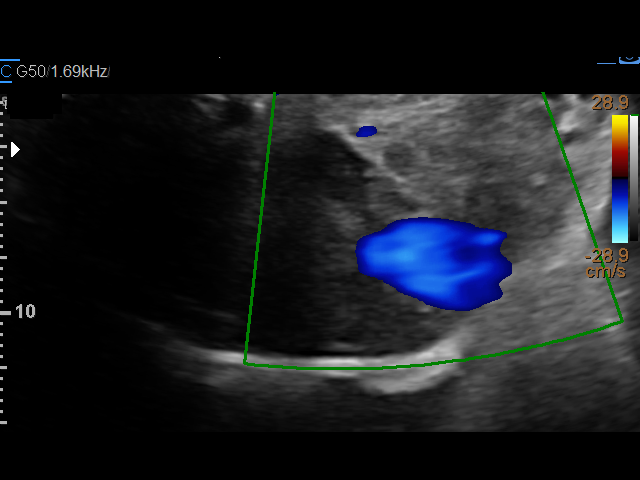
[im 27/38]
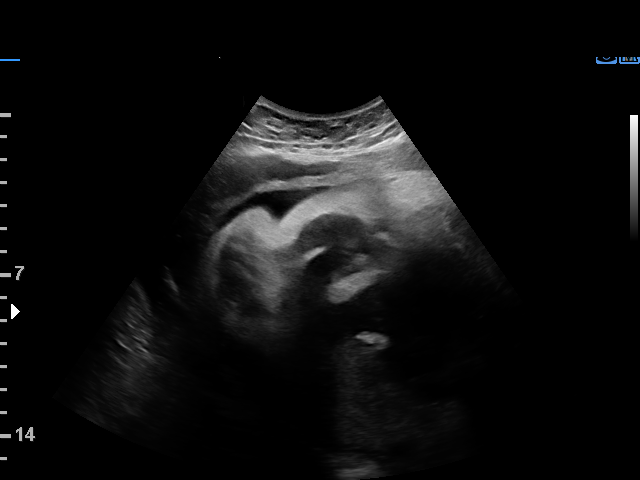
[im 29/38]
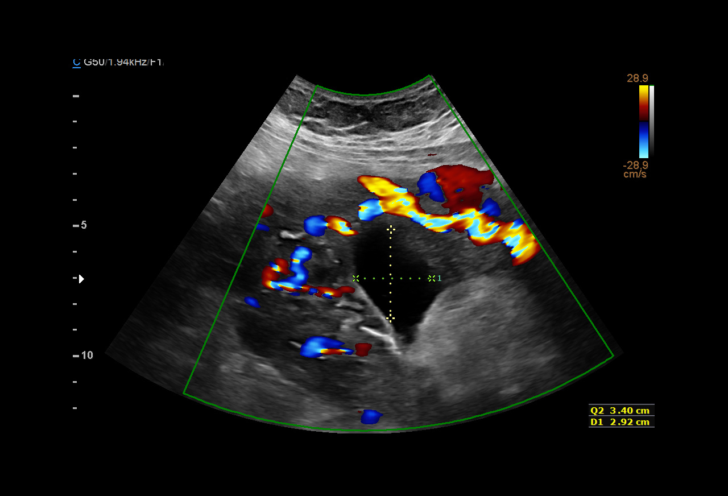
[im 32/38]
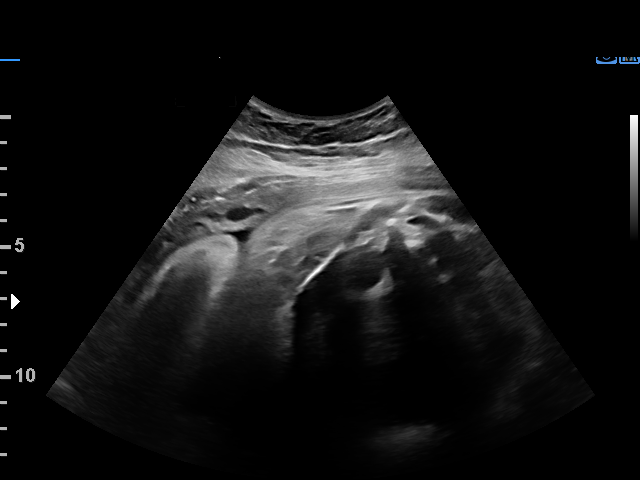
[im 35/38]
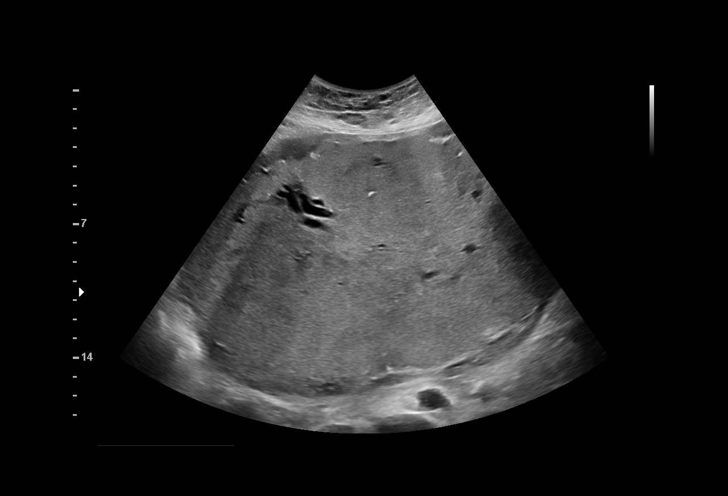
[im 38/38]
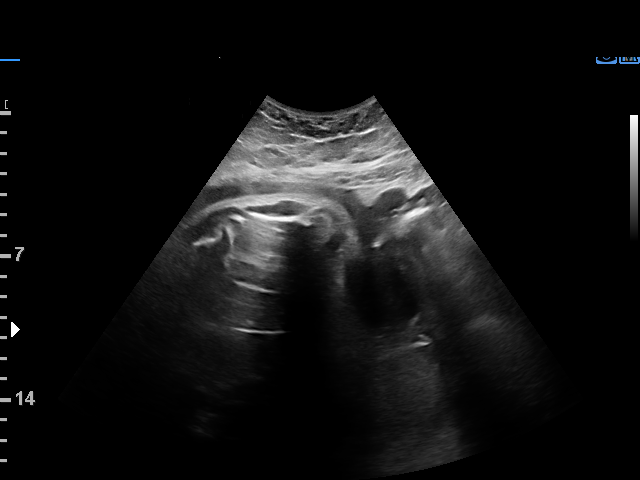

[15 of 28 positions shown; findings below may reference images not displayed]

1  MUYANG              [PHONE_NUMBER]      [PHONE_NUMBER]     [PHONE_NUMBER]
Indications

36 weeks gestation of pregnancy
Pre-existing diabetes, type 2, in pregnancy,   [IC]
third trimester-glyburide; normal ECHO
Advanced maternal age multigravida (41),       [IC]
third trimester; low risk quad screen
Umbilical vein abnormality complicating        [IC]
pregnancy
OB History

Blood Type:            Height:  5'2"   Weight (lb):  202       BMI:
Gravidity:    6         Term:   4
Ectopic:      1        Living:  4
Fetal Evaluation

Num Of Fetuses:     1
Fetal Heart         148
Rate(bpm):
Cardiac Activity:   Observed
Presentation:       Cephalic
Placenta:           Posterior, above cervical os
P. Cord Insertion:  Previously Visualized

Amniotic Fluid
AFI FV:      Subjectively within normal limits

AFI Sum(cm)     %Tile       Largest Pocket(cm)
7.44            4

RUQ(cm)       RLQ(cm)       LUQ(cm)        LLQ(cm)
2.27          0
Biophysical Evaluation

Amniotic F.V:   Pocket => 2 cm two         F. Tone:        Observed
planes
F. Movement:    Observed                   Score:          [DATE]
F. Breathing:   Observed
Gestational Age

LMP:           36w 0d        Date:  [DATE]                 EDD:   [DATE]
Best:          36w 0d     Det. By:  LMP  ([DATE])          EDD:   [DATE]
Anatomy

Stomach:               Appears normal, left   Kidneys:                Appear normal
sided
Abdomen:               Umbilical vein         Bladder:                Appears normal
varix=1.26cm
Cervix Uterus Adnexa

Cervix
Not visualized (advanced GA >[IC])
Impression

SIUP at 36+0 weeks
UVV - no filling defect
Low normal amniotic fluid volume
BPP [DATE]
Recommendations

UVV eval and BPP on [REDACTED] - induction at 37 weeks

## 2018-03-08 NOTE — Progress Notes (Signed)
Subjective:  Carolyn Mendez is a 42 y.o. [id number] at 8057w0d being seen today for ongoing prenatal care.  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  Patient reports no complaints.  Contractions: Irregular. Vag. Bleeding: None.  Movement: Present. Denies leaking of fluid.   The following portions of the patient's history were reviewed and updated as appropriate: allergies, current medications, past family history, past medical history, past social history, past surgical history and problem list. Problem list updated.  Objective:   Vitals:   03/08/18 1548  BP: (!) 100/51  Pulse: 88  Weight: 204 lb (92.5 kg)    Fetal Status: Fetal Heart Rate (bpm): NST   Movement: Present     General:  Alert, oriented and cooperative. Patient is in no acute distress.  Skin: Skin is warm and dry. No rash noted.   Cardiovascular: Normal heart rate noted  Respiratory: Normal respiratory effort, no problems with respiration noted  Abdomen: Soft, gravid, appropriate for gestational age. Pain/Pressure: Present     Pelvic:  Cervical exam deferred        Extremities: Normal range of motion.  Edema: None  Mental Status: Normal mood and affect. Normal behavior. Normal judgment and thought content.   Urinalysis: Urine Protein: Negative Urine Glucose: Negative  Assessment and Plan:  Pregnancy: [id number] at 4457w0d  1. Diabetes mellitus complicating pregnancy, antepartum CBG's within normal range Continue with diet and Glyburide BPP 10/10 today Continue with antenatal testing  2. Elderly multigravida in third trimester   3.  History of VBAC Desires TOLAC  4. Supervision of high risk pregnancy, antepartum  - GC[facility]Chlamydia [facility] amp [facility] - Strep Gp B NAA  5. Pregnancy complicated by umbilical cord varix, antepartum, single or unspecified fetus Stable For IOL at 37 weeks  Preterm labor symptoms and general obstetric precautions including but not limited to vaginal bleeding, contractions, leaking of fluid and fetal movement were reviewed in detail with the patient. Please refer to After Visit Summary for other counseling recommendations.  Return in about 5 weeks (around 04/12/2018).   Hermina StaggersErvin, Suhas Estis L, MD

## 2018-03-08 NOTE — Progress Notes (Signed)
Interpreter Carolyn Mendez present for encounter. Pt had BPP and evaluation of UVV @ MFM today.  IOL scheduled 3/21 @ 0730.

## 2018-03-09 ENCOUNTER — Telehealth (HOSPITAL_COMMUNITY): Payer: Self-pay | Admitting: *Deleted

## 2018-03-09 ENCOUNTER — Encounter (HOSPITAL_COMMUNITY): Payer: Self-pay | Admitting: *Deleted

## 2018-03-09 LAB — GC/CHLAMYDIA PROBE AMP (~~LOC~~) NOT AT ARMC
CHLAMYDIA, DNA PROBE: NEGATIVE
Neisseria Gonorrhea: NEGATIVE

## 2018-03-09 LAB — OB RESULTS CONSOLE GBS: GBS: NEGATIVE

## 2018-03-09 NOTE — Telephone Encounter (Signed)
Preadmission screen  

## 2018-03-10 LAB — STREP GP B NAA: STREP GROUP B AG: NEGATIVE

## 2018-03-12 ENCOUNTER — Encounter (HOSPITAL_COMMUNITY): Payer: Self-pay

## 2018-03-12 ENCOUNTER — Other Ambulatory Visit (HOSPITAL_COMMUNITY): Payer: Self-pay | Admitting: Maternal & Fetal Medicine

## 2018-03-12 ENCOUNTER — Ambulatory Visit (HOSPITAL_COMMUNITY)
Admission: RE | Admit: 2018-03-12 | Discharge: 2018-03-12 | Disposition: A | Discharge status: Home / Self Care | Payer: Self-pay | Source: Ambulatory Visit | Attending: Obstetrics & Gynecology | Admitting: Obstetrics & Gynecology

## 2018-03-12 DIAGNOSIS — Z3A36 36 weeks gestation of pregnancy: Secondary | ICD-10-CM | POA: Insufficient documentation

## 2018-03-12 DIAGNOSIS — O09523 Supervision of elderly multigravida, third trimester: Secondary | ICD-10-CM

## 2018-03-12 DIAGNOSIS — Z3009 Encounter for other general counseling and advice on contraception: Secondary | ICD-10-CM

## 2018-03-12 DIAGNOSIS — IMO0001 Reserved for inherently not codable concepts without codable children: Secondary | ICD-10-CM

## 2018-03-12 DIAGNOSIS — O24415 Gestational diabetes mellitus in pregnancy, controlled by oral hypoglycemic drugs: Secondary | ICD-10-CM | POA: Insufficient documentation

## 2018-03-12 IMAGING — US US MFM FETAL BPP W/O NON-STRESS
1 series · 15 of 23 positions shown · non-contrast
Comparison: none

1  KARDENAS              [PHONE_NUMBER]      [PHONE_NUMBER]     [PHONE_NUMBER]
Indications

36 weeks gestation of pregnancy
Pre-existing diabetes, type 2, in pregnancy,   [A6]
third trimester-glyburide; normal ECHO
Advanced maternal age multigravida (41),       [A6]
third trimester; low risk quad screen
Umbilical vein abnormality complicating        [A6]
pregnancy
OB History
Blood Type:            Height:  5'2"   Weight (lb):  202       BMI:
Gravidity:    6         Term:   4
Ectopic:      1        Living:  4
Fetal Evaluation
Num Of Fetuses:     1
Fetal Heart         153
Rate(bpm):
Cardiac Activity:   Observed
Presentation:       Cephalic
Placenta:           Posterior, above cervical os
Amniotic Fluid
AFI FV:      Subjectively within normal limits
AFI Sum(cm)     %Tile       Largest Pocket(cm)
15.55           58
RUQ(cm)       RLQ(cm)       LUQ(cm)        LLQ(cm)
2.79
Comment:    UVV 1.1cm no filling defect
Biophysical Evaluation
Amniotic F.V:   Within normal limits       F. Tone:        Observed
F. Movement:    Observed                   Score:          [DATE]
F. Breathing:   Observed
Gestational Age
LMP:           36w 4d        Date:  [DATE]                 EDD:   [DATE]
Best:          36w 4d     Det. By:  LMP  ([DATE])          EDD:   [DATE]
Anatomy
Heart:                 Appears normal         Abdomen:                Umbilical vein
(4CH, axis, and
situs)
varix 1.1cm
Stomach:               Appears normal, left   Bladder:                Appears normal
sided
Cervix Uterus Adnexa
Cervix
Not visualized (advanced GA >[A6])
Impression
INDICATION: 41 yr old [A6] at [A6] with type II diabetes
and fetal umbilical vein varix for BPP.

[Series 1: us mfm fetal bpp w/o non-stress · 23 acquisitions, 15 frames shown]
[im 1/23]
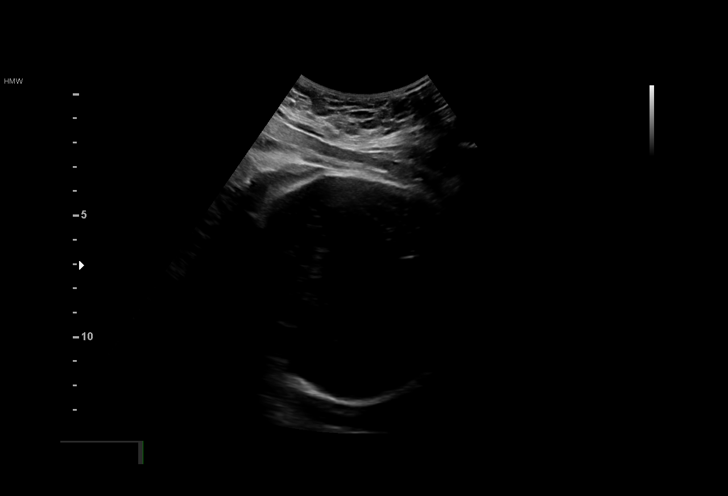
[im 3/23]
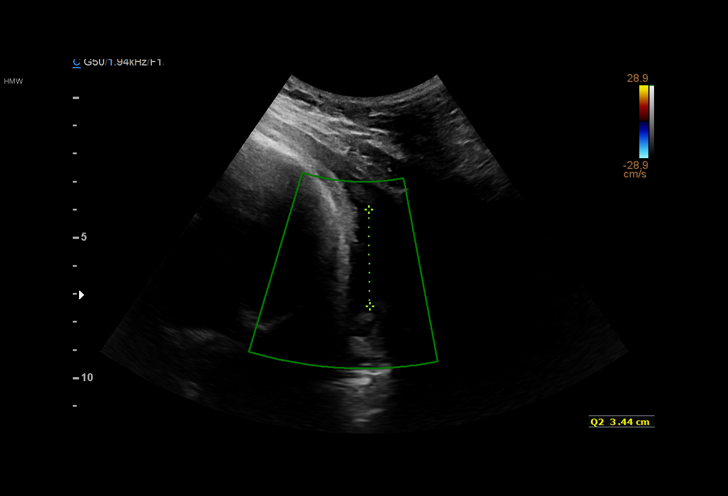
[im 4/23]
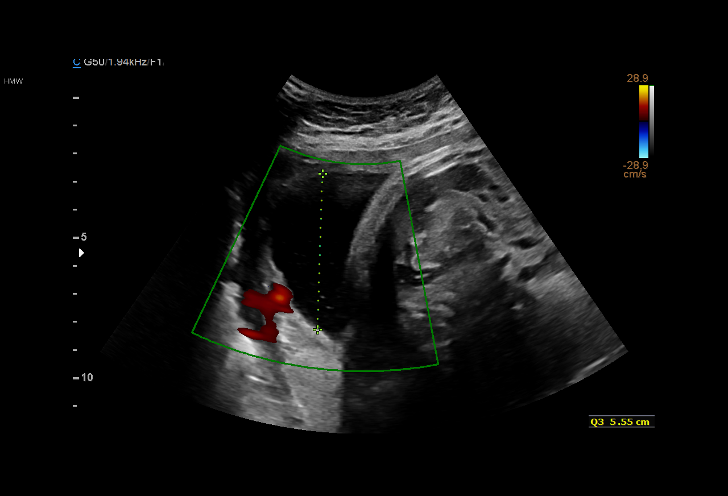
[im 6/23]
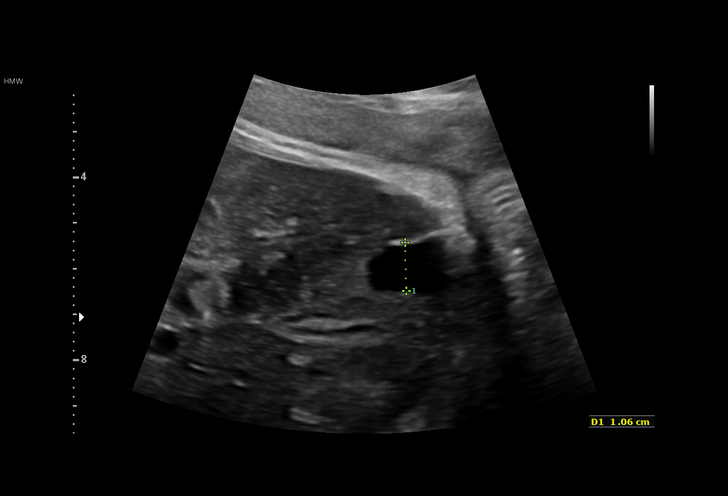
[im 7/23]
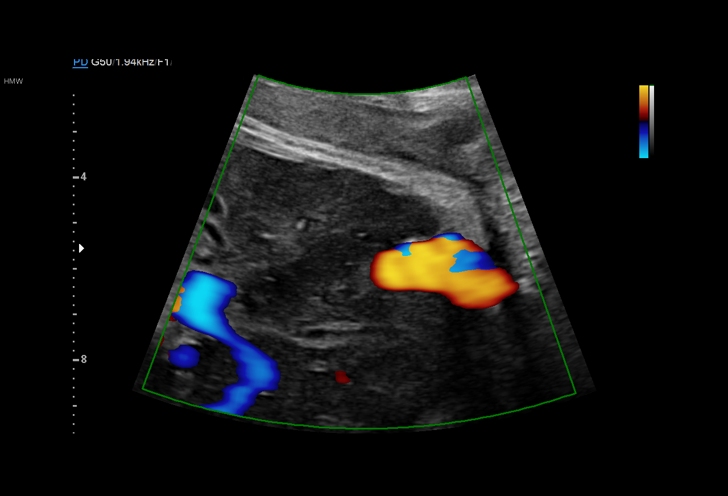
[im 9/23]
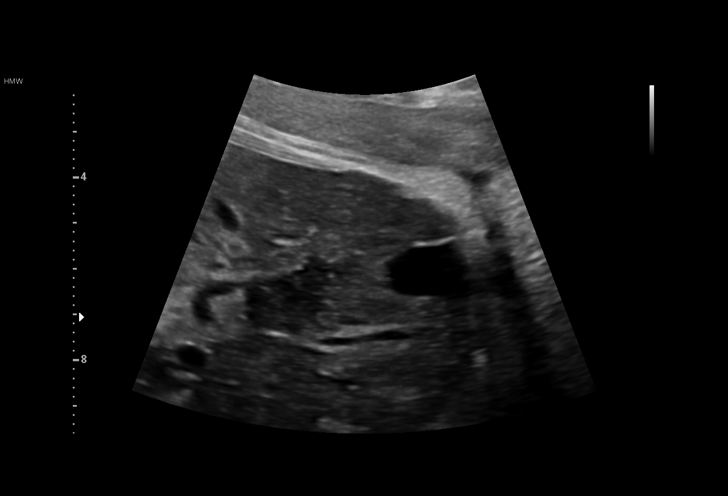
[im 10/23]
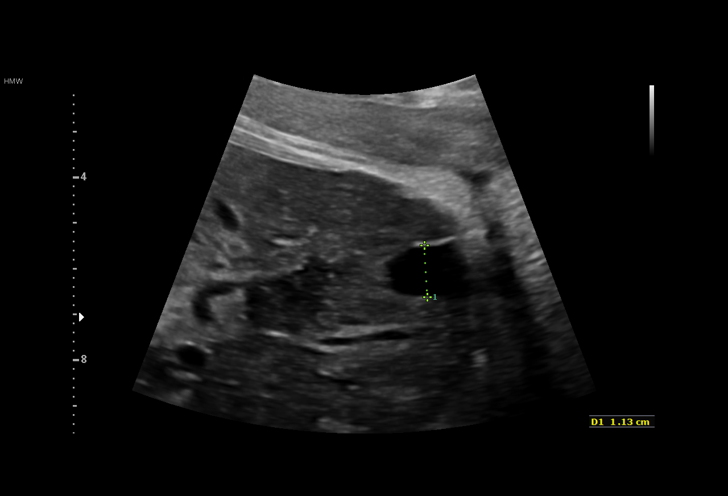
[im 12/23]
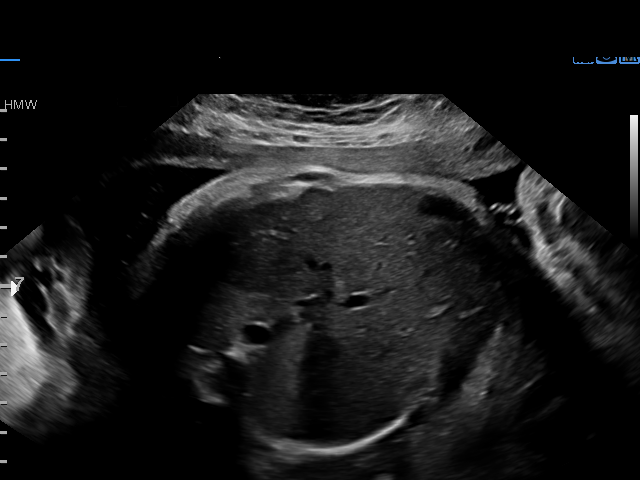
[im 14/23]
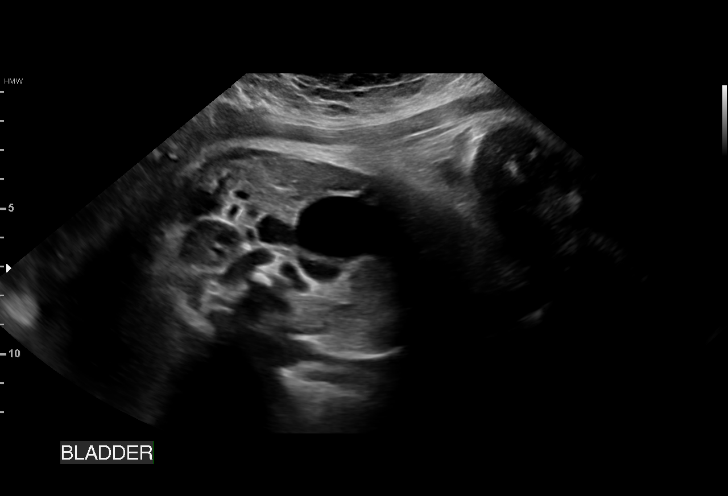
[im 15/23]
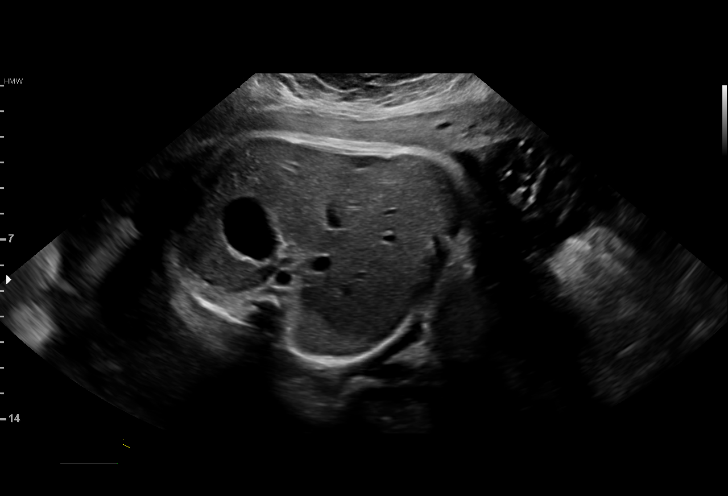
[im 17/23]
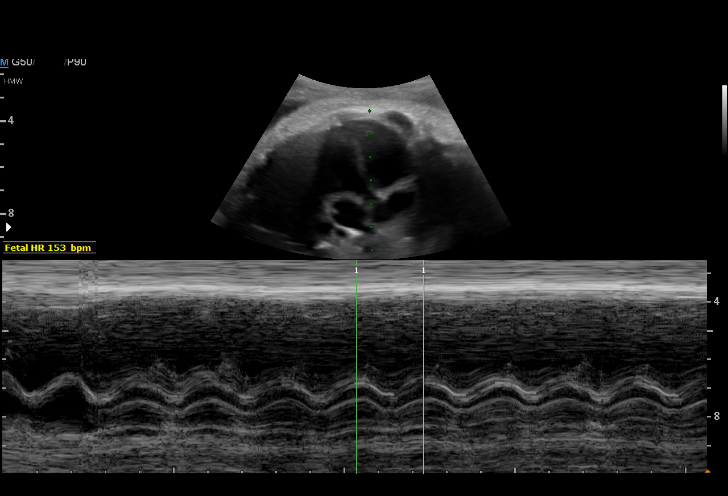
[im 18/23]
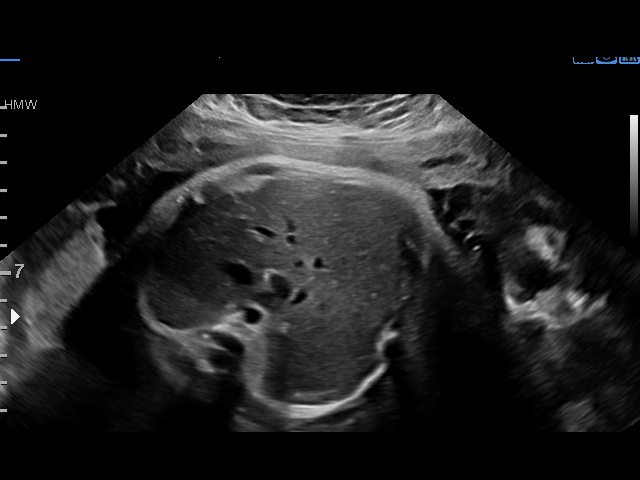
[im 20/23]
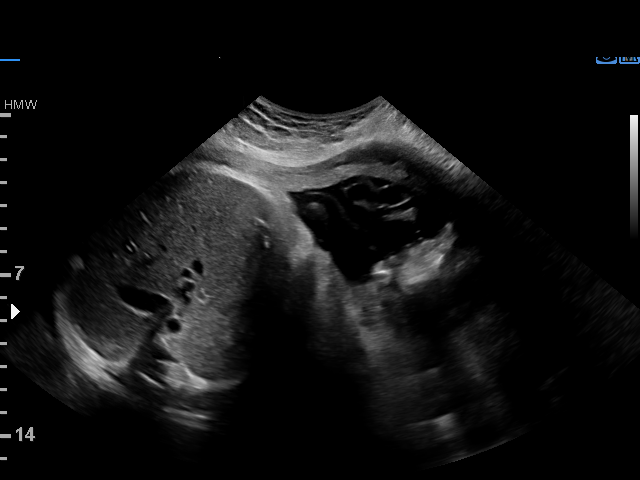
[im 21/23]
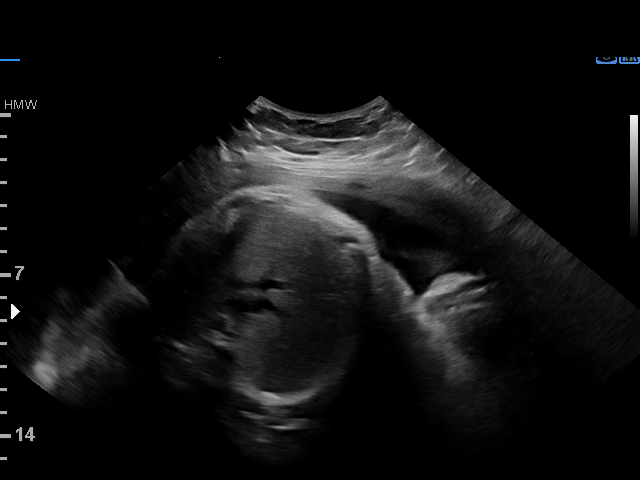
[im 23/23]
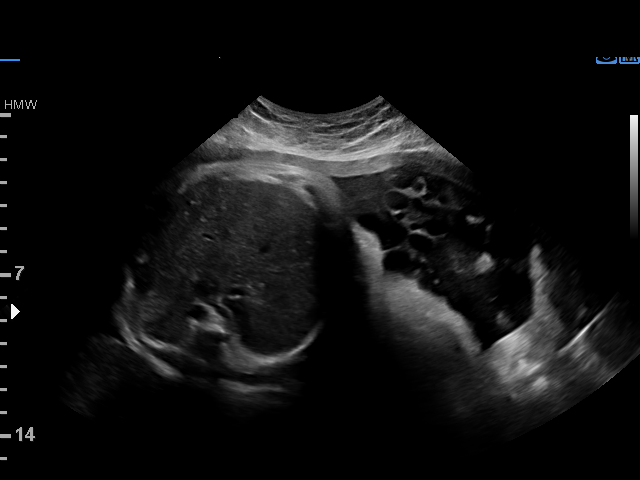

[15 of 23 positions shown; findings below may reference images not displayed]

FINDINGS: 1. Single intrauterine pregnancy with normal cardiac activity.
2. Posterior placenta without evidence of previa.
3. Normal amniotic fluid index.
4. Again seen is an umbilical vein varix measuring 11.3mm;
no filling defect is seen.
5. The remainder of the limited anatomy survey is normal.
6. Normal biophysical profile of [DATE].
Recommendations

1. Umbilical vein varix:
- previously counseled
- recommend fetal KARDENAS
- recommend delivery at 37 weeks or sooner if clinically
indicated
2. Diabetes:
- on glyburide
- normal fetal echocardiogram
- accelerated abdominal circumference on the previous
growth ultrasound- recommend caution in the second stage
3. Advanced maternal age:
- previously counseled
- low risk quad screen
- continue antenatal testing

## 2018-03-13 ENCOUNTER — Other Ambulatory Visit: Payer: Self-pay | Admitting: Obstetrics and Gynecology

## 2018-03-15 ENCOUNTER — Inpatient Hospital Stay (HOSPITAL_COMMUNITY): Payer: Medicaid Other | Admitting: Anesthesiology

## 2018-03-15 ENCOUNTER — Encounter (HOSPITAL_COMMUNITY): Payer: Self-pay

## 2018-03-15 ENCOUNTER — Encounter: Payer: Self-pay | Admitting: Obstetrics & Gynecology

## 2018-03-15 ENCOUNTER — Ambulatory Visit (HOSPITAL_COMMUNITY): Payer: Self-pay

## 2018-03-15 ENCOUNTER — Other Ambulatory Visit: Payer: Self-pay

## 2018-03-15 ENCOUNTER — Encounter (HOSPITAL_COMMUNITY)
Admission: RE | Disposition: A | Discharge status: Home / Self Care | Payer: Self-pay | Source: Ambulatory Visit | Attending: Obstetrics and Gynecology

## 2018-03-15 ENCOUNTER — Inpatient Hospital Stay (HOSPITAL_COMMUNITY)
Admission: RE | Admit: 2018-03-15 | Discharge: 2018-03-18 | DRG: 788 | Disposition: A | Discharge status: Home / Self Care | Payer: Medicaid Other | Source: Ambulatory Visit | Attending: Obstetrics and Gynecology | Admitting: Obstetrics and Gynecology

## 2018-03-15 VITALS — BP 100/42 | HR 73 | Temp 98.3°F | Resp 18 | Ht 63.0 in | Wt 203.0 lb

## 2018-03-15 DIAGNOSIS — O0990 Supervision of high risk pregnancy, unspecified, unspecified trimester: Secondary | ICD-10-CM

## 2018-03-15 DIAGNOSIS — O24425 Gestational diabetes mellitus in childbirth, controlled by oral hypoglycemic drugs: Principal | ICD-10-CM | POA: Diagnosis present

## 2018-03-15 DIAGNOSIS — O329XX0 Maternal care for malpresentation of fetus, unspecified, not applicable or unspecified: Secondary | ICD-10-CM

## 2018-03-15 DIAGNOSIS — O34211 Maternal care for low transverse scar from previous cesarean delivery: Secondary | ICD-10-CM | POA: Diagnosis present

## 2018-03-15 DIAGNOSIS — Z98891 History of uterine scar from previous surgery: Secondary | ICD-10-CM

## 2018-03-15 DIAGNOSIS — O358XX0 Maternal care for other (suspected) fetal abnormality and damage, not applicable or unspecified: Secondary | ICD-10-CM | POA: Diagnosis present

## 2018-03-15 DIAGNOSIS — O9081 Anemia of the puerperium: Secondary | ICD-10-CM | POA: Diagnosis not present

## 2018-03-15 DIAGNOSIS — O24919 Unspecified diabetes mellitus in pregnancy, unspecified trimester: Secondary | ICD-10-CM | POA: Diagnosis present

## 2018-03-15 DIAGNOSIS — O99891 Other specified diseases and conditions complicating pregnancy: Secondary | ICD-10-CM | POA: Diagnosis present

## 2018-03-15 DIAGNOSIS — O322XX0 Maternal care for transverse and oblique lie, not applicable or unspecified: Secondary | ICD-10-CM | POA: Diagnosis present

## 2018-03-15 DIAGNOSIS — E669 Obesity, unspecified: Secondary | ICD-10-CM | POA: Diagnosis present

## 2018-03-15 DIAGNOSIS — O09529 Supervision of elderly multigravida, unspecified trimester: Secondary | ICD-10-CM

## 2018-03-15 DIAGNOSIS — Z3A37 37 weeks gestation of pregnancy: Secondary | ICD-10-CM

## 2018-03-15 DIAGNOSIS — O99214 Obesity complicating childbirth: Secondary | ICD-10-CM | POA: Diagnosis present

## 2018-03-15 DIAGNOSIS — Z3009 Encounter for other general counseling and advice on contraception: Secondary | ICD-10-CM

## 2018-03-15 LAB — GLUCOSE, CAPILLARY
GLUCOSE-CAPILLARY: 71 mg/dL (ref 65–99)
GLUCOSE-CAPILLARY: 81 mg/dL (ref 65–99)
Glucose-Capillary: 131 mg/dL — ABNORMAL HIGH (ref 65–99)
Glucose-Capillary: 57 mg/dL — ABNORMAL LOW (ref 65–99)
Glucose-Capillary: 85 mg/dL (ref 65–99)

## 2018-03-15 LAB — CBC
HCT: 32.3 % — ABNORMAL LOW (ref 36.0–46.0)
Hemoglobin: 11 g/dL — ABNORMAL LOW (ref 12.0–15.0)
MCH: 28.7 pg (ref 26.0–34.0)
MCHC: 34.1 g/dL (ref 30.0–36.0)
MCV: 84.3 fL (ref 78.0–100.0)
PLATELETS: 335 10*3/uL (ref 150–400)
RBC: 3.83 MIL/uL — AB (ref 3.87–5.11)
RDW: 13.2 % (ref 11.5–15.5)
WBC: 4 10*3/uL (ref 4.0–10.5)

## 2018-03-15 LAB — TYPE AND SCREEN
ABO/RH(D): AB POS
ANTIBODY SCREEN: NEGATIVE

## 2018-03-15 LAB — ABO/RH: ABO/RH(D): AB POS

## 2018-03-15 LAB — RPR: RPR: NONREACTIVE

## 2018-03-15 SURGERY — Surgical Case
Anesthesia: Spinal

## 2018-03-15 MED ORDER — OXYTOCIN 10 UNIT/ML IJ SOLN
INTRAVENOUS | Status: DC | PRN
  Administered 2018-03-15: 40 [IU] via INTRAVENOUS

## 2018-03-15 MED ORDER — DIPHENHYDRAMINE HCL 50 MG/ML IJ SOLN: 12.5000 mg | INTRAMUSCULAR | Status: DC | PRN

## 2018-03-15 MED ORDER — LIDOCAINE HCL (PF) 1 % IJ SOLN: 30.0000 mL | INTRAMUSCULAR | Status: DC | PRN

## 2018-03-15 MED ORDER — EPHEDRINE 5 MG/ML INJ: 10.0000 mg | INTRAVENOUS | Status: DC | PRN

## 2018-03-15 MED ORDER — LACTATED RINGERS IV SOLN
INTRAVENOUS | Status: DC
  Administered 2018-03-15 (×3): via INTRAVENOUS

## 2018-03-15 MED ORDER — LACTATED RINGERS IV SOLN: 500.0000 mL | Freq: Once | INTRAVENOUS | Status: DC

## 2018-03-15 MED ORDER — CEFAZOLIN SODIUM-DEXTROSE 2-4 GM/100ML-% IV SOLN
2.0000 g | INTRAVENOUS | Status: AC
  Administered 2018-03-15: 2 g via INTRAVENOUS

## 2018-03-15 MED ORDER — PHENYLEPHRINE 40 MCG/ML (10ML) SYRINGE FOR IV PUSH (FOR BLOOD PRESSURE SUPPORT)
PREFILLED_SYRINGE | INTRAVENOUS | Status: DC | PRN
  Administered 2018-03-15 (×2): 80 ug via INTRAVENOUS
  Administered 2018-03-15: 40 ug via INTRAVENOUS
  Administered 2018-03-15 (×4): 80 ug via INTRAVENOUS
  Administered 2018-03-15: 40 ug via INTRAVENOUS
  Administered 2018-03-15 (×2): 80 ug via INTRAVENOUS

## 2018-03-15 MED ORDER — ACETAMINOPHEN 325 MG PO TABS: 650.0000 mg | ORAL_TABLET | ORAL | Status: DC | PRN

## 2018-03-15 MED ORDER — MORPHINE SULFATE (PF) 0.5 MG/ML IJ SOLN
INTRAMUSCULAR | Status: AC
  Filled 2018-03-15: qty 10

## 2018-03-15 MED ORDER — SODIUM CHLORIDE 0.9% FLUSH: 3.0000 mL | INTRAVENOUS | Status: DC | PRN

## 2018-03-15 MED ORDER — MEPERIDINE HCL 25 MG/ML IJ SOLN: 6.2500 mg | INTRAMUSCULAR | Status: DC | PRN

## 2018-03-15 MED ORDER — FENTANYL 2.5 MCG/ML BUPIVACAINE 1/10 % EPIDURAL INFUSION (WH - ANES): 14.0000 mL/h | INTRAMUSCULAR | Status: DC | PRN

## 2018-03-15 MED ORDER — PHENYLEPHRINE 8 MG IN D5W 100 ML (0.08MG/ML) PREMIX OPTIME
INJECTION | INTRAVENOUS | Status: DC | PRN
  Administered 2018-03-15: 60 ug/min via INTRAVENOUS

## 2018-03-15 MED ORDER — PHENYLEPHRINE 40 MCG/ML (10ML) SYRINGE FOR IV PUSH (FOR BLOOD PRESSURE SUPPORT): 80.0000 ug | PREFILLED_SYRINGE | INTRAVENOUS | Status: DC | PRN

## 2018-03-15 MED ORDER — OXYCODONE-ACETAMINOPHEN 5-325 MG PO TABS: 2.0000 | ORAL_TABLET | ORAL | Status: DC | PRN

## 2018-03-15 MED ORDER — FENTANYL CITRATE (PF) 100 MCG/2ML IJ SOLN
25.0000 ug | INTRAMUSCULAR | Status: DC | PRN
  Administered 2018-03-16 (×2): 50 ug via INTRAVENOUS

## 2018-03-15 MED ORDER — KETOROLAC TROMETHAMINE 30 MG/ML IJ SOLN: 30.0000 mg | Freq: Four times a day (QID) | INTRAMUSCULAR | Status: AC | PRN

## 2018-03-15 MED ORDER — ONDANSETRON HCL 4 MG/2ML IJ SOLN
INTRAMUSCULAR | Status: AC
  Filled 2018-03-15: qty 2

## 2018-03-15 MED ORDER — CEFAZOLIN SODIUM-DEXTROSE 2-4 GM/100ML-% IV SOLN
INTRAVENOUS | Status: AC
  Filled 2018-03-15: qty 100

## 2018-03-15 MED ORDER — ONDANSETRON HCL 4 MG/2ML IJ SOLN: 4.0000 mg | Freq: Three times a day (TID) | INTRAMUSCULAR | Status: DC | PRN

## 2018-03-15 MED ORDER — SODIUM CHLORIDE 0.9 % IR SOLN
Status: DC | PRN
  Administered 2018-03-15: 1

## 2018-03-15 MED ORDER — LACTATED RINGERS IV SOLN
INTRAVENOUS | Status: DC | PRN
  Administered 2018-03-15 (×4): via INTRAVENOUS

## 2018-03-15 MED ORDER — FENTANYL CITRATE (PF) 100 MCG/2ML IJ SOLN
100.0000 ug | INTRAMUSCULAR | Status: DC | PRN
  Administered 2018-03-15: 100 ug via INTRAVENOUS
  Filled 2018-03-15: qty 2

## 2018-03-15 MED ORDER — PHENYLEPHRINE 8 MG IN D5W 100 ML (0.08MG/ML) PREMIX OPTIME
INJECTION | INTRAVENOUS | Status: AC
  Filled 2018-03-15: qty 100

## 2018-03-15 MED ORDER — FENTANYL CITRATE (PF) 100 MCG/2ML IJ SOLN
INTRAMUSCULAR | Status: AC
  Filled 2018-03-15: qty 2

## 2018-03-15 MED ORDER — TERBUTALINE SULFATE 1 MG/ML IJ SOLN: 0.2500 mg | Freq: Once | INTRAMUSCULAR | Status: DC | PRN

## 2018-03-15 MED ORDER — SOD CITRATE-CITRIC ACID 500-334 MG/5ML PO SOLN: 30.0000 mL | ORAL | Status: DC

## 2018-03-15 MED ORDER — ONDANSETRON HCL 4 MG/2ML IJ SOLN: 4.0000 mg | Freq: Four times a day (QID) | INTRAMUSCULAR | Status: DC | PRN

## 2018-03-15 MED ORDER — ONDANSETRON HCL 4 MG/2ML IJ SOLN
INTRAMUSCULAR | Status: DC | PRN
  Administered 2018-03-15: 4 mg via INTRAVENOUS

## 2018-03-15 MED ORDER — LACTATED RINGERS IV SOLN: 500.0000 mL | INTRAVENOUS | Status: DC | PRN

## 2018-03-15 MED ORDER — BUPIVACAINE HCL (PF) 0.5 % IJ SOLN
INTRAMUSCULAR | Status: AC
  Filled 2018-03-15: qty 30

## 2018-03-15 MED ORDER — DEXAMETHASONE SODIUM PHOSPHATE 4 MG/ML IJ SOLN
INTRAMUSCULAR | Status: DC | PRN
  Administered 2018-03-15: 4 mg via INTRAVENOUS

## 2018-03-15 MED ORDER — NALOXONE HCL 0.4 MG/ML IJ SOLN: 0.4000 mg | INTRAMUSCULAR | Status: DC | PRN

## 2018-03-15 MED ORDER — OXYCODONE-ACETAMINOPHEN 5-325 MG PO TABS: 1.0000 | ORAL_TABLET | ORAL | Status: DC | PRN

## 2018-03-15 MED ORDER — SCOPOLAMINE 1 MG/3DAYS TD PT72
MEDICATED_PATCH | TRANSDERMAL | Status: DC | PRN
  Administered 2018-03-15: 1 via TRANSDERMAL

## 2018-03-15 MED ORDER — SCOPOLAMINE 1 MG/3DAYS TD PT72
MEDICATED_PATCH | TRANSDERMAL | Status: AC
  Filled 2018-03-15: qty 1

## 2018-03-15 MED ORDER — OXYTOCIN BOLUS FROM INFUSION: 500.0000 mL | Freq: Once | INTRAVENOUS | Status: DC

## 2018-03-15 MED ORDER — BUPIVACAINE HCL (PF) 0.5 % IJ SOLN
INTRAMUSCULAR | Status: DC | PRN
  Administered 2018-03-15: 30 mL

## 2018-03-15 MED ORDER — SODIUM CHLORIDE 0.9 % IV SOLN
500.0000 mg | INTRAVENOUS | Status: AC
  Administered 2018-03-15: 500 mg via INTRAVENOUS
  Filled 2018-03-15: qty 500

## 2018-03-15 MED ORDER — FENTANYL CITRATE (PF) 100 MCG/2ML IJ SOLN
INTRAMUSCULAR | Status: DC | PRN
  Administered 2018-03-15: 20 ug via INTRATHECAL

## 2018-03-15 MED ORDER — MORPHINE SULFATE (PF) 0.5 MG/ML IJ SOLN
INTRAMUSCULAR | Status: DC | PRN
  Administered 2018-03-15: .2 mg via INTRATHECAL

## 2018-03-15 MED ORDER — SOD CITRATE-CITRIC ACID 500-334 MG/5ML PO SOLN
30.0000 mL | ORAL | Status: DC | PRN
  Administered 2018-03-15: 30 mL via ORAL
  Filled 2018-03-15: qty 15

## 2018-03-15 MED ORDER — DEXAMETHASONE SODIUM PHOSPHATE 4 MG/ML IJ SOLN
INTRAMUSCULAR | Status: AC
  Filled 2018-03-15: qty 1

## 2018-03-15 MED ORDER — PHENYLEPHRINE 40 MCG/ML (10ML) SYRINGE FOR IV PUSH (FOR BLOOD PRESSURE SUPPORT)
PREFILLED_SYRINGE | INTRAVENOUS | Status: AC
  Filled 2018-03-15: qty 20

## 2018-03-15 MED ORDER — BUPIVACAINE IN DEXTROSE 0.75-8.25 % IT SOLN
INTRATHECAL | Status: DC | PRN
  Administered 2018-03-15: 11.5 mg via INTRATHECAL

## 2018-03-15 MED ORDER — PHENYLEPHRINE 40 MCG/ML (10ML) SYRINGE FOR IV PUSH (FOR BLOOD PRESSURE SUPPORT)
PREFILLED_SYRINGE | INTRAVENOUS | Status: AC
  Filled 2018-03-15: qty 10

## 2018-03-15 MED ORDER — OXYTOCIN 10 UNIT/ML IJ SOLN
INTRAMUSCULAR | Status: AC
  Filled 2018-03-15: qty 4

## 2018-03-15 MED ORDER — OXYTOCIN 40 UNITS IN LACTATED RINGERS INFUSION - SIMPLE MED
1.0000 m[IU]/min | INTRAVENOUS | Status: DC
  Administered 2018-03-15: 1 m[IU]/min via INTRAVENOUS
  Filled 2018-03-15: qty 1000

## 2018-03-15 MED ORDER — OXYTOCIN 40 UNITS IN LACTATED RINGERS INFUSION - SIMPLE MED: 2.5000 [IU]/h | INTRAVENOUS | Status: DC

## 2018-03-15 MED ORDER — OXYTOCIN 40 UNITS IN LACTATED RINGERS INFUSION - SIMPLE MED: 1.0000 m[IU]/min | INTRAVENOUS | Status: DC

## 2018-03-15 SURGICAL SUPPLY — 39 items
APL SKNCLS STERI-STRIP NONHPOA (GAUZE/BANDAGES/DRESSINGS)
BENZOIN TINCTURE PRP APPL 2/3 (GAUZE/BANDAGES/DRESSINGS) IMPLANT
CHLORAPREP W/TINT 26ML (MISCELLANEOUS) ×3 IMPLANT
CLAMP CORD UMBIL (MISCELLANEOUS) IMPLANT
CLOSURE STERI STRIP 1/2 X4 (GAUZE/BANDAGES/DRESSINGS) ×2 IMPLANT
CLOTH BEACON ORANGE TIMEOUT ST (SAFETY) ×3 IMPLANT
DRSG OPSITE POSTOP 4X10 (GAUZE/BANDAGES/DRESSINGS) ×3 IMPLANT
ELECT REM PT RETURN 9FT ADLT (ELECTROSURGICAL) ×3
ELECTRODE REM PT RTRN 9FT ADLT (ELECTROSURGICAL) ×1 IMPLANT
EXTRACTOR VACUUM BELL STYLE (SUCTIONS) IMPLANT
GLOVE BIOGEL PI IND STRL 6.5 (GLOVE) ×1 IMPLANT
GLOVE BIOGEL PI IND STRL 7.0 (GLOVE) ×2 IMPLANT
GLOVE BIOGEL PI INDICATOR 6.5 (GLOVE) ×2
GLOVE BIOGEL PI INDICATOR 7.0 (GLOVE) ×4
GLOVE ORTHOPEDIC STR SZ6.5 (GLOVE) ×3 IMPLANT
GOWN STRL REUS W/TWL LRG LVL3 (GOWN DISPOSABLE) ×9 IMPLANT
HEMOSTAT SURGICEL 2X14 (HEMOSTASIS) ×2 IMPLANT
KIT ABG SYR 3ML LUER SLIP (SYRINGE) IMPLANT
NDL HYPO 25X1 1.5 SAFETY (NEEDLE) IMPLANT
NEEDLE HYPO 22GX1.5 SAFETY (NEEDLE) ×3 IMPLANT
NEEDLE HYPO 25X1 1.5 SAFETY (NEEDLE) IMPLANT
NS IRRIG 1000ML POUR BTL (IV SOLUTION) ×3 IMPLANT
PACK C SECTION WH (CUSTOM PROCEDURE TRAY) ×3 IMPLANT
PAD OB MATERNITY 4.3X12.25 (PERSONAL CARE ITEMS) ×3 IMPLANT
PENCIL SMOKE EVAC W/HOLSTER (ELECTROSURGICAL) ×3 IMPLANT
RETRACTOR TRAXI PANNICULUS (MISCELLANEOUS) IMPLANT
SPONGE LAP 18X18 RF (DISPOSABLE) ×4 IMPLANT
STRIP CLOSURE SKIN 1/2X4 (GAUZE/BANDAGES/DRESSINGS) ×1 IMPLANT
SUT MON AB 4-0 PS1 27 (SUTURE) ×3 IMPLANT
SUT PLAIN 2 0 (SUTURE) ×6
SUT PLAIN ABS 2-0 CT1 27XMFL (SUTURE) ×1 IMPLANT
SUT VIC AB 0 CT1 36 (SUTURE) ×10 IMPLANT
SUT VIC AB 0 CTX 36 (SUTURE) ×3
SUT VIC AB 0 CTX36XBRD ANBCTRL (SUTURE) ×1 IMPLANT
SYR BULB 3OZ (MISCELLANEOUS) ×2 IMPLANT
SYR CONTROL 10ML LL (SYRINGE) ×3 IMPLANT
TOWEL OR 17X24 6PK STRL BLUE (TOWEL DISPOSABLE) ×3 IMPLANT
TRAXI PANNICULUS RETRACTOR (MISCELLANEOUS) ×2
TRAY FOLEY BAG SILVER LF 14FR (SET/KITS/TRAYS/PACK) ×3 IMPLANT

## 2018-03-15 NOTE — Progress Notes (Signed)
Hypoglycemic Event  CBG: 57  Treatment: 15 GM carbohydrate snack  Symptoms: None  Follow-up CBG: Time 1320 CBG Result 85  Possible Reasons for Event: Inadequate meal intake  Comments/MD notified:Rolitta Arita Missawson, CNM in room at time of result made aware    Avionna Bower, Betha LoaJennifer Burgess

## 2018-03-15 NOTE — Consult Note (Signed)
Neonatology Note:   Attendance at C-section:    I was asked by Dr. Davis to attend this C/S at term ([redacted] wks EGA) due to fetal malposition. The mother is a GG6P4014, GBS negative with good prenatal care. Pregnancy complicated by failed IOL, AMA, and GDM on Glyburide. ROM 0 hours before delivery, fluid clear. Transverse lie with difficult extraction.  Infant vigorous with good spontaneous cry and tone. +60sec DCC.  Needed only minimal bulb suctioning. Ap 8/9. Lungs clear to ausc in DR. Appropriate neuro exam for GA.  Clavicles intact and infant moving all 4 extremities equally with FROM.  Mother updated via interpretor.  To CN to care of Pediatrician.  Samvel Zinn C. Letisia Schwalb, MD 

## 2018-03-15 NOTE — Transfer of Care (Signed)
Immediate Anesthesia Transfer of Care Note  Patient: Carolyn Mendez  Procedure(s) Performed: CESAREAN SECTION (N/A )  Patient Location: PACU  Anesthesia Type:Spinal  Level of Consciousness: awake, alert  and oriented  Airway & Oxygen Therapy: Patient Spontanous Breathing  Post-op Assessment: Report given to RN and Post -op Vital signs reviewed and stable  Post vital signs: Reviewed and stable  Last Vitals:  Vitals Value Taken Time  BP 110/98 03/15/2018 11:52 PM  Temp    Pulse 96 03/15/2018 11:53 PM  Resp 19 03/15/2018 11:53 PM  SpO2 100 % 03/15/2018 11:53 PM  Vitals shown include unvalidated device data.  Last Pain:  Vitals:   03/15/18 2140  TempSrc: Oral  PainSc:          Complications: No apparent anesthesia complications

## 2018-03-15 NOTE — Progress Notes (Signed)
Subjective: Carolyn Mendez is a 42 y.o. [id number] at 5664w0d by LMP admitted for induction of labor due to UVV, GDMA2 (glyburide), TOLAC (SVD, CS, VBAC x 2), and AMA 3(41 yo).  Objective: BP (!) 113/46   Pulse 73   Temp 98.1 F (36.7 C) (Oral)   Resp 20   Ht 5\' 3"  (1.6 m)   Wt 203 lb (92.1 kg)   LMP 06/29/2017 (Exact Date)   BMI 35.96 kg/m   FHT:  FHR: 125 bpm, variability: moderate,  accelerations:  Present,  decelerations:  Absent UC:   regular, every 2-3 minutes SVE:   Dilation: 2 Effacement (%): 60 Station: -3 Exam by:: Carolyn Mendez. Carolyn Mendez, CNM Cervical balloon placed successfully -- 60 ml of water instilled in balloon / pt tolerated well Pitocin 20 milli-units  Labs: Lab Results  Component Value Date   WBC 4.0 03/15/2018   HGB 11.0 (L) 03/15/2018   HCT 32.3 (L) 03/15/2018   MCV 84.3 03/15/2018   PLT 335 03/15/2018    Assessment / Plan: Induction of labor due to gestational diabetes and UVV, TOLAC and AMA,  progressing well on pitocin  Labor: Progressing on Pitocin, will not increase until next VE; then AROM Preeclampsia:  n/a Fetal Wellbeing:  Category I Pain Control:  Labor support without medications I/D:  n/a Anticipated MOD:  NSVD  Carolyn Moraolitta Kimisha Eunice, MSN, CNM 03/15/2018, 5:15 PM  *Exam and procedure done with assistance from [employer] Alvarez Spanish Interpreter

## 2018-03-15 NOTE — Anesthesia Procedure Notes (Signed)
Spinal  Patient location during procedure: OR Start time: 03/15/2018 10:00 PM Staffing Anesthesiologist: Mal AmabileFoster, Sheridan Gettel, MD Performed: anesthesiologist  Preanesthetic Checklist Completed: patient identified, site marked, surgical consent, pre-op evaluation, timeout performed, IV checked, risks and benefits discussed and monitors and equipment checked Spinal Block Patient position: sitting Prep: site prepped and draped and DuraPrep Patient monitoring: heart rate, cardiac monitor, continuous pulse ox and blood pressure Approach: midline Location: L3-4 Injection technique: single-shot Needle Needle type: Pencan  Needle gauge: 24 G Needle length: 9 cm Needle insertion depth: 6 cm Assessment Sensory level: T4 Additional Notes Patient tolerated procedure well. Adequate sensory level.

## 2018-03-15 NOTE — Progress Notes (Signed)
Orlan LeavensViria Alvarez, Spanish Interpreter, was present in the operating room during the cesarean section.

## 2018-03-15 NOTE — H&P (Addendum)
Carolyn Mendez is a 42 y.o. female [phone] with IUP at 5236w0d presenting for IOL for UVV, GMDA2 (glyburide), TOLAC (SVD, CS, VBAC x 2), and AMA 8(41 yo). Pt states she has been having active FM today. She reports her FBS was 87 mg/dL this AM and she did eat breakfast this AM.  Last EFW @ 34 wks was 89% (6lbs 5oz); largest baby was VBAC at 9 lbs.  Prenatal Course Source of Care: [facility] with onset of care at 17.4 weeks Pregnancy complications or risk: Patient Active Problem List   Diagnosis Date Noted  . Umbilical vein abnormality affecting pregnancy 03/15/2018  . 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 Language barrier 02/16/2018  . Unwanted fertility 11/27/2017  . Supervision of high risk pregnancy, antepartum 10/30/2017  . Diabetes mellitus complicating pregnancy, antepartum 10/30/2017  . History of VBAC 10/30/2017  . AMA (advanced maternal age) multigravida 35+ 10/30/2017  . Irritable bowel disease 02/25/2016  . Depression 09/24/2013  . OBESITY 01/15/2009   Contraception:  She desires to use oral progesterone-only contraceptive.  Feeding Method:  She plans to plans to breastfeed  Prenatal labs and studies: ABO, Rh:  AB Positive Antibody:  Negative Rubella: Immune (10/11 0000) RPR: Non Reactive (01/21 1705)  HBsAg: Negative (10/11 0000)  HIV: Non Reactive (01/21 1705)  GBS: Negative (03/15 0000)  1 hr Glucola: abnormal; failed 3 hr GTT Genetic screening: normal  Anatomy US normal  Past Medical History:  Past Medical History:  Diagnosis Date  . Diabetes mellitus without complication (HCC)   . GERD (gastroesophageal reflux disease)     Past Surgical History:  Past Surgical History:  Procedure Laterality Date  . CESAREAN SECTION     VBAC x2    Obstetrical History:  OB History    Gravida  6   Para  4   Term  4   Preterm  0   AB  1   Living  4     SAB      TAB      Ectopic  1   Multiple  0   Live Births  4            Social History:  Social History   Socioeconomic History  . Marital status: Single    Spouse name: Not on file  . Number of children: Not on file  . Years of education: Not on file  . Highest education level: Not on file  Occupational History  . Not on file  Social Needs  . Financial resource strain: Not on file  . Food insecurity:    Worry: Not on file    Inability: Not on file  . Transportation needs:    Medical: Not on file    Non-medical: Not on file  Tobacco Use  . Smoking status: Never Smoker  . Smokeless tobacco: Never Used  Substance and Sexual Activity  . Alcohol use: No  . Drug use: No  . Sexual activity: Yes    Birth control/protection: None  Lifestyle  . Physical activity:    Days per week: Not on file    Minutes per session: Not on file  . Stress: Not on file  Relationships  . Social connections:    Talks on phone: Not on file    Gets together: Not on file    Attends religious service: Not on file    Active member of club or organization: Not on file    Attends meetings  of clubs or organizations: Not on file    Relationship status: Not on file  Other Topics Concern  . Not on file  Social History Narrative  . Not on file    Family History:  Family History  Problem Relation Age of Onset  . Hyperlipidemia Mother   . Kidney disease Mother   . Hyperlipidemia Sister   . Osteoporosis Sister     Medications:  Prenatal vitamins,  Current Facility-Administered Medications  Medication Dose Route Frequency Provider Last Rate Last Dose  . acetaminophen (TYLENOL) tablet 650 mg  650 mg Oral Q4H PRN Degele, Kandra Nicolas, MD      . diphenhydrAMINE (BENADRYL) injection 12.5 mg  12.5 mg Intravenous Q15 min PRN Shelton Silvas, MD      . ePHEDrine injection 10 mg  10 mg Intravenous PRN Shelton Silvas, MD      . ePHEDrine injection 10 mg  10 mg Intravenous PRN Shelton Silvas, MD      . fentaNYL (SUBLIMAZE) injection 100 mcg  100 mcg Intravenous Q1H  PRN Degele, Kandra Nicolas, MD      . fentaNYL 2.5 mcg/ml w/bupivacaine 0.1% in NS epidural infusion (WH-ANES)  14 mL/hr Epidural Continuous PRN Shelton Silvas, MD      . lactated ringers infusion 500 mL  500 mL Intravenous Once Shelton Silvas, MD      . lactated ringers infusion 500-1,000 mL  500-1,000 mL Intravenous PRN Degele, Kandra Nicolas, MD      . lactated ringers infusion   Intravenous Continuous Degele, Kandra Nicolas, MD 125 mL/hr at 03/15/18 2130    . lidocaine (PF) (XYLOCAINE) 1 % injection 30 mL  30 mL Subcutaneous PRN Degele, Kandra Nicolas, MD      . ondansetron (ZOFRAN) injection 4 mg  4 mg Intravenous Q6H PRN Degele, Kandra Nicolas, MD      . oxyCODONE-acetaminophen (PERCOCET/ROXICET) 5-325 MG per tablet 1 tablet  1 tablet Oral Q4H PRN Degele, Kandra Nicolas, MD      . oxyCODONE-acetaminophen (PERCOCET/ROXICET) 5-325 MG per tablet 2 tablet  2 tablet Oral Q4H PRN Degele, Kandra Nicolas, MD      . oxytocin (PITOCIN) IV BOLUS FROM BAG  500 mL Intravenous Once Degele, Kandra Nicolas, MD      . oxytocin (PITOCIN) IV infusion 40 units in LR 1000 mL - Premix  2.5 Units/hr Intravenous Continuous Degele, Kandra Nicolas, MD      . oxytocin (PITOCIN) IV infusion 40 units in LR 1000 mL - Premix  1-40 milli-units/min Intravenous Titrated Raelyn Mora, CNM 6 mL/hr at 03/15/18 0935 4 milli-units/min at 03/15/18 0935  . PHENYLephrine 40 mcg/ml in normal saline Adult IV Push Syringe  80 mcg Intravenous PRN Shelton Silvas, MD      . PHENYLephrine 40 mcg/ml in normal saline Adult IV Push Syringe  80 mcg Intravenous PRN Shelton Silvas, MD      . sodium citrate-citric acid (ORACIT) solution 30 mL  30 mL Oral Q2H PRN Degele, Kandra Nicolas, MD      . terbutaline (BRETHINE) injection 0.25 mg  0.25 mg Subcutaneous Once PRN Degele, Kandra Nicolas, MD        Allergies: No Known Allergies  Review of Systems: - History obtained from the patient and using in-house Spanish Interpreter General ROS: negative Ophthalmic ROS: negative ENT ROS: negative Endocrine ROS:  negative Breast ROS: negative for breast lumps Cardiovascular ROS: no chest pain or dyspnea on exertion Gastrointestinal ROS: no abdominal pain,  change in bowel habits, or black or bloody stools Genito-Urinary ROS: no dysuria, trouble voiding, or hematuria Musculoskeletal ROS: negative Neurological ROS: negative Dermatological ROS: negative  Physical Exam: Blood pressure 114/60, pulse 86, temperature 97.9 F (36.6 C), temperature source Oral, height 5\' 3"  (1.6 m), weight 203 lb (92.1 kg), last menstrual period 06/29/2017. General appearance: alert, cooperative and no distress Head: Normocephalic, without obvious abnormality, atraumatic Eyes: conjunctivae/corneas clear. PERRL, EOM's intact. Fundi benign. Ears: normal TM's and external ear canals both ears Nose: Nares normal. Septum midline. Mucosa normal. No drainage or sinus tenderness. Throat: lips, mucosa, and tongue normal; teeth and gums normal Neck: no adenopathy, no carotid bruit, no JVD, supple, symmetrical, trachea midline and thyroid not enlarged, symmetric, no tenderness/mass/nodules Back: symmetric, no curvature. ROM normal. No CVA tenderness. Lungs: clear to auscultation bilaterally Breasts: normal appearance, no masses or tenderness Heart: regular rate and rhythm, S1, S2 normal, no murmur, click, rub or gallop Abdomen: soft, non-tender; bowel sounds normal; no masses,  no organomegaly Extremities: extremities normal, atraumatic, no cyanosis or edema Skin: Skin color, texture, turgor normal. No rashes or lesions Neurologic: Alert and oriented X 3, normal strength and tone. Normal symmetric reflexes. Normal coordination and gait cephalic Baseline: 130 bpm, Variability: Good {> 6 bpm), Accelerations: Reactive and Decelerations: Absent Occ UC's with UI noted Dilation: Closed Effacement (%): Thick Station: -3 Exam by:: J.Cox, RN  Pertinent Labs/Studies:   Results for orders placed or performed during the hospital encounter  of 03/15/18 (from the past 24 hour(s))  CBC     Status: Abnormal   Collection Time: 03/15/18  7:15 AM  Result Value Ref Range   WBC 4.0 4.0 - 10.5 K/uL   RBC 3.83 (L) 3.87 - 5.11 MIL/uL   Hemoglobin 11.0 (L) 12.0 - 15.0 g/dL   HCT 16.1 (L) 09.6 - 04.5 %   MCV 84.3 78.0 - 100.0 fL   MCH 28.7 26.0 - 34.0 pg   MCHC 34.1 30.0 - 36.0 g/dL   RDW 40.9 81.1 - 91.4 %   Platelets 335 150 - 400 K/uL  Glucose, capillary     Status: Abnormal   Collection Time: 03/15/18  8:55 AM  Result Value Ref Range   Glucose-Capillary 131 (H) 65 - 99 mg/dL     Assessment : Carolyn Mendez is a 42 y.o. female presenting for IOL  IUP @ 37.0 weeks  Plan: Admit to [employer] Routine L&D orders Analgesia:  IV pain meds Anticipate VBAC  *H&P and discussion of POC with patient and family with assistance from [employer], Spanish Interpreter  Raelyn Mora, MSN, CNM 03/15/2018, 9:25 AM

## 2018-03-15 NOTE — Anesthesia Preprocedure Evaluation (Signed)
Anesthesia Evaluation  Patient identified by MRN, date of birth, ID band Patient awake    Reviewed: Allergy & Precautions, NPO status , Patient's Chart, lab work & pertinent test results  Airway Mallampati: III  TM Distance: >3 FB Neck ROM: Full    Dental no notable dental hx. (+) Teeth Intact, Caps   Pulmonary neg pulmonary ROS,    Pulmonary exam normal breath sounds clear to auscultation       Cardiovascular negative cardio ROS Normal cardiovascular exam Rhythm:Regular Rate:Normal     Neuro/Psych PSYCHIATRIC DISORDERS Depression negative neurological ROS     GI/Hepatic Neg liver ROS, GERD  Medicated and Controlled,  Endo/Other  diabetes, Well Controlled, Gestational, Oral Hypoglycemic AgentsObesity  Renal/GU negative Renal ROS  negative genitourinary   Musculoskeletal   Abdominal (+) + obese,   Peds  Hematology  (+) anemia ,   Anesthesia Other Findings   Reproductive/Obstetrics (+) Pregnancy Previous C/Section with hx/o successful VBAC Breech Presentation AMA                             Anesthesia Physical Anesthesia Plan  ASA: II and emergent  Anesthesia Plan: Spinal   Post-op Pain Management:    Induction:   PONV Risk Score and Plan: 4 or greater and Ondansetron, Dexamethasone, Scopolamine patch - Pre-op and Treatment may vary due to age or medical condition  Airway Management Planned: Natural Airway  Additional Equipment:   Intra-op Plan:   Post-operative Plan:   Informed Consent: I have reviewed the patients History and Physical, chart, labs and discussed the procedure including the risks, benefits and alternatives for the proposed anesthesia with the patient or authorized representative who has indicated his/her understanding and acceptance.   Dental advisory given  Plan Discussed with: CRNA, Anesthesiologist and Surgeon  Anesthesia Plan Comments:          Anesthesia Quick Evaluation

## 2018-03-15 NOTE — Op Note (Addendum)
Cesarean Section Operative Report  PATIENT: Carolyn Mendez  PROCEDURE DATE: 03/15/2018  PREOPERATIVE DIAGNOSES: Intrauterine pregnancy at 8660w0d weeks gestation; fetal malpresentation; history of prior low transverse Cesarean Section  POSTOPERATIVE DIAGNOSES: The same; transverse fetal presentation   PROCEDURE: Classical Cesarean Section with T-incision on uterus PATIENT NOT A CANDIDATE FOR TOLAC  SURGEON:   Surgeon(s) and Role:    * Conan Bowensavis, Arien Benincasa M, MD - Primary    * Degele, Kandra NicolasJulie P, MD - OB Fellow   INDICATIONS: Carolyn Mendez is a 42 y.o. [id number] at 1860w0d here for cesarean section secondary to the indications listed under preoperative diagnoses; please see preoperative note for further details.  The risks of cesarean section were discussed with the patient including but were not limited to: bleeding which may require transfusion or reoperation; infection which may require antibiotics; injury to bowel, bladder, ureters or other surrounding organs; injury to the fetus; need for additional procedures including hysterectomy in the event of a life-threatening hemorrhage; placental abnormalities wth subsequent pregnancies, incisional problems, thromboembolic phenomenon and other postoperative/anesthesia complications.   The patient concurred with the proposed plan, giving informed written consent for the procedure.    FINDINGS:  Viable female infant in transverse presentation (dorsoinferior), head to maternal left.  Apgars 8 and 9.  Clear amniotic fluid.  Intact placenta, three vessel cord.  Normal uterus, fallopian tubes and ovaries bilaterally.  ANESTHESIA: Spinal INTRAVENOUS FLUIDS: 2,800 ml ESTIMATED BLOOD LOSS: 1921 mL URINE OUTPUT:  200 ml SPECIMENS: Placenta sent to pathology COMPLICATIONS: Transverse dorsoinferior fetal lie with difficulty delivery and low transvere hysterotomy with T incision on uterus  PROCEDURE IN DETAIL:  The patient preoperatively received intravenous  antibiotics and had sequential compression devices applied to her lower extremities.  She was then taken to the operating room where spinal anesthesia was administered and was found to be adequate. She was then placed in a dorsal supine position with a leftward tilt, and prepped and draped in a sterile manner.  A foley catheter was placed into her bladder and attached to constant gravity.    After an adequate timeout was performed, a Pfannenstiel skin incision was made with scalpel over her preexisting scar and carried through to the underlying layer of fascia. The fascia was incised in the midline, and this incision was extended bilaterally using the Mayo scissors.  Kocher clamps were applied to the superior aspect of the fascial incision and the underlying rectus muscles were dissected off bluntly.  A similar process was carried out on the inferior aspect of the fascial incision. The rectus muscles were separated in the midline bluntly and the peritoneum was entered bluntly. Attention was turned to the lower uterine segment where a low transverse hysterotomy was made with a scalpel and extended bilaterally with bandage scissors.  The infant was found to be in transerse dorsoinferior lie with left arm and shoulder as presenting part, and was unable to be successfully turned for delivery. The hysterotomy was then extended vertically superiorly in a T incision, and neonate buttocks were grasped and delivered through the hysterotomy. Infant was then delivered easily from breech position, vigorous from delivery. The cord was clamped and cut after one minute, and the infant was handed over to the awaiting neonatology team. Uterine massage was then administered, and the placenta was slightly adherent to the posterior uterus causing a slight inversion with delivery (intact with a three-vessel cord) which was easily replaced. The uterus was gently exteriorized and cleared of clots and debris.  The vertical incision  was  closed with 0 Vicryl in a running locked fashion in two layers. The low transverse incision was closed in running locked fashion in two layers. The uterus was gently replaced within the abdomen and minor oozing noted at right hysterotomy which improved with pressure. Surgicel was used for additional hemostasis over lower uterine incision. The pelvis was cleared of all clot and debris. Hemostasis was confirmed on all surfaces.  The fascia was then closed using 0 Vicryl.  The subcutaneous layer was irrigated, then reapproximated with 2-0 plain gut running stitch in two layers, and 30 ml of 0.5% Marcaine was injected subcutaneously around the incision.  The skin was closed with a 4-0 Monocryl stitch.   The patient tolerated the procedure well. Sponge, lap, instrument and needle counts were correct x 3.  She was taken to the recovery room in stable condition.   Maternal Disposition: PACU - hemodynamically stable.   Fetal disposition: skin-to-skin with mother   Signed: Frederik Pear, MD OB Fellow 03/15/2018 11:50 PM    Attestation of Attending Supervision of OB Fellow: Evaluation and management procedures were performed by the [facility] Fellow under my supervision and collaboration.  I have reviewed the OB Fellow's note and chart, and I agree with the management and plan.  Patient with low transverse hysterotomy and vertical T incision on uterus secondary to transverse back down position of infant and difficult delivery. She is NOT a candidate for a TOLAC.   K. Therese Sarah, M.D. Attending Obstetrician & Gynecologist, [facility] for [employer], [facility]  03/16/2018 7:30 AM

## 2018-03-15 NOTE — Anesthesia Pain Management Evaluation Note (Signed)
  CRNA Pain Management Visit Note  Patient: Carolyn Mendez, 42 y.o., female  "Hello I am a member of the anesthesia team at [facility]. We have an anesthesia team available at all times to provide care throughout the hospital, including epidural management and anesthesia for C-section. I don't know your plan for the delivery whether it a natural birth, water birth, IV sedation, nitrous supplementation, doula or epidural, but we want to meet your pain goals."   1.Was your pain managed to your expectations on prior hospitalizations?   Yes   2.What is your expectation for pain management during this hospitalization?     IV pain meds  3.How can we help you reach that goal? Nursing support and IV pain meds  Record the patient's initial score and the patient's pain goal.   Pain: 0/10  Pain Goal: 5/10 [facility] wants you to be able to say your pain was always managed very well.  Salome ArntSterling, Danashia Landers Marie 03/15/2018

## 2018-03-15 NOTE — Progress Notes (Signed)
Faculty Note  Called in to confirm position by US. Patient found to be breech position during IOL for umbilical vein varix. Reviewed risks of ECV versus proceeding with repeat c-section with patient, recommended proceeding with repeat c-section. The risks of cesarean section were discussed with the patient; including but not limited to: infection which may require antibiotics; bleeding which may require transfusion or re-operation; injury to bowel, bladder, ureters or other surrounding organs; need for additional procedures including hysterectomy in the event of a life-threatening hemorrhage; placental abnormalities wth subsequent pregnancies, incisional problems, thromboembolic phenomenon and other postoperative/anesthesia complications. Answered all questions. The patient verbalized understanding of the plan, giving informed consent for the procedure.   To OR when ready  Interview done with spanish interpretor   Baldemar LenisK. Meryl Davis, M.D. Attending Obstetrician & Gynecologist, [facility], [facility]

## 2018-03-15 NOTE — Progress Notes (Addendum)
Subjective: Carolyn Mendez is a 42 y.o. [id number] at [redacted]w[redacted]d by LMP admitted for induction of labor due to UVV, GDMA2 (glyburide), TOLAC (SVD, CS, VBAC x 2), and AMA (42 yo).  Objective: BP (!) 114/58   Pulse 69   Temp 97.8 F (36.6 C) (Oral)   Resp 18   Ht  (1.6 m)   Wt 203 lb (92.1 kg)   LMP 06/29/2017 (Exact Date)   BMI 35.96 kg/m  CBG: 57 mg/dL  FHT:  FHR: 454 bpm, variability: moderate,  accelerations:  Present,  decelerations:  Absent UC:   regular, every 2-4 minutes SVE:   Dilation: 1 Effacement (%): Thick Station: -3 Exam by:: R.Rakhi Romagnoli, CNM Pitocin: 16 milli-units/mL  Labs: Lab Results  Component Value Date   WBC 4.0 03/15/2018   HGB 11.0 (L) 03/15/2018   HCT 32.3 (L) 03/15/2018   MCV 84.3 03/15/2018   PLT 335 03/15/2018    Assessment / Plan: Induction of labor due to UVV, GDMA2, TOLAC, AMA,  progressing well on pitocin  Labor: Progressing on Pitocin, will continue to increase then AROM Preeclampsia:  n/a Fetal Wellbeing:  Category I Pain Control:  Labor support without medications I/D:  n/a Anticipated MOD:  NSVD Hypoglycemia Protocol implemented -- given apple juice Raelyn Mora, MSN, CNM 03/15/2018, 1:11 PM

## 2018-03-15 NOTE — Progress Notes (Signed)
LABOR PROGRESS NOTE  Carolyn EarlsYanet Mendez is a 42 y.o. [id number] at 3931w0d  admitted for IOL for UVV. Pregnancy also compliated by GDMA2 (glyburide), TOLAC (SVD, CS, VBAC x 2), and AMA (42 yo).  Subjective: Patient doing well. Feeling regular contractions, but denies any concerns.   Objective: BP (!) 115/59   Pulse 80   Temp 98.4 F (36.9 C) (Oral)   Resp 16   Ht 5\' 3"  (1.6 m)   Wt 203 lb (92.1 kg)   LMP 06/29/2017 (Exact Date)   BMI 35.96 kg/m  or  Vitals:   03/15/18 1833 03/15/18 1901 03/15/18 1930 03/15/18 1931  BP:  125/72 (!) 122/42 (!) 115/59  Pulse:  87 80 80  Resp: 20 18  16   Temp:    98.4 F (36.9 C)  TempSrc:    Oral  Weight:      Height:        SVE: 6/80%, no able to feel presenting part.  Bedside U/S: breech  FHT: baseline rate 130, moderate varibility, + acel, no decel Toco: ctx q 1-3 min  Assessment / Plan: 42 y.o. [id number] at 6731w0d here for IOL for UVV. Pregnancy also compliated by GDMA2 (glyburide), TOLAC (SVD, CS, VBAC x 2), and AMA (42 yo).  Labor: in to recheck SVE, not able to feel presenting part with SVE. Bedside U/S showing breech presentation. Dr. Earlene Plateravis called to confirm and discuss options with patient. Indication/risks/benefits for C/S was discussed with patient as ECV too high risk at this time given advanced dilation. Fetal Wellbeing:  Cat I Anticipated MOD:  rLTCS The risks of cesarean section discussed with the patient included but were not limited to: bleeding which may require transfusion or reoperation; infection which may require antibiotics; injury to bowel, bladder, ureters or other surrounding organs; injury to the fetus; need for additional procedures including hysterectomy in the event of a life-threatening hemorrhage; placental abnormalities wth subsequent pregnancies, incisional problems, thromboembolic phenomenon and other postoperative/anesthesia complications. The patient concurred with the proposed plan, giving informed written consent  for the procedure. Patient will remain NPO for procedure. Anesthesia and OR aware. Preoperative prophylactic antibiotics and SCDs ordered on call to the OR. To OR when ready.    Frederik PearJulie P Vincent Streater, MD 03/15/2018, 9:33 PM

## 2018-03-16 ENCOUNTER — Encounter (HOSPITAL_COMMUNITY): Payer: Self-pay

## 2018-03-16 DIAGNOSIS — Z98891 History of uterine scar from previous surgery: Secondary | ICD-10-CM

## 2018-03-16 LAB — CBC
HCT: 24 % — ABNORMAL LOW (ref 36.0–46.0)
HEMATOCRIT: 24.6 % — AB (ref 36.0–46.0)
HEMOGLOBIN: 8.5 g/dL — AB (ref 12.0–15.0)
HEMOGLOBIN: 8.2 g/dL — AB (ref 12.0–15.0)
MCH: 29.5 pg (ref 26.0–34.0)
MCH: 29.2 pg (ref 26.0–34.0)
MCHC: 34.6 g/dL (ref 30.0–36.0)
MCHC: 34.2 g/dL (ref 30.0–36.0)
MCV: 85.4 fL (ref 78.0–100.0)
MCV: 85.4 fL (ref 78.0–100.0)
Platelets: 245 10*3/uL (ref 150–400)
Platelets: 246 10*3/uL (ref 150–400)
RBC: 2.88 MIL/uL — ABNORMAL LOW (ref 3.87–5.11)
RBC: 2.81 MIL/uL — ABNORMAL LOW (ref 3.87–5.11)
RDW: 13.2 % (ref 11.5–15.5)
RDW: 13.2 % (ref 11.5–15.5)
WBC: 9 10*3/uL (ref 4.0–10.5)
WBC: 8 10*3/uL (ref 4.0–10.5)

## 2018-03-16 LAB — CREATININE, SERUM
Creatinine, Ser: 0.51 mg/dL (ref 0.44–1.00)
GFR calc non Af Amer: >60 mL/min (ref >60)

## 2018-03-16 LAB — GLUCOSE, CAPILLARY
Glucose-Capillary: 125 mg/dL — ABNORMAL HIGH (ref 65–99)
Glucose-Capillary: 124 mg/dL — ABNORMAL HIGH (ref 65–99)

## 2018-03-16 MED ORDER — LACTATED RINGERS IV SOLN
INTRAVENOUS | Status: DC
  Administered 2018-03-16 (×2): via INTRAVENOUS

## 2018-03-16 MED ORDER — SIMETHICONE 80 MG PO CHEW: 80.0000 mg | CHEWABLE_TABLET | ORAL | Status: DC | PRN

## 2018-03-16 MED ORDER — DIPHENHYDRAMINE HCL 25 MG PO CAPS: 25.0000 mg | ORAL_CAPSULE | Freq: Four times a day (QID) | ORAL | Status: DC | PRN

## 2018-03-16 MED ORDER — LACTATED RINGERS IV BOLUS (SEPSIS)
1000.0000 mL | Freq: Once | INTRAVENOUS | Status: AC
  Administered 2018-03-16: 1000 mL via INTRAVENOUS

## 2018-03-16 MED ORDER — OXYTOCIN 40 UNITS IN LACTATED RINGERS INFUSION - SIMPLE MED: 2.5000 [IU]/h | INTRAVENOUS | Status: AC

## 2018-03-16 MED ORDER — FENTANYL CITRATE (PF) 100 MCG/2ML IJ SOLN
INTRAMUSCULAR | Status: AC
  Administered 2018-03-16: 50 ug via INTRAVENOUS
  Filled 2018-03-16: qty 2

## 2018-03-16 MED ORDER — PRENATAL MULTIVITAMIN CH
1.0000 | ORAL_TABLET | Freq: Every day | ORAL | Status: DC
  Administered 2018-03-16 – 2018-03-18 (×3): 1 via ORAL
  Filled 2018-03-16 (×3): qty 1

## 2018-03-16 MED ORDER — ACETAMINOPHEN 325 MG PO TABS
650.0000 mg | ORAL_TABLET | ORAL | Status: DC | PRN
  Administered 2018-03-16 – 2018-03-17 (×4): 650 mg via ORAL
  Filled 2018-03-16 (×5): qty 2

## 2018-03-16 MED ORDER — NALBUPHINE HCL 10 MG/ML IJ SOLN: 5.0000 mg | INTRAMUSCULAR | Status: DC | PRN

## 2018-03-16 MED ORDER — OXYCODONE HCL 5 MG PO TABS
5.0000 mg | ORAL_TABLET | ORAL | Status: DC | PRN
  Administered 2018-03-16 – 2018-03-18 (×6): 5 mg via ORAL
  Filled 2018-03-16 (×6): qty 1

## 2018-03-16 MED ORDER — TETANUS-DIPHTH-ACELL PERTUSSIS 5-2.5-18.5 LF-MCG/0.5 IM SUSP: 0.5000 mL | Freq: Once | INTRAMUSCULAR | Status: DC

## 2018-03-16 MED ORDER — MENTHOL 3 MG MT LOZG: 1.0000 | LOZENGE | OROMUCOSAL | Status: DC | PRN

## 2018-03-16 MED ORDER — DIPHENHYDRAMINE HCL 50 MG/ML IJ SOLN: 12.5000 mg | INTRAMUSCULAR | Status: DC | PRN

## 2018-03-16 MED ORDER — OXYCODONE HCL 5 MG PO TABS
10.0000 mg | ORAL_TABLET | ORAL | Status: DC | PRN
  Administered 2018-03-17 (×2): 10 mg via ORAL
  Filled 2018-03-16 (×3): qty 2

## 2018-03-16 MED ORDER — IBUPROFEN 600 MG PO TABS
600.0000 mg | ORAL_TABLET | Freq: Four times a day (QID) | ORAL | Status: DC
  Administered 2018-03-16 – 2018-03-18 (×11): 600 mg via ORAL
  Filled 2018-03-16 (×11): qty 1

## 2018-03-16 MED ORDER — NALBUPHINE HCL 10 MG/ML IJ SOLN: 5.0000 mg | Freq: Once | INTRAMUSCULAR | Status: DC | PRN

## 2018-03-16 MED ORDER — FERROUS SULFATE 325 (65 FE) MG PO TABS
325.0000 mg | ORAL_TABLET | Freq: Two times a day (BID) | ORAL | Status: DC
  Administered 2018-03-16 – 2018-03-18 (×4): 325 mg via ORAL
  Filled 2018-03-16 (×4): qty 1

## 2018-03-16 MED ORDER — SIMETHICONE 80 MG PO CHEW
80.0000 mg | CHEWABLE_TABLET | ORAL | Status: DC
  Administered 2018-03-17 – 2018-03-18 (×2): 80 mg via ORAL
  Filled 2018-03-16 (×2): qty 1

## 2018-03-16 MED ORDER — NALOXONE HCL 4 MG/10ML IJ SOLN
1.0000 ug/kg/h | INTRAMUSCULAR | Status: DC | PRN
  Filled 2018-03-16: qty 5

## 2018-03-16 MED ORDER — ENOXAPARIN SODIUM 40 MG/0.4ML ~~LOC~~ SOLN
40.0000 mg | SUBCUTANEOUS | Status: DC
  Administered 2018-03-16 – 2018-03-18 (×3): 40 mg via SUBCUTANEOUS
  Filled 2018-03-16 (×4): qty 0.4

## 2018-03-16 MED ORDER — DIPHENHYDRAMINE HCL 25 MG PO CAPS: 25.0000 mg | ORAL_CAPSULE | ORAL | Status: DC | PRN

## 2018-03-16 MED ORDER — SENNOSIDES-DOCUSATE SODIUM 8.6-50 MG PO TABS
2.0000 | ORAL_TABLET | ORAL | Status: DC
  Administered 2018-03-17 – 2018-03-18 (×2): 2 via ORAL
  Filled 2018-03-16 (×2): qty 2

## 2018-03-16 MED ORDER — SIMETHICONE 80 MG PO CHEW
80.0000 mg | CHEWABLE_TABLET | Freq: Three times a day (TID) | ORAL | Status: DC
  Administered 2018-03-16 – 2018-03-18 (×8): 80 mg via ORAL
  Filled 2018-03-16 (×8): qty 1

## 2018-03-16 MED ORDER — ZOLPIDEM TARTRATE 5 MG PO TABS: 5.0000 mg | ORAL_TABLET | Freq: Every evening | ORAL | Status: DC | PRN

## 2018-03-16 MED ORDER — WITCH HAZEL-GLYCERIN EX PADS: 1.0000 "application " | MEDICATED_PAD | CUTANEOUS | Status: DC | PRN

## 2018-03-16 MED ORDER — DIBUCAINE 1 % RE OINT: 1.0000 "application " | TOPICAL_OINTMENT | RECTAL | Status: DC | PRN

## 2018-03-16 MED ORDER — COCONUT OIL OIL: 1.0000 "application " | TOPICAL_OIL | Status: DC | PRN

## 2018-03-16 NOTE — Progress Notes (Signed)
Unable to ambulate patient at this time. Lactation has recently begun working with mother and about to assist latching baby. Requested for lactation to notify RN when finished working with patient in order for RN to assist to bathroom and remove foley. Earl Galasborne, Linda HedgesStefanie CouplandHudspeth

## 2018-03-16 NOTE — Progress Notes (Signed)
Ambulated patient. Patient only slightly dizzy while standing; however, once she stood completely she stated she felt better. Ambulated to bathroom. Patient stated she felt slightly short of breath. See flowsheet for orthostatics. Notified Iven Finnaroline Neal, CNM. One liter lactated ringer bolus ordered. Will continue to monitor.Encouraged patient not to ambulate without staff assistance.  Earl Galasborne, Linda HedgesStefanie El CerritoHudspeth

## 2018-03-16 NOTE — Progress Notes (Signed)
POSTPARTUM PROGRESS NOTE  POD#1  Subjective:  Carolyn Mendez is a 42 y.o. [id number] s/p CCS for fetal malpresentation. at 3067w0d last nigth. Patient felt lightheaded earlier this morning when getting up from bed. Currently feels well. Denies any concerns. Foley remains in place. Pain is well-controlled.   Objective: Blood pressure (!) 100/55, pulse 81, temperature 98.4 F (36.9 C), temperature source Oral, resp. rate 16, height 5\' 3"  (1.6 m), weight 203 lb (92.1 kg), last menstrual period 06/29/2017, SpO2 97 %, unknown if currently breastfeeding.  Physical Exam:  General: alert, cooperative and no distress Chest: no respiratory distress Heart:regular rate, distal pulses intact Abdomen: soft, nontender,  Uterine Fundus: firm, appropriately tender DVT Evaluation: No calf swelling or tenderness Extremities: trace edema Skin: warm, dry; incision clean/dry/intact with honey com dressing in placed  Recent Labs    03/15/18 0715 03/16/18 0641  HGB 11.0* 8.5*  HCT 32.3* 24.6*    Assessment/Plan: Carolyn Mendez is a 42 y.o. [id number] s/p CCS for fetal malpresentation at 9667w0d   POD#1: D/c foley later this morning when ambulating.   - Counseled on extensively that she had classical incision. Cannot TOLAC in the future, and need for schedules C/S at 36 to 37 weeks with future pregnancies  - Lovenox 40 mg daily for DVT ppx Anemia: Hgb 8.5 from 11.0. Lightheaded earlier this morning. S/p 1 L IVF  - Check Orthostatic VS  - Recheck CBC at 1600 today  - Ferrous sulfate BID Contraception: POP Feeding: breast Dispo: Plan for discharge POD#2 or 3.   LOS: 1 day   Kandra NicolasJulie P DegeleMD 03/16/2018, 9:11 AM

## 2018-03-16 NOTE — Lactation Note (Signed)
This note was copied from a baby's chart. Lactation Consultation Note  Patient Name: Carolyn Mendez ZOXWR'UToday's Date: 03/16/2018 Reason for consult: Initial assessment;Early term 9237-38.6wks  20 hours old female who is still being exclusively BF by her mother, she plans to supplement with formula at some point though, that was her feeding choice upon admission. [facility]formula [facility].  Mom was concerned about baby not getting enough, explained to her about the size of baby's stomach and encouraged her to chose BF over formula feeding. Also, discouraged mom to use pacifiers, during the first 3-4 weeks of life until BF is well established baby had one when entering the room. LC did some hand expression before latching baby at the breast STS, she was heavily bundled, baby was able to latch but not deep enough, mom kept pushing the breast off baby's nose so "she can breathe" explained to mom to importance of a deep latch for milk transfer but she kept pulling the breast off baby's face, baby's nose was not touching the breast.  Encouraged mom to feed baby STS on cues at least 8-12 times/24 hours; and to wake her up every 3 if she's not showing cues. Reviewed how to care for your late preterm baby (SP), BF brochure (SP) and BF resources, mom is aware of LC services and will call PRN.  Maternal Data Formula Feeding for Exclusion: No Has patient been taught Hand Expression?: Yes Does the patient have breastfeeding experience prior to this delivery?: Yes  Feeding Feeding Type: Breast Fed Length of feed: 10 min(baby was still nursing when entering the room)  LATCH Score Latch: Grasps breast easily, tongue down, lips flanged, rhythmical sucking.  Audible Swallowing: A few with stimulation  Type of Nipple: Everted at rest and after stimulation  Comfort (Breast/Nipple): Soft / non-tender  Hold (Positioning): Assistance needed to  correctly position infant at breast and maintain latch.  LATCH Score: 8  Interventions Interventions: Breast feeding basics reviewed;Assisted with latch;Skin to skin;Breast massage;Adjust position;Breast compression;Support pillows;Position options  Lactation Tools Discussed/Used WIC Program: Yes   Consult Status Consult Status: PRN Follow-up type: In-patient    Shaquita [place] ConstableS Jeremey Bascom 03/16/2018, 6:45 PM

## 2018-03-16 NOTE — Anesthesia Postprocedure Evaluation (Signed)
Anesthesia Post Note  Patient: [name]  Procedure(s) Performed: CESAREAN SECTION (N/A )     Patient location during evaluation: PACU Anesthesia Type: Spinal Level of consciousness: oriented and awake and alert Pain management: pain level controlled Vital Signs Assessment: post-procedure vital signs reviewed and stable Respiratory status: spontaneous breathing, respiratory function stable and nonlabored ventilation Cardiovascular status: blood pressure returned to baseline and stable Postop Assessment: no headache, no backache, no apparent nausea or vomiting, spinal receding and patient able to bend at knees Anesthetic complications: no    Last Vitals:  Vitals:   03/16/18 0000 03/16/18 0015  BP: (!) 90/57 (!) 97/59  Pulse: 96 90  Resp: 20 18  Temp:    SpO2: 100% 100%    Last Pain:  Vitals:   03/16/18 0015  TempSrc:   PainSc: 7    Pain Goal:                 Shasta Chinn A.

## 2018-03-16 NOTE — Anesthesia Postprocedure Evaluation (Signed)
Anesthesia Post Note  Patient: Pedro EarlsYanet Ortiz-Ortiz  Procedure(s) Performed: CESAREAN SECTION (N/A )     Patient location during evaluation: Mother Baby Anesthesia Type: Spinal Level of consciousness: awake Pain management: pain level controlled Vital Signs Assessment: post-procedure vital signs reviewed and stable Respiratory status: spontaneous breathing Cardiovascular status: stable Postop Assessment: spinal receding and patient able to bend at knees Anesthetic complications: no   Interpreter used Last Vitals:  Vitals:   03/16/18 0530 03/16/18 0630  BP:  (!) 100/55  Pulse:  81  Resp:  16  Temp:  36.9 C  SpO2: 98% 97%    Last Pain:  Vitals:   03/16/18 0630  TempSrc: Oral  PainSc: 2    Pain Goal:                 Edison PaceWILKERSON,Tomicka Lover

## 2018-03-16 NOTE — Progress Notes (Signed)
[employer] interpreter [phone] used for admission process; discussed with patient the post-operative process, admission paperwork, falls risk, and infant blood sugar needs; patient verbalizes understanding

## 2018-03-16 NOTE — Progress Notes (Signed)
Entered room to assist patient to ambulate and remove foley. Patient had finished breast feeding; however, patient was eating her meal at this time. Will assist when patient finished eating or report to next shift if patient not finished at time of report. Earl Galasborne, Linda HedgesStefanie Indian Lake EstatesHudspeth

## 2018-03-16 NOTE — Plan of Care (Signed)
  Problem: Activity: Goal: Will verbalize the importance of balancing activity with adequate rest periods Note:  Patient states she still feels mild dizziness. Encouraged patient to increase po fluid intake and eat. Patient stating she does not feel very hungry but RN discussed the importance of eating well for healing and to help patient feel better when she ambulated. Will ambulate once she has eaten some more food. Discussed the importance of increased activity when tolerated.

## 2018-03-16 NOTE — Progress Notes (Signed)
MOB was referred for history of depression/anxiety. * Referral screened out by Clinical Social Worker because none of the following criteria appear to apply: ~ History of anxiety/depression during this pregnancy, or of post-partum depression. ~ Diagnosis of anxiety and/or depression within last 3 years (2014) OR * MOB's symptoms currently being treated with medication and/or therapy. Please contact the Clinical Social Worker if needs arise, by MOB request, or if MOB scores greater than 9/yes to question 10 on Edinburgh Postpartum Depression Screen.  It is documented in [facility] PNR that MOB has been given mental health resources and states that she feels she is currently doing well emotionally. 

## 2018-03-16 NOTE — Addendum Note (Signed)
Addendum  created 03/16/18 0837 by Earmon PhoenixWilkerson, Anchor Dwan P, CRNA   Sign clinical note

## 2018-03-16 NOTE — Progress Notes (Addendum)
Planned with patient to remove foley catheter upon administering ibuprofen around 1800 as long as patient more comfortable ambulating and not dizzy. Patient given oxycodone for pain. To allow pain medication to become effective per patient request and then assist patient to bathroom. Discussed with the interpreter the importance of increased ambulation as long as no dizziness, but also reminded patient to ambulate only with staff member for now until otherwise instructed. Carolyn Mendez, Linda HedgesStefanie [place]ud[place]

## 2018-03-17 MED ORDER — SODIUM CHLORIDE 0.9 % IV SOLN
510.0000 mg | Freq: Once | INTRAVENOUS | Status: AC
  Administered 2018-03-17: 510 mg via INTRAVENOUS
  Filled 2018-03-17: qty 17

## 2018-03-17 NOTE — Progress Notes (Signed)
POSTPARTUM PROGRESS NOTE  POD#2  Subjective:  Carolyn Mendez is a 42 y.o. [id number] s/p CCS for fetal malpresentation. at 1551w0d. Patient reports overall feeling better. Still feeling tired, but not feeling lightheaded or dizzy any more. Voiding w/o difficulty. Has not passed flatus. Pain is well-controlled. Having cramping with breastfeeding.  Objective: Blood pressure (!) 101/54, pulse 93, temperature 98.7 F (37.1 C), temperature source Oral, resp. rate 18, height 5\' 3"  (1.6 m), weight 203 lb (92.1 kg), last menstrual period 06/29/2017, SpO2 95 %, unknown if currently breastfeeding.  Physical Exam:  General: alert, cooperative and no distress Chest: no respiratory distress Heart:regular rate, distal pulses 2+ bilaterally Abdomen: soft, nontender,  Uterine Fundus: firm, appropriately tender DVT Evaluation: No calf swelling or tenderness Extremities: trace edema Skin: warm, dry; incision clean/dry/intact with honey com dressing in placed  Recent Labs    03/16/18 0641 03/16/18 1536  HGB 8.5* 8.2*  HCT 24.6* 24.0*    Assessment/Plan: Carolyn Mendez is a 42 y.o. [id number] s/p CCS for fetal malpresentation at 7251w0d   POD#2:  - OOB, ambulate  - Lovenox 40 mg daily for DVT ppx Anemia: Hgb 11.0 -> 8.5 -> 8.2  - Will do Feraheme Contraception: POPs Feeding: breast Dispo: likely d/c home tomorrow   LOS: 2 days   Kandra NicolasJulie P DegeleMD 03/17/2018, 6:11 AM

## 2018-03-18 LAB — CBC
HEMATOCRIT: 23.8 % — AB (ref 36.0–46.0)
Hemoglobin: 8 g/dL — ABNORMAL LOW (ref 12.0–15.0)
MCH: 29.2 pg (ref 26.0–34.0)
MCHC: 33.6 g/dL (ref 30.0–36.0)
MCV: 86.9 fL (ref 78.0–100.0)
PLATELETS: 234 10*3/uL (ref 150–400)
RBC: 2.74 MIL/uL — AB (ref 3.87–5.11)
RDW: 13.5 % (ref 11.5–15.5)
WBC: 6.9 10*3/uL (ref 4.0–10.5)

## 2018-03-18 MED ORDER — FERROUS SULFATE 325 (65 FE) MG PO TABS: 325.0000 mg | ORAL_TABLET | Freq: Two times a day (BID) | ORAL | 1 refills | Status: DC

## 2018-03-18 MED ORDER — OXYCODONE HCL 5 MG PO TABS: 5.0000 mg | ORAL_TABLET | Freq: Four times a day (QID) | ORAL | 0 refills | Status: DC | PRN

## 2018-03-18 MED ORDER — IBUPROFEN 600 MG PO TABS: 600.0000 mg | ORAL_TABLET | Freq: Four times a day (QID) | ORAL | 0 refills | Status: DC

## 2018-03-18 NOTE — Progress Notes (Signed)
CSW received consult due to score 11on Edinburgh Depression Screen. CSW utilized [employer] to assist with language barriers (ID# [id number]).   When CSW arrived, MOB was bonding with infant as evidence by engaging in breastfeeding.  MOB's older daughter was present and with MOB's permission, CSW asked MOB's daughter to leave the room in order to meet with MOB in private. MOB was polite, honest, and receptive to meeting with CSW.   CSW asked about MOB's about her thoughts and feelings regarding being a new mother again.  MOB shared that since having a C-Section, MOB is feeling helpless and feels like she needs to have her oldest daughter present. CSW validated and normalized MOB's pain.  Emotionally MOB communicated that MOB is doing well.   CSW provided education regarding the baby blues period vs. perinatal mood disorders, discussed treatment and gave resources for mental health follow up if concerns arise.  CSW recommends self-evaluation during the postpartum time period using the New Mom Checklist from Postpartum Progress and encouraged MOB to contact a medical professional if symptoms are noted at any time. CSW assessed for safety and MOB denied SI, HI, and DV.  MOB presented with insight and awareness and did not display any acute MH symptoms.     CSW identifies no further need for intervention and no barriers to discharge at this time.  Blaine HamperAngel Boyd-Gilyard, MSW, LCSW Clinical Social Work [phone]

## 2018-03-18 NOTE — Discharge Summary (Signed)
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 yo [id number] at [redacted]w[redacted]d was admitted to the hospital 03/15/2018 for induction of labor due to umbilical vein varix, however, found to have fetal malpresentation. The patient went for cesarean section due to Malpresentation, and delivered a Viable infant,03/15/2018  Membrane Rupture Time/Date: 10:25 PM ,03/15/2018   Details of operation can be found in separate operative Note (due to difficulty delivering infant in dorsoinferior transverse presentation, uterine incision had to be extended vertically). She was counseled on the need for repeat C/S from now one due to classical uterine incision. She is NOT a candidate for TOLAC. Postpartum course was complicated by anemia (EBL was ~1900), and Hgb stabilized at 8.0. She received IV iron on POD#2, and was started on po iron as well. She is ambulating, tolerating a regular diet, passing flatus, and urinating well.  Patient is discharged home in stable condition on 03/18/18.                                     Physical exam  Vitals:   03/17/18 0811 03/17/18 1459 03/17/18 1802 03/18/18 0438  BP: (!) 113/55 (!) 94/48 (!) 105/56 (!) 100/42  Pulse: 100 85 86 73  Resp: 19 16 20 18   Temp: 99 F (37.2 C) 98.3 F (36.8 C) 98.4 F (36.9 C) 98.3 F (36.8 C)  TempSrc: Axillary Axillary Oral Oral  SpO2: 96% 100%    Weight:      Height:       General: alert, cooperative and no distress Lochia: appropriate Uterine Fundus: firm Incision: Healing well with no significant drainage, Dressing is clean, dry, and intact DVT Evaluation: No evidence of DVT seen on physical exam. No significant calf/ankle edema. Labs: Lab Results  Component Value Date   WBC 6.9 03/18/2018   HGB 8.0 (L) 03/18/2018   HCT 23.8 (L) 03/18/2018   MCV 86.9 03/18/2018   PLT 234 03/18/2018   CMP Latest Ref Rng & Units 03/16/2018  Glucose 65 - 99 mg/dL -  BUN 6 - 24 mg/dL -  Creatinine 1.30 -  1.00 mg/dL 1.910.51  Sodium 478134 - 295144 mmol/L -  Potassium 3.5 - 5.2 mmol/L -  Chloride 96 - 106 mmol/L -  CO2 20 - 29 mmol/L -  Calcium 8.7 - 10.2 mg/dL -  Total Protein 6.0 - 8.5 g/dL -  Total Bilirubin 0.0 - 1.2 mg/dL -  Alkaline Phos 39 - 621117 IU/L -  AST 0 - 40 IU/L -  ALT 0 - 32 IU/L -    Discharge instruction: per After Visit Summary and "Baby and Me Booklet".  After visit meds:  Allergies as of 03/18/2018   No Known Allergies     Medication List    STOP taking these medications   aspirin EC 81 MG tablet   glyBURIDE 2.5 MG tablet Commonly known as:  DIABETA     TAKE these medications   docusate sodium 100 MG capsule Commonly known as:  COLACE Take 100 mg by mouth daily as needed for mild constipation.   ferrous sulfate 325 (65 FE) MG tablet Take 1 tablet (325 mg total) by mouth 2 (two) times daily with a meal.   ibuprofen 600 MG tablet Commonly known as:  ADVIL,MOTRIN Take 1 tablet (600 mg total) by mouth every 6 (six) hours.   oxyCODONE 5 MG immediate release tablet Commonly  known as:  Oxy IR/ROXICODONE Take 1 tablet (5 mg total) by mouth every 6 (six) hours as needed for severe pain.   PREPLUS 27-1 MG Tabs Take 1 tablet daily by mouth.   ranitidine 150 MG capsule Commonly known as:  ZANTAC Take 1 capsule (150 mg total) by mouth 2 (two) times daily.       Diet: routine diet  Activity: Advance as tolerated. Pelvic rest for 6 weeks.   Outpatient follow up:2 weeks incision; then 6-week PPV Follow up Appt: Future Appointments  Date Time Provider Department Center  03/30/2018  1:30 PM [facility] NURSE [facility]  05/01/2018  8:20 AM [facility] LAB [facility]  05/01/2018  8:35 AM Conan Bowensavis, Kelly M, MD [facility]    Postpartum contraception: Progesterone only pills  Newborn Data: Live born female  Birth Weight: 7 lb 11.6 oz (3505 g) APGAR: 8, 9  Newborn Delivery   Birth date/time:  03/15/2018 22:30:00 Delivery type:  C-Section, Classical C-section categorization:  Repeat     Baby Feeding: Breast Disposition:home with mother   03/18/2018 Frederik PearJulie P Degele, MD

## 2018-03-18 NOTE — Lactation Note (Signed)
This note was copied from a baby's chart. Lactation Consultation Note  Patient Name: Girl Pedro EarlsYanet Ortiz-Ortiz ZOXWR'UToday's Date: 03/18/2018  Pecola LeisureBaby is 4658 hours old and at a 9% weight loss.  Mom reports baby is feeding well and breasts are becoming heavy.  Discussed initiating pumping with symphony pump every 2-3 hours to stimulate supply and possibly give extra calories to baby.  Mom agreeable.  RN will set pump up.   Maternal Data    Feeding    LATCH Score                   Interventions    Lactation Tools Discussed/Used     Consult Status      Huston FoleyMOULDEN, Elroy Schembri S 03/18/2018, 9:17 AM

## 2018-03-18 NOTE — Discharge Instructions (Signed)
Parto por Lonna Duvalcesrea, cuidados posteriores Cesarean Delivery, Care After Siga estas instrucciones durante las prximas semanas. Estas indicaciones le proporcionan informacin acerca de cmo deber cuidarse despus del procedimiento. Su mdico tambin podr darle indicaciones ms especficas. El tratamiento ha sido planificado segn las prcticas mdicas actuales, pero en algunos casos pueden ocurrir problemas. Comunquese con el mdico si tiene algn problema o preguntas despus del procedimiento. Qu puedo esperar despus del procedimiento? Despus del procedimiento, es comn DIRECTVtener los siguientes sntomas:  Una pequea cantidad de sangre o un lquido transparente que sale de la incisin.  Tiene enrojecimiento, hinchazn o dolor en la zona de la incisin.  Dolores y [employer] molestias abdominales.  Hemorragia vaginal (loquios).  Calambres plvicos.  Fatiga.  Siga estas indicaciones en su casa: Cuidados de la incisin   Siga las indicaciones del mdico acerca del cuidado de la incisin. Haga lo siguiente: ? Lvese las manos con agua y jabn antes de cambiar la venda (vendaje). Use desinfectante para manos si no dispone de [place] y [place]n. ? Cambie el vendaje como se lo haya indicado el mdico. ? No retire los puntos (suturas), las grapas cutneas, la goma para cerrar la piel o las tiras Sligoadhesivas. Es posible que estos cierres cutneos Conservation officer, naturedeban permanecer en la piel durante 2semanas o ms tiempo. Si los bordes de las tiras [address]ivas empiezan a despegarse y Scientific laboratory technicianenroscarse, puede recortar los que estn sueltos. No retire las tiras Agilent Technologiesadhesivas por completo a menos que el mdico se lo indique.  Controle todos los das la zona de la incisin para detectar signos de infeccin. Est atenta a los siguientes signos: ? Aumento del enrojecimiento, la hinchazn o Chief Technology Officerel dolor. ? Mayor presencia de lquido o Afftonsangre. ? Calor. ? Pus o mal olor.  Cuando tosa o estornude, abrace Rockwell Automationuna almohada. Esto ayuda con el dolor y Apple Computerdisminuye  la posibilidad de que su incisin se abra (dehiscencia). Haga esto hasta que cicatrice completamente. Medicamentos  CenterPoint Energyome los medicamentos de venta libre y los recetados solamente como se lo haya indicado el mdico.  Si le recetaron un antibitico, tmelo como se lo haya indicado el mdico. No interrumpa la administracin del antibitico hasta que lo haya terminado. Conducir  No conduzca ni opere maquinaria pesada mientras toma analgsicos recetados.  No conduzca durante 24horas si le administraron un sedante. Estilo de vida  No beba alcohol. Esto es de suma importancia si est amamantando o toma analgsicos.  No consuma productos que contengan tabaco, incluidos cigarrillos, tabaco de Theatre managermascar o cigarrillos electrnicos. Si necesita ayuda para dejar de fumar, consulte al mdico. El tabaco puede retrasar la cicatrizacin. Qu debe comer y beber  Beba al menos 8vasos de ochoonzas (240cc) de agua todos los [address]  [address] a menos que el mdico le indique lo contrario. Si amamanta, quiz deba beber an ms cantidad de agua.  Coma alimentos ricos en Enbridge Energyfibras todos los das. Estos alimentos pueden ayudarla a prevenir o Educational psychologistaliviar el estreimiento. Los alimentos ricos en fibras incluyen, entre otros: ? Panes y cereales integrales. ? Arroz integral. ? Armed forces operational officerrijoles. ? Nils PyleFrutas y verduras frescas. Actividad  Retome sus actividades normales como se lo haya indicado el mdico. Pregntele al mdico qu actividades son seguras para usted.  Descanse todo lo que pueda. Trate de descansar o tomar una siesta mientras el beb duerme.  No levante objetos que pesen ms que su beb o ms de 10 libras (4,5 kg), como se lo haya indicado el mdico.  Pregntele al mdico cundo puede retomar la actividad sexual. Esto puede depender de  lo siguiente: ? Riesgo de sufrir una infeccin. ? Velocidad de cicatrizacin. ? Comodidad y deseo de [employer] sexual. Baarse  No tome baos de inmersin, no nade ni use el jacuzzi  hasta que el mdico lo autorice. Pregntele al mdico si puede ducharse. Delle Reining solo le permitan darse baos de esponja hasta que la incisin se cure.  Mantenga el vendaje seco, como se lo haya indicado el mdico. Instrucciones generales  No use tampones ni se haga duchas vaginales hasta que el mdico la autorice.  Use lo siguiente: ? Ropa cmoda y suelta. ? Un sostn firme y [place].  Controle la sangre que elimina por la vagina para detectar cogulos de [place]. Estos pueden tener el aspecto de grumos de color rojo oscuro, o secrecin marrn o negra.  Mantenga el perineo limpio y seco, como se lo haya indicado el mdico.  Cuando vaya al bao, siempre higiencese de adelante hacia atrs.  Si es posible, pdale a alguien que la ayude a cuidar de su beb y con las tareas del hogar durante [employer] despus de que le den el alta del hospital.  Chauncy Passy a todas las visitas de seguimiento para usted y el beb, como se lo haya indicado el mdico. Esto es importante. Comunquese con un mdico si:  Tiene los siguientes sntomas: ? Una secrecin vaginal con mal olor. ? Dificultad para orinar. ? Dolor al ConocoPhillips. ? Aumento o disminucin repentinos de la frecuencia de las deposiciones. ? Aumento del enrojecimiento, la hinchazn o el dolor alrededor de la incisin. ? Aumento del lquido o de la sangre que sale de la incisin. ? Pus o mal olor en Immunologist de la incisin. ? Fiebre. ? Erupcin cutnea. ? Poco inters o falta de inters en actividades que solan gustarle. ? Dudas sobre su cuidado y el del beb. ? Nuseas.  La incisin est caliente al tacto.  Siente dolor en las mamas y se ponen rojas o duras.  Siente tristeza o preocupacin de forma inusual.  Vomita.  Elimina cogulos de sangre grandes por la vagina. Si expulsa un cogulo de sangre, gurdelo para mostrrselo al [employer]. No tire la cadena sin mostrarle los cogulos de sangre a su mdico.  Orina ms de lo  habitual.  Se siente mareada o se desmaya.  No ha amamantado y no ha tenido un perodo menstrual durante 12 semanas despus del [place].  Dej de amamantar al beb y no ha tenido su perodo menstrual durante 12 semanas despus de haber dejado de Museum/gallery exhibitions officer. Solicite ayuda de inmediato si:  Tiene los siguientes sntomas: ? Dolor que no desaparece o no mejora con medicamentos. ? Journalist, newspaper. ? Dificultad para respirar. ? Visin borrosa o Nurse, adult. ? Pensamientos de autolesionarse o lesionar al beb. ? Air cabin crew abdomen o en una de las piernas. ? Dolor de cabeza intenso.  Se desmaya.  Tiene una hemorragia tan intensa de la vagina que [employer] compresas higinicas en Georgianne Fick. Esta informacin no tiene Theme park manager el consejo del mdico. Asegrese de hacerle al mdico cualquier pregunta que tenga. Document Released: 12/12/2005 Document Revised: 04/03/2017 Document Reviewed: 11/16/2015 Elsevier Interactive Patient Education  [employer].

## 2018-03-19 ENCOUNTER — Ambulatory Visit: Payer: Self-pay

## 2018-03-19 ENCOUNTER — Encounter: Payer: Self-pay | Admitting: *Deleted

## 2018-03-19 NOTE — Lactation Note (Signed)
This note was copied from a baby's chart. Lactation Consultation Note  Patient Name: Carolyn Mendez UJWJX'BToday's Date: 03/19/2018 Reason for consult: Follow-up assessment;Difficult latch;Early term 37-38.6wks;Infant weight loss(Per Eda not able to assist. Spanish - Pacific/Amanda [id number])  Baby is 3387 12/ hours old.  Mom gave me several feedings to update.  LC noted baby every hours with  13-18 ml of EBM from a bottle.  As LC entered the room , LC offered to assist mom to latch the baby.  Baby latched easy and LC eased down chin, but the baby was on and off For 10 mins with swallows. LC assisted to feed the rest of the bottle and  At 1st the baby was taking the bottle poorly and then baby more awake and Took the bottle better. ( 15 ml ).  Sore nipple and engorgement prevention and tx reviewed.  Mom will need a DEBP to go home with if weight is ok at 6 PM.  [facility] , and at 1st mom said she would beable to  Manage the $30.oo. And after [facility]IC called mom to [facility] mom felt she was not  Able to manage the $30.oo. So LC showed mom how to use the DEBP manually  Until her appt. Tomorrow at 2:30 pm. Mom aware to be consistent with her pumping and think Engorgement prevention and tx.  Mother informed of post-discharge support and given phone number to the lactation department, including services for phone call assistance; out-patient appointments; and breastfeeding support group. List of other breastfeeding resources in the community given in the handout. Encouraged mother to call for problems or concerns related to breastfeeding.   Maternal Data Has patient been taught Hand Expression?: Yes Does the patient have breastfeeding experience prior to this delivery?: Yes  Feeding Feeding Type: Breast Milk Nipple Type: Slow - flow Length of feed: 10 min(latch and depth obtained/ baby wouldn't stay latched f/long )  LATCH Score Latch: Grasps breast easily,  tongue down, lips flanged, rhythmical sucking.  Audible Swallowing: Spontaneous and intermittent  Type of Nipple: Everted at rest and after stimulation  Comfort (Breast/Nipple): Filling, red/small blisters or bruises, mild/mod discomfort  Hold (Positioning): Assistance needed to correctly position infant at breast and maintain latch.  LATCH Score: 8  Interventions Interventions: Breast feeding basics reviewed;Assisted with latch;Skin to skin;Breast massage;Hand express;Breast compression;Adjust position;Support pillows;Position options  Lactation Tools Discussed/Used Tools: Pump(mom had pumped off milk earlier - 40 ml per mom ) Breast pump type: Double-Electric Breast Pump WIC Program: Yes Pump Review: Setup, frequency, and cleaning;Milk Storage Initiated by:: (DEBP had been set up )   Consult Status Consult Status: Follow-up Date: 03/19/18 Follow-up type: In-patient    Matilde SprangMargaret Ann Nahuel Wilbert 03/19/2018, 3:59 PM

## 2018-03-22 ENCOUNTER — Other Ambulatory Visit: Payer: Self-pay

## 2018-03-22 ENCOUNTER — Encounter: Payer: Self-pay | Admitting: Obstetrics and Gynecology

## 2018-03-29 ENCOUNTER — Other Ambulatory Visit: Payer: Self-pay

## 2018-03-29 ENCOUNTER — Other Ambulatory Visit: Payer: Self-pay | Admitting: Family Medicine

## 2018-03-29 ENCOUNTER — Encounter: Payer: Self-pay | Admitting: Obstetrics and Gynecology

## 2018-03-30 ENCOUNTER — Encounter: Payer: Self-pay | Admitting: *Deleted

## 2018-03-30 ENCOUNTER — Ambulatory Visit (INDEPENDENT_AMBULATORY_CARE_PROVIDER_SITE_OTHER): Payer: Self-pay | Admitting: *Deleted

## 2018-03-30 VITALS — BP 103/51 | HR 75 | Temp 98.5°F

## 2018-03-30 DIAGNOSIS — Z4889 Encounter for other specified surgical aftercare: Secondary | ICD-10-CM

## 2018-03-30 NOTE — Progress Notes (Signed)
Patient presents to clinic for incision check. Stated she has a burning sensation in the incisional area but has not seen any drainage or other sign of infection. She did have a "fever" of 100.4 two days ago. Her incision was well approximated with no erythema, edema or drainage noted. Patient asked whether she should still take her iron and when that would be checked again. I advised to continue the iron until her postpartum f/u visit. More than likely it will be checked then. Also let her know that she would need a 2hr gtt at that visit she she should not have anything to eat or drink after midnight. Understanding voiced. Patient also concerned about her iv site on her left forearm. I observed that it was edematous and had a bit of old bruising. Told patient this more than likely represented possible fluid leakage around the vein when the iv was still in place or irritation to the vein. Advised warm compresses to the site. Could also alternate with ice for the pain/edema if needed. Patient voiced understanding. Patient would also like to know if she can now drive, as it has been two weeks since her surgery. I consulted Judeth HornErin Lawrence, NP who said as long as pt was not taking narcotic pain medicine driving is ok. I gave this info to patient who voiced understanding. Pt had no further questions and said she would return May 2 for her postpartum appt.

## 2018-03-30 NOTE — Progress Notes (Signed)
Chart reviewed for nurse visit. Agree with plan of care.   Judeth HornLawrence, Nakiah Osgood, NP 03/30/2018 5:45 PM

## 2018-04-05 ENCOUNTER — Encounter: Payer: Self-pay | Admitting: Obstetrics and Gynecology

## 2018-04-05 ENCOUNTER — Other Ambulatory Visit: Payer: Self-pay

## 2018-04-09 ENCOUNTER — Other Ambulatory Visit: Payer: Self-pay | Admitting: Family Medicine

## 2018-04-30 ENCOUNTER — Other Ambulatory Visit: Payer: Self-pay | Admitting: General Practice

## 2018-04-30 DIAGNOSIS — O24919 Unspecified diabetes mellitus in pregnancy, unspecified trimester: Secondary | ICD-10-CM

## 2018-05-01 ENCOUNTER — Other Ambulatory Visit: Payer: Self-pay

## 2018-05-01 ENCOUNTER — Encounter: Payer: Self-pay | Admitting: Family Medicine

## 2018-05-01 ENCOUNTER — Encounter: Payer: Self-pay | Admitting: Obstetrics and Gynecology

## 2018-05-01 ENCOUNTER — Ambulatory Visit (INDEPENDENT_AMBULATORY_CARE_PROVIDER_SITE_OTHER): Payer: Self-pay | Admitting: Obstetrics and Gynecology

## 2018-05-01 VITALS — BP 97/62 | HR 61 | Ht 62.0 in | Wt 180.8 lb

## 2018-05-01 DIAGNOSIS — O0990 Supervision of high risk pregnancy, unspecified, unspecified trimester: Secondary | ICD-10-CM

## 2018-05-01 DIAGNOSIS — O24919 Unspecified diabetes mellitus in pregnancy, unspecified trimester: Secondary | ICD-10-CM

## 2018-05-01 DIAGNOSIS — O09529 Supervision of elderly multigravida, unspecified trimester: Secondary | ICD-10-CM

## 2018-05-01 DIAGNOSIS — Z3202 Encounter for pregnancy test, result negative: Secondary | ICD-10-CM

## 2018-05-01 LAB — POCT PREGNANCY, URINE: PREG TEST UR: NEGATIVE

## 2018-05-01 MED ORDER — NORETHINDRONE 0.35 MG PO TABS: 1.0000 | ORAL_TABLET | Freq: Every day | ORAL | 4 refills | Status: DC

## 2018-05-01 NOTE — Progress Notes (Signed)
Obstetrics/Postpartum Visit  Appointment Date: 05/01/2018  OBGYN Clinic: [facility]  Primary Care Provider: Latrelle Dodrill  Chief Complaint:  Chief Complaint  Patient presents with  . Postpartum Care    History of Present Illness: Carolyn Mendez is a 42 y.o. Hispanic [id number] (Patient's last menstrual period was 06/29/2017 (exact date).), seen for the above chief complaint. Her past medical history is significant for n/a.  She is s/p RCS on 03/15/18 at 39 weeks. She was induced as a TOLAC for umbilical vein varix but subsequently found to be breech and went for repeat c-section; she was discharged to home on POD#3. Pregnancy complicated by gDM, umbilical vein varix  Complains of occasional pinching at incision site  Vaginal bleeding or discharge: No  Breast or formula feeding: breast Intercourse: No  Contraception: POPs PP depression s/s: No  Any bowel or bladder issues: normal bladder function, is having constipation, using colace prn Pap smear: no abnormalities (date: 09/2017)  Review of Systems: Positive for nothing.   Her 12 point review of systems is negative or as noted in the History of Present Illness.  Patient Active Problem List   Diagnosis Date Noted  . Status post cesarean delivery 03/16/2018  . Umbilical vein abnormality affecting pregnancy 03/15/2018  . 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 Language barrier 02/16/2018  . Unwanted fertility 11/27/2017  . History of VBAC 10/30/2017  . AMA (advanced maternal age) multigravida 35+ 10/30/2017  . Irritable bowel disease 02/25/2016  . Depression 09/24/2013  . OBESITY 01/15/2009    Medications Pedro Earls had no medications administered during this visit. Current Outpatient Medications  Medication Sig Dispense Refill  . amoxicillin (AMOXIL) 500 MG capsule TOME UNA CAPSULA POR V?A ORAL CADA OCHO HORAS FOR 7 DAYS  0  . docusate sodium (COLACE) 100 MG capsule Take  100 mg by mouth daily as needed for mild constipation.    . ferrous sulfate 325 (65 FE) MG tablet TAKE 1 TABLET (325 MG TOTAL) BY MOUTH 2 (TWO) TIMES DAILY WITH A MEAL. 60 tablet 0  . ibuprofen (ADVIL,MOTRIN) 600 MG tablet Take 1 tablet (600 mg total) by mouth every 6 (six) hours. 60 tablet 0  . Prenatal Vit-Fe Fumarate-FA (PREPLUS) 27-1 MG TABS Take 1 tablet daily by mouth.    . norethindrone (MICRONOR,CAMILA,ERRIN) 0.35 MG tablet Take 1 tablet (0.35 mg total) by mouth daily. 3 Package 4  . oxyCODONE (OXY IR/ROXICODONE) 5 MG immediate release tablet Take 1 tablet (5 mg total) by mouth every 6 (six) hours as needed for severe pain. (Patient not taking: Reported on 03/30/2018) 20 tablet 0  . ranitidine (ZANTAC) 150 MG capsule Take 1 capsule (150 mg total) by mouth 2 (two) times daily. (Patient not taking: Reported on 03/30/2018) 60 capsule 3   No current facility-administered medications for this visit.     Allergies Patient has no known allergies.  Physical Exam:  BP 97/62   Pulse 61   Ht  (1.575 m)   Wt 180 lb 12.8 oz (82 kg)   LMP 06/29/2017 (Exact Date)   Breastfeeding? Yes   BMI 33.07 kg/m  Body mass index is 33.07 kg/m. General appearance: Well nourished, well developed female in no acute distress.  Cardiovascular: regular rate and rhythm Respiratory:  Clear to auscultation bilateral. Normal respiratory effort Abdomen: positive bowel sounds and no masses, hernias; diffusely non tender to palpation, non distended, well healed pfannenstiel incision Breasts: not examined. Neuro/Psych:  Normal mood and affect.  Skin:  Warm and dry.    PP Depression Screening:  negative  Assessment: Patient is a 42 y.o. [id number] who is 6 weeks post partum from a repeat c-section with T incision. She is doing very well.  Plan:   1. Antepartum multigravida of advanced maternal age  80. Supervision of high risk pregnancy, antepartum  3. Diabetes mellitus complicating pregnancy, antepartum 2 hr  GTT today  4. contraception Reviewed options for oral contraception, risks/benefits, she opts for POP  5. H/o classical c-section Reviewed hospital course with patient, that she had vertical uterine incision and that she must have repeat c-section with any future pregnancies, she verbalizes understanding  6. Language barrier Engineer, structural used   RTC for annual   Baldemar Lenis, M.D. Attending Obstetrician & Gynecologist, [facility] for [employer], [facility]

## 2018-05-02 LAB — GLUCOSE TOLERANCE, 2 HOURS
Glucose, 2 hour: 134 mg/dL (ref 65–139)
Glucose, GTT - Fasting: 89 mg/dL (ref 65–99)

## 2018-05-11 ENCOUNTER — Other Ambulatory Visit: Payer: Self-pay | Admitting: Family Medicine

## 2018-05-25 ENCOUNTER — Other Ambulatory Visit: Payer: Self-pay | Admitting: Family Medicine

## 2018-09-20 ENCOUNTER — Encounter: Payer: Self-pay | Admitting: *Deleted

## 2018-10-22 ENCOUNTER — Other Ambulatory Visit: Payer: Self-pay

## 2018-10-22 ENCOUNTER — Ambulatory Visit (HOSPITAL_COMMUNITY)
Admission: EM | Admit: 2018-10-22 | Discharge: 2018-10-22 | Disposition: A | Discharge status: Home / Self Care | Payer: Self-pay | Attending: Family Medicine | Admitting: Family Medicine

## 2018-10-22 ENCOUNTER — Encounter (HOSPITAL_COMMUNITY): Payer: Self-pay

## 2018-10-22 DIAGNOSIS — S63502A Unspecified sprain of left wrist, initial encounter: Secondary | ICD-10-CM

## 2018-10-22 DIAGNOSIS — X509XXA Other and unspecified overexertion or strenuous movements or postures, initial encounter: Secondary | ICD-10-CM

## 2018-10-22 MED ORDER — PREDNISONE 20 MG PO TABS: 20.0000 mg | ORAL_TABLET | Freq: Every day | ORAL | 0 refills | Status: DC

## 2018-10-22 NOTE — ED Provider Notes (Signed)
[facility]    CSN: [id number] Arrival date & time: 10/22/18  1630     History   Chief Complaint Chief Complaint  Patient presents with  . Wrist Pain    HPI Carolyn Mendez is a 42 y.o. female.   This is a new visit for this 42 year old woman complaining of left wrist pain for the past week.  She works at [employer] and recalls lifting heavy object from a shelf above her head a few days ago.  She then developed some swelling in the lateral 3 fingers and dorsal hand.  She is continued to have pain in her ulnar wrist that radiates into the ulnar 3 fingers.  She has had no problem with grip and she has normal sensation.     Past Medical History:  Diagnosis Date  . Diabetes mellitus without complication (HCC)   . GERD (gastroesophageal reflux disease)     Patient Active Problem List   Diagnosis Date Noted  . Status post cesarean delivery 03/16/2018  . Umbilical vein abnormality affecting pregnancy 03/15/2018  . 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 Language barrier 02/16/2018  . Unwanted fertility 11/27/2017  . History of VBAC 10/30/2017  . AMA (advanced maternal age) multigravida 35+ 10/30/2017  . Irritable bowel disease 02/25/2016  . Depression 09/24/2013  . OBESITY 01/15/2009    Past Surgical History:  Procedure Laterality Date  . CESAREAN SECTION     VBAC x2  . CESAREAN SECTION N/A 03/15/2018   Procedure: CESAREAN SECTION;  Surgeon: Conan Bowens, MD;  Location: [facility];  Service: Obstetrics;  Laterality: N/A;    OB History    Gravida  6   Para  5   Term  5   Preterm  0   AB  1   Living  5     SAB      TAB      Ectopic  1   Multiple  0   Live Births  5            Home Medications    Prior to Admission medications   Medication Sig Start Date End Date Taking? Authorizing Provider  docusate sodium (COLACE) 100 MG capsule Take 100 mg by mouth daily as needed for mild  constipation.    [provider]  predniSONE (DELTASONE) 20 MG tablet Take 1 tablet (20 mg total) by mouth daily with breakfast. Two daily with food 10/22/18   Elvina Sidle, MD  Prenatal Vit-Fe Fumarate-FA (PREPLUS) 27-1 MG TABS Take 1 tablet daily by mouth.    [provider]    Family History Family History  Problem Relation Age of Onset  . Hyperlipidemia Mother   . Kidney disease Mother   . Hyperlipidemia Sister   . Osteoporosis Sister     Social History Social History   Tobacco Use  . Smoking status: Never Smoker  . Smokeless tobacco: Never Used  Substance Use Topics  . Alcohol use: No  . Drug use: No     Allergies   Patient has no known allergies.   Review of Systems Review of Systems   Physical Exam Triage Vital Signs ED Triage Vitals [10/22/18 1643]  Enc Vitals Group     BP 109/66     Pulse Rate 63     Resp 18     Temp 97.8 F (36.6 C)     Temp Source Tympanic     SpO2 100 %  Weight 190 lb (86.2 kg)     Height      Head Circumference      Peak Flow      Pain Score 7     Pain Loc      Pain Edu?      Excl. in GC?    No data found.  Updated Vital Signs BP 109/66 (BP Location: Right Arm)   Pulse 63   Temp 97.8 F (36.6 C) (Tympanic)   Resp 18   Wt 86.2 kg   SpO2 100%   BMI 34.75 kg/m    Physical Exam  Constitutional: She is oriented to person, place, and time. She appears well-developed and well-nourished.  HENT:  Right Ear: External ear normal.  Left Ear: External ear normal.  Mouth/Throat: Oropharynx is clear and moist.  Eyes: Conjunctivae are normal.  Neck: Normal range of motion. Neck supple.  Pulmonary/Chest: Effort normal.  Musculoskeletal: She exhibits no edema, tenderness or deformity.  Pain with hyperextension left wrist  Neurological: She is alert and oriented to person, place, and time.  Skin: Skin is warm and dry.  Nursing note and vitals reviewed.    UC Treatments / Results  Labs (all labs  ordered are listed, but only abnormal results are displayed) Labs Reviewed - No data to display  EKG None  Radiology No results found.  Procedures Procedures (including critical care time)  Medications Ordered in UC Medications - No data to display  Initial Impression / Assessment and Plan / UC Course  I have reviewed the triage vital signs and the nursing notes.  Pertinent labs & imaging results that were available during my care of the patient were reviewed by me and considered in my medical decision making (see chart for details).    Final Clinical Impressions(s) / UC Diagnoses   Final diagnoses:  Sprain of left wrist, initial encounter     Discharge Instructions     If pain returns, go to [facility]    ED Prescriptions    Medication Sig Dispense Auth. Provider   predniSONE (DELTASONE) 20 MG tablet Take 1 tablet (20 mg total) by mouth daily with breakfast. Two daily with food 5 tablet Elvina Sidle, MD     Controlled Substance Prescriptions [place] Controlled Substance Registry consulted? Not Applicable   Elvina Sidle, MD 10/22/18 1721

## 2018-10-22 NOTE — Discharge Instructions (Signed)
If pain returns, go to [facility]

## 2018-10-22 NOTE — ED Triage Notes (Signed)
Pt c/o left wrist pain x 1 week or more.

## 2019-03-25 ENCOUNTER — Telehealth: Payer: Self-pay | Admitting: Family Medicine

## 2019-03-25 NOTE — Telephone Encounter (Signed)
Patient called to ask a few questions about her child's lab test. Please give her a call back.

## 2019-03-25 NOTE — Telephone Encounter (Signed)
Spoke with patient, see note in daughter's chart Carolyn Dodrill, MD

## 2019-04-10 ENCOUNTER — Emergency Department (HOSPITAL_COMMUNITY): Payer: Medicaid Other

## 2019-04-10 ENCOUNTER — Inpatient Hospital Stay (HOSPITAL_COMMUNITY)
Admission: EM | Admit: 2019-04-10 | Discharge: 2019-04-16 | DRG: 419 | Disposition: A | Discharge status: Home / Self Care | Payer: Medicaid Other | Attending: General Surgery | Admitting: General Surgery

## 2019-04-10 ENCOUNTER — Encounter (HOSPITAL_COMMUNITY): Payer: Self-pay | Admitting: *Deleted

## 2019-04-10 ENCOUNTER — Other Ambulatory Visit: Payer: Self-pay

## 2019-04-10 DIAGNOSIS — K8020 Calculus of gallbladder without cholecystitis without obstruction: Secondary | ICD-10-CM

## 2019-04-10 DIAGNOSIS — Z6833 Body mass index (BMI) 33.0-33.9, adult: Secondary | ICD-10-CM

## 2019-04-10 DIAGNOSIS — R079 Chest pain, unspecified: Secondary | ICD-10-CM | POA: Diagnosis present

## 2019-04-10 DIAGNOSIS — E876 Hypokalemia: Secondary | ICD-10-CM | POA: Diagnosis not present

## 2019-04-10 DIAGNOSIS — Z841 Family history of disorders of kidney and ureter: Secondary | ICD-10-CM

## 2019-04-10 DIAGNOSIS — Z419 Encounter for procedure for purposes other than remedying health state, unspecified: Secondary | ICD-10-CM

## 2019-04-10 DIAGNOSIS — Z888 Allergy status to other drugs, medicaments and biological substances status: Secondary | ICD-10-CM

## 2019-04-10 DIAGNOSIS — E119 Type 2 diabetes mellitus without complications: Secondary | ICD-10-CM | POA: Diagnosis present

## 2019-04-10 DIAGNOSIS — K8062 Calculus of gallbladder and bile duct with acute cholecystitis without obstruction: Principal | ICD-10-CM | POA: Diagnosis present

## 2019-04-10 DIAGNOSIS — Z8262 Family history of osteoporosis: Secondary | ICD-10-CM

## 2019-04-10 DIAGNOSIS — R945 Abnormal results of liver function studies: Secondary | ICD-10-CM

## 2019-04-10 DIAGNOSIS — R7989 Other specified abnormal findings of blood chemistry: Secondary | ICD-10-CM

## 2019-04-10 DIAGNOSIS — K760 Fatty (change of) liver, not elsewhere classified: Secondary | ICD-10-CM

## 2019-04-10 DIAGNOSIS — Z7951 Long term (current) use of inhaled steroids: Secondary | ICD-10-CM

## 2019-04-10 DIAGNOSIS — Z8349 Family history of other endocrine, nutritional and metabolic diseases: Secondary | ICD-10-CM

## 2019-04-10 DIAGNOSIS — M545 Low back pain: Secondary | ICD-10-CM | POA: Diagnosis present

## 2019-04-10 DIAGNOSIS — E669 Obesity, unspecified: Secondary | ICD-10-CM | POA: Diagnosis present

## 2019-04-10 DIAGNOSIS — Z885 Allergy status to narcotic agent status: Secondary | ICD-10-CM

## 2019-04-10 DIAGNOSIS — Z79899 Other long term (current) drug therapy: Secondary | ICD-10-CM

## 2019-04-10 DIAGNOSIS — R109 Unspecified abdominal pain: Secondary | ICD-10-CM

## 2019-04-10 DIAGNOSIS — Z83438 Family history of other disorder of lipoprotein metabolism and other lipidemia: Secondary | ICD-10-CM

## 2019-04-10 DIAGNOSIS — K219 Gastro-esophageal reflux disease without esophagitis: Secondary | ICD-10-CM | POA: Diagnosis present

## 2019-04-10 HISTORY — DX: Calculus of gallbladder without cholecystitis without obstruction: K80.20

## 2019-04-10 LAB — COMPREHENSIVE METABOLIC PANEL
ALT: 274 U/L — ABNORMAL HIGH (ref 0–44)
AST: 433 U/L — ABNORMAL HIGH (ref 15–41)
Albumin: 4.1 g/dL (ref 3.5–5.0)
Alkaline Phosphatase: 137 U/L — ABNORMAL HIGH (ref 38–126)
Anion gap: 10 (ref 5–15)
BUN: 16 mg/dL (ref 6–20)
CO2: 26 mmol/L (ref 22–32)
Calcium: 9.3 mg/dL (ref 8.9–10.3)
Chloride: 102 mmol/L (ref 98–111)
Creatinine, Ser: 0.63 mg/dL (ref 0.44–1.00)
GFR calc Af Amer: >60 mL/min (ref >60)
GFR calc non Af Amer: >60 mL/min (ref >60)
Glucose, Bld: 192 mg/dL — ABNORMAL HIGH (ref 70–99)
Potassium: 3.6 mmol/L (ref 3.5–5.1)
Sodium: 138 mmol/L (ref 135–145)
Total Bilirubin: 1.7 mg/dL — ABNORMAL HIGH (ref 0.3–1.2)
Total Protein: 7.2 g/dL (ref 6.5–8.1)

## 2019-04-10 LAB — CBC
HCT: 39.4 % (ref 36.0–46.0)
Hemoglobin: 12.5 g/dL (ref 12.0–15.0)
MCH: 26.8 pg (ref 26.0–34.0)
MCHC: 31.7 g/dL (ref 30.0–36.0)
MCV: 84.5 fL (ref 80.0–100.0)
Platelets: 284 10*3/uL (ref 150–400)
RBC: 4.66 MIL/uL (ref 3.87–5.11)
RDW: 11.9 % (ref 11.5–15.5)
WBC: 9.8 10*3/uL (ref 4.0–10.5)
nRBC: 0 % (ref 0.0–0.2)

## 2019-04-10 LAB — URINALYSIS, ROUTINE W REFLEX MICROSCOPIC
Bilirubin Urine: NEGATIVE
Glucose, UA: NEGATIVE mg/dL
Hgb urine dipstick: NEGATIVE
Ketones, ur: NEGATIVE mg/dL
Leukocytes,Ua: NEGATIVE
Nitrite: NEGATIVE
Protein, ur: NEGATIVE mg/dL
Specific Gravity, Urine: 1.018 (ref 1.005–1.030)
pH: 7 (ref 5.0–8.0)

## 2019-04-10 LAB — I-STAT BETA HCG BLOOD, ED (MC, WL, AP ONLY): I-stat hCG, quantitative: <5 m[IU]/mL (ref <5)

## 2019-04-10 LAB — LIPASE, BLOOD: Lipase: 39 U/L (ref 11–51)

## 2019-04-10 IMAGING — US ULTRASOUND ABDOMEN LIMITED
1 series · 14 of 25 positions shown · non-contrast
Comparison: CT abdomen and pelvis [DATE]

CLINICAL DATA: Right upper quadrant pain for 4 days.

EXAM:
ULTRASOUND ABDOMEN LIMITED RIGHT UPPER QUADRANT

[Series 1: ultrasound abdomen limited · 14 of 54 slices shown]
[im 1/54]
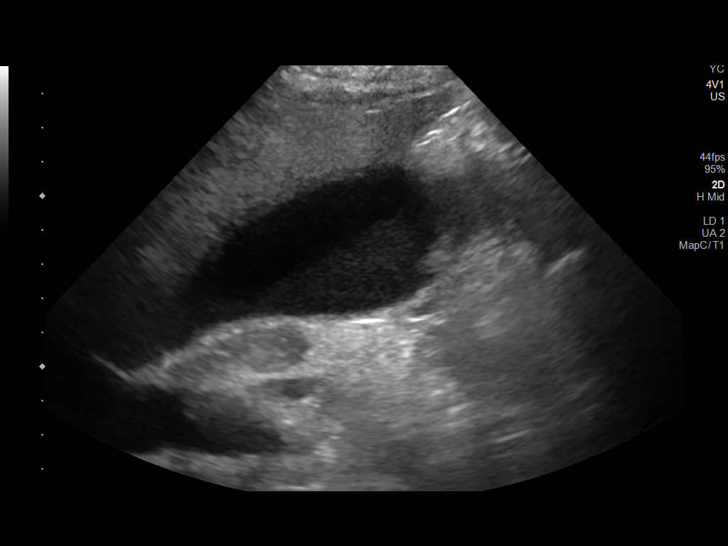
[im 5/54]
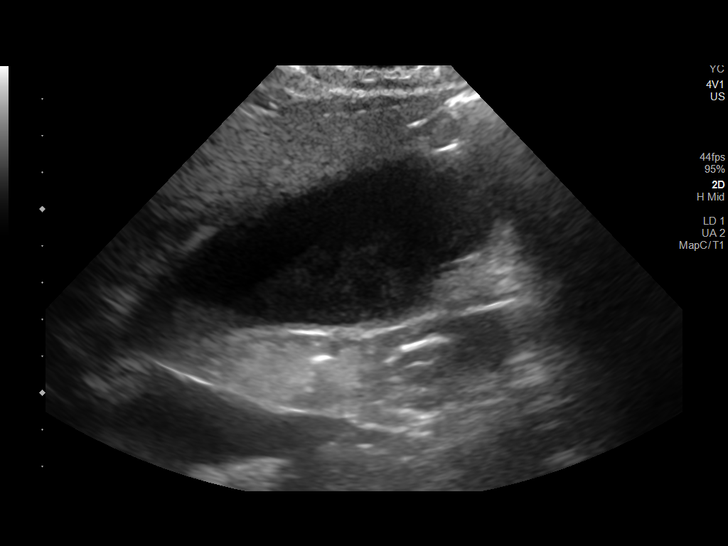
[im 9/54]
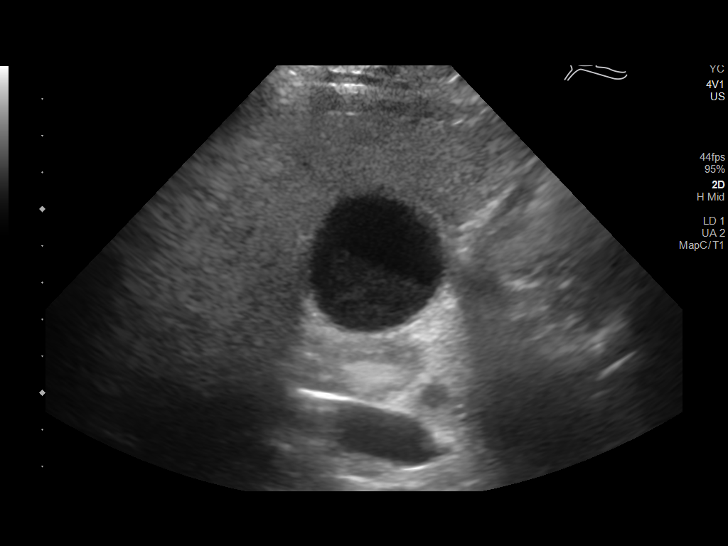
[im 14/54]
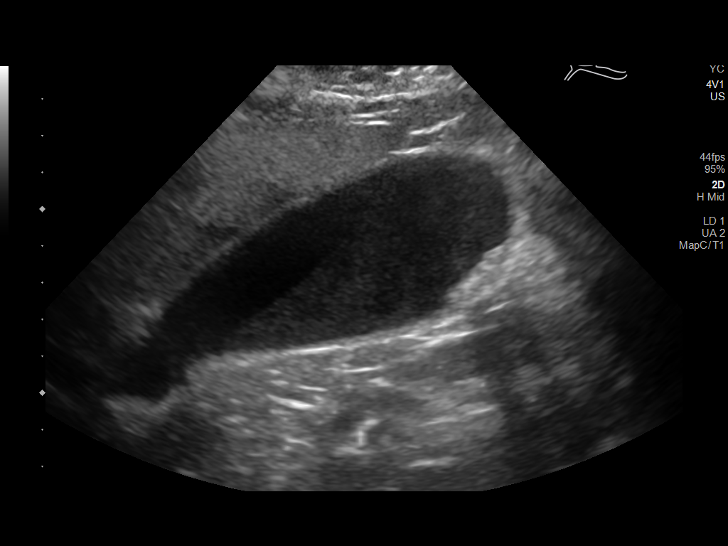
[im 18/54]
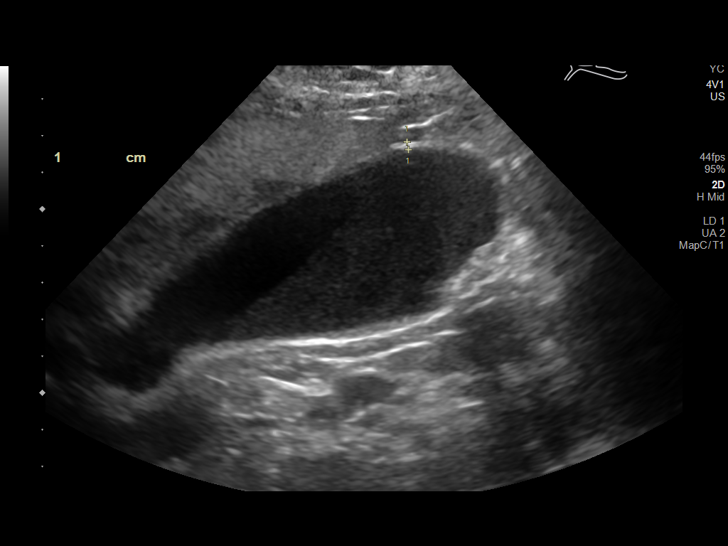
[im 20/54]
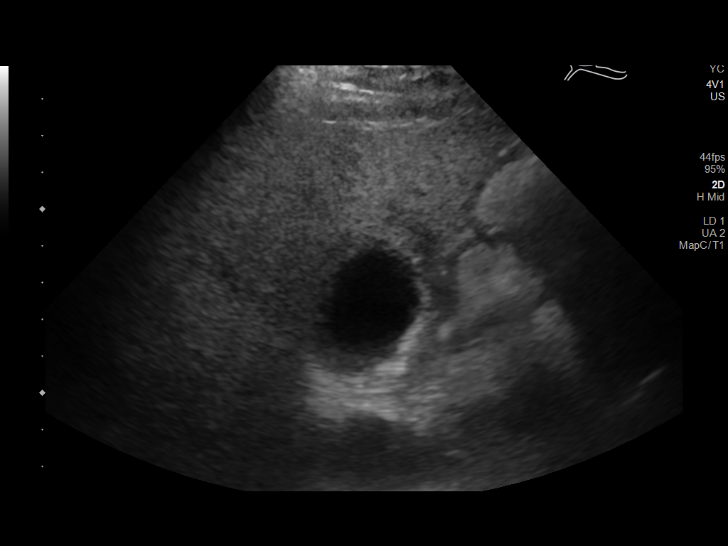
[im 25/54]
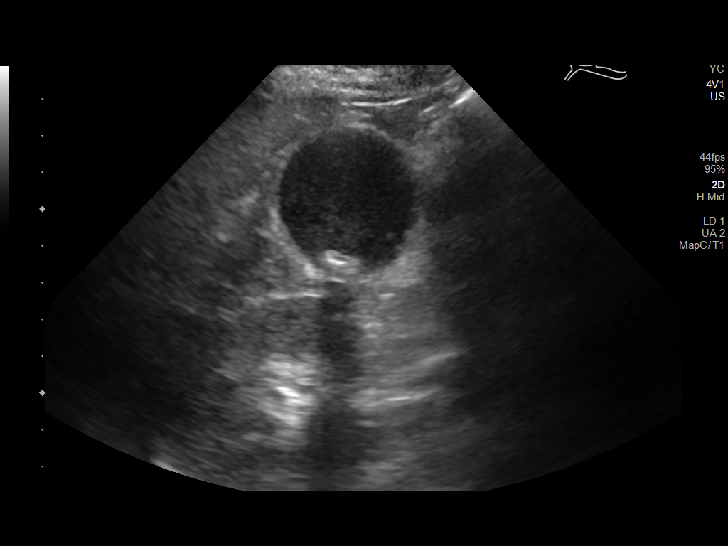
[im 29/54]
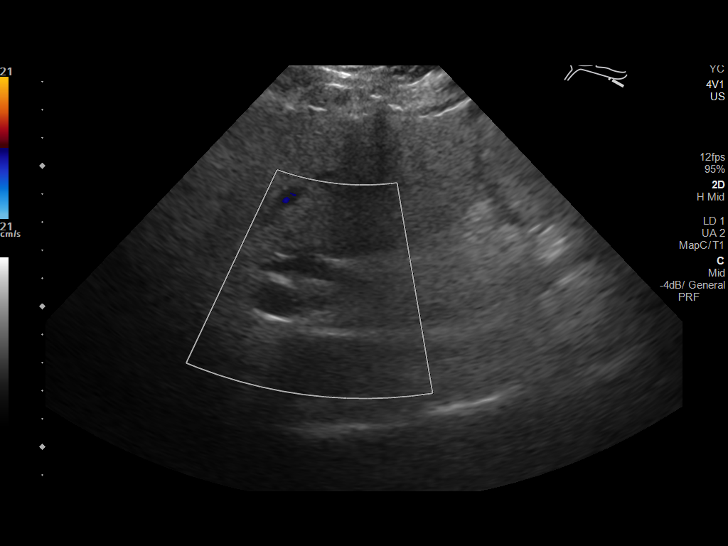
[im 34/54]
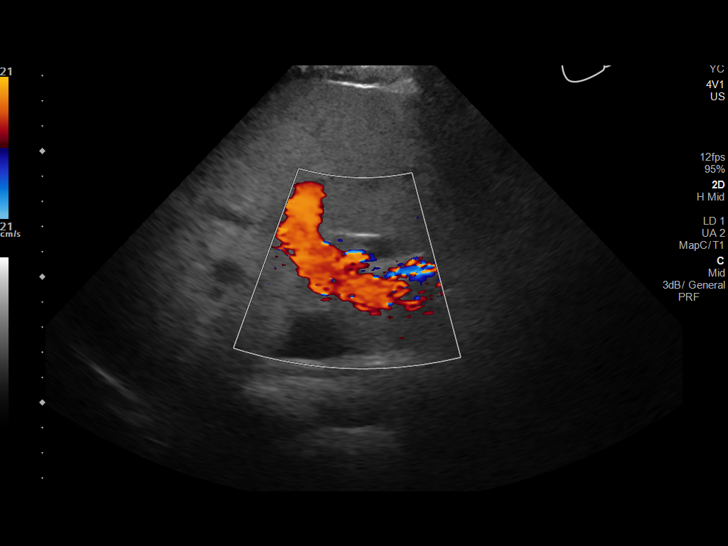
[im 36/54]
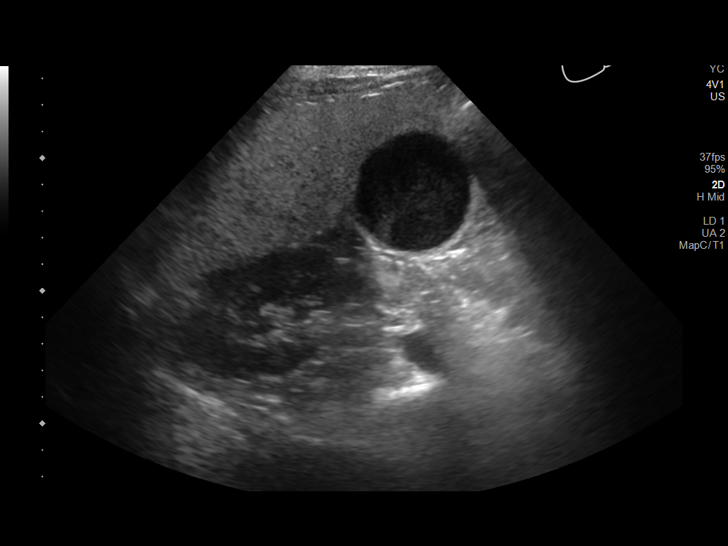
[im 40/54]
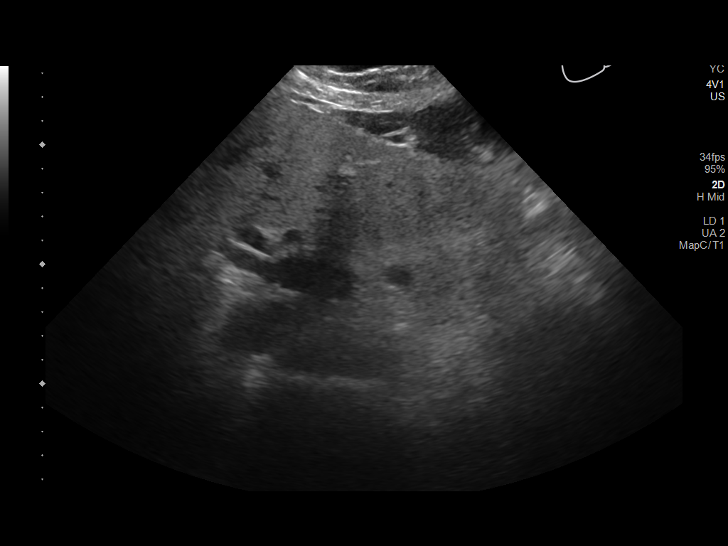
[im 45/54]
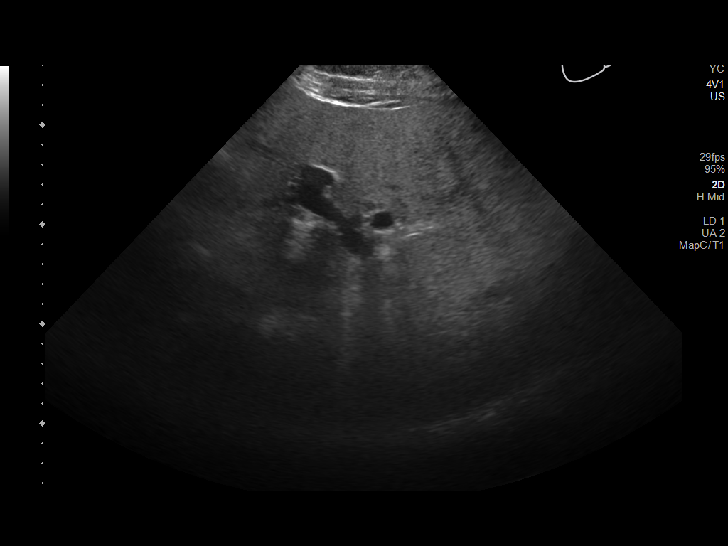
[im 49/54]
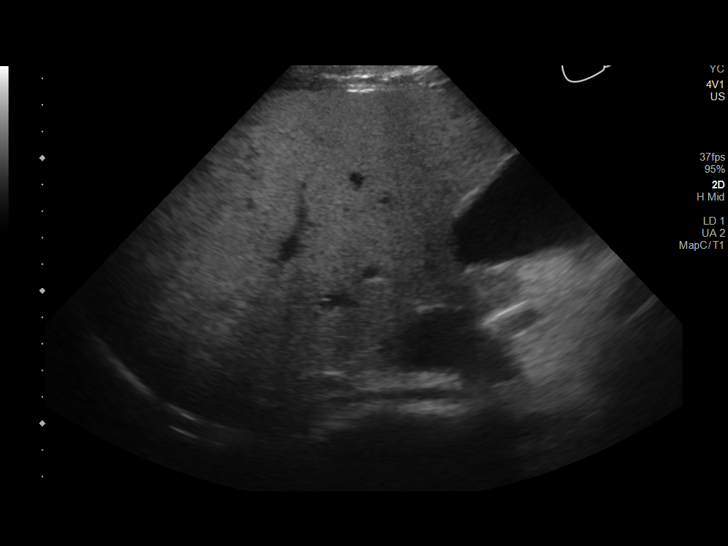
[im 54/54]
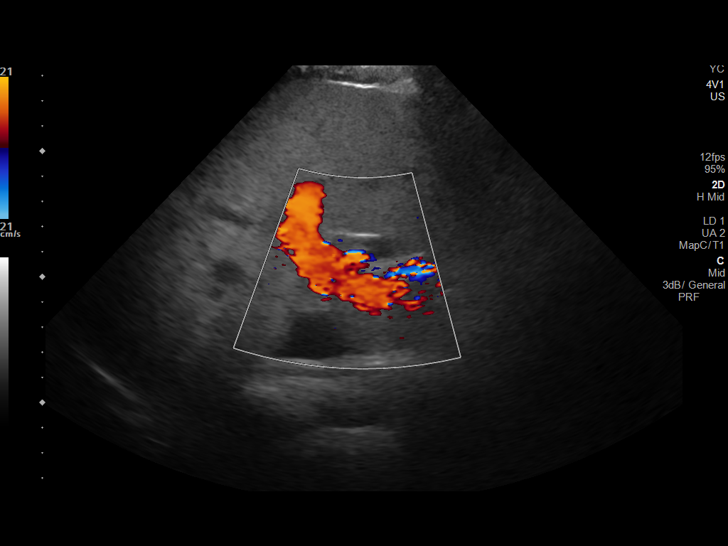

[14 of 25 positions shown; findings below may reference images not displayed]

FINDINGS: Gallbladder:

Moderate volume sludge in the gallbladder with small stones
measuring up to 7 mm. Normal gallbladder wall thickness. No
sonographic Murphy sign noted by sonographer.

Common bile duct:

Diameter: 8 mm, mildly dilated without a common duct stone
identified though with inadequate visualization of the distal duct.

Liver:

Prominently increased parenchymal echogenicity diffusely without
focal lesion identified. Portal vein is patent on color Doppler
imaging with normal direction of blood flow towards the liver.
IMPRESSION: 1. Gallbladder sludge and small stones without evidence of acute
cholecystitis.
2. Mild common bile duct dilatation. No obstructing stone identified
although the distal duct was not visualized.
3. Echogenic liver suggesting steatosis.

## 2019-04-10 IMAGING — MR MR ABDOMEN WO/W CM MRCP
19 of 23 series · 44 of 48 positions shown · IV contrast (gadavist)
Comparison: Ultrasound of [DATE]

CLINICAL DATA: Right upper quadrant pain and nausea for several
days. Cholelithiasis. Mild biliary ductal dilatation on ultrasound.

EXAM:
MRI ABDOMEN WITHOUT AND WITH CONTRAST (INCLUDING MRCP)
TECHNIQUE: Multiplanar multisequence MR imaging of the abdomen was performed
both before and after the administration of intravenous contrast.
Heavily T2-weighted images of the biliary and pancreatic ducts were
obtained, and three-dimensional MRCP images were rendered by post
processing.
CONTRAST:  9 mL Gadavist

[Series 4: ax haste · axial · 6.0mm · 1.28mm/px · 1 of 37 slices shown (1 of 2)]
[im 1/37]
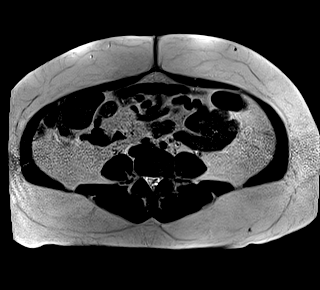

[Series 6: bSSFP · coronal · 6.0mm · 0.80mm/px · 1 of 35 slices shown]
[im 1/35]
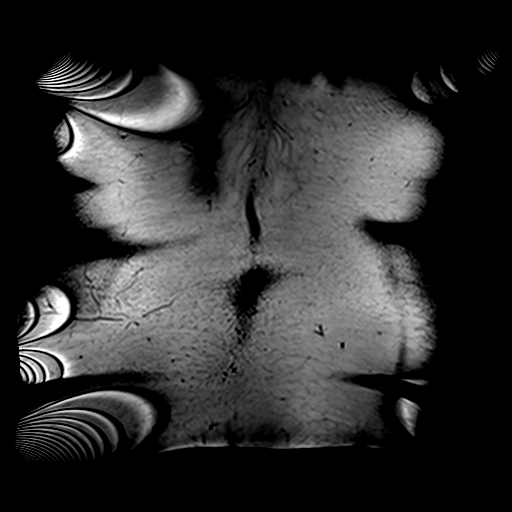

[Series 7: ax haste · axial · 6.0mm · 1.28mm/px · 1 of 40 slices shown (2 of 2)]
[im 1/40]
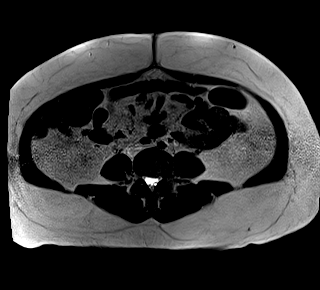

[Series 8: T2 fat-sat · axial · 6.0mm · 1.28mm/px · 1 of 40 slices shown]
[im 1/40]
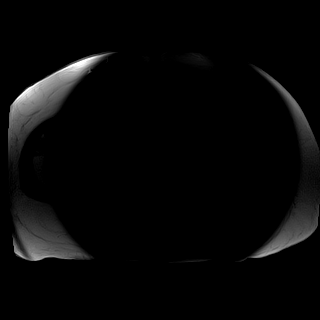

[Series 11: DWI · axial · 6.0mm · 1.53mm/px · z∈[-261,+20]mm · 3 of 120 slices shown (1 of 2)]
[im 1/120]
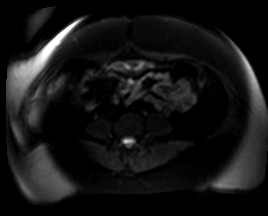
[im 60/120]
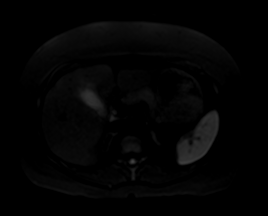
[im 120/120]
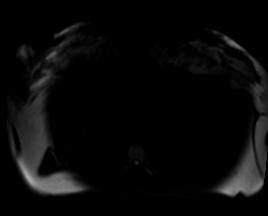

[Series 12: DWI · axial · 6.0mm · 1.53mm/px · 1 of 40 slices shown (2 of 2)]
[im 1/40]
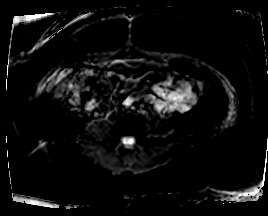

[Series 13: ax in and · axial · 3.0mm · 1.28mm/px · z∈[-279,-18]mm · 5 of 176 slices shown]
[im 1/176]
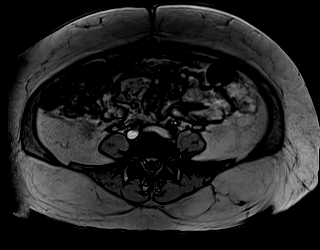
[im 44/176]
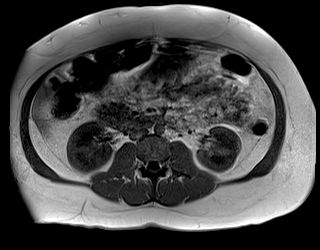
[im 88/176]
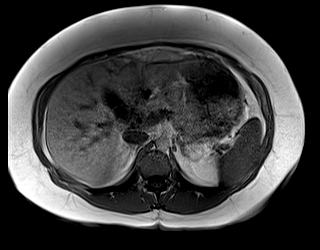
[im 132/176]
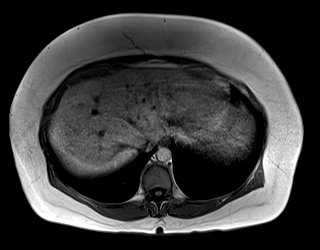
[im 176/176]
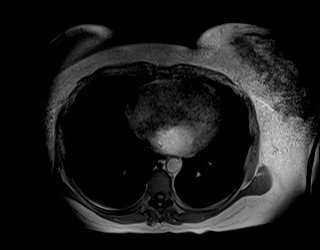

[Series 14: MRCP · coronal · 4.0mm · 1.12mm/px · 1 of 15 slices shown]
[im 1/15]
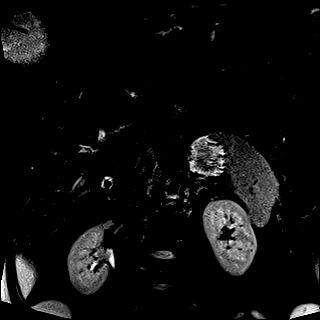

[Series 15: radials · coronal · 50.0mm · 0.78mm/px · 1 of 5 slices shown]
[im 1/5]
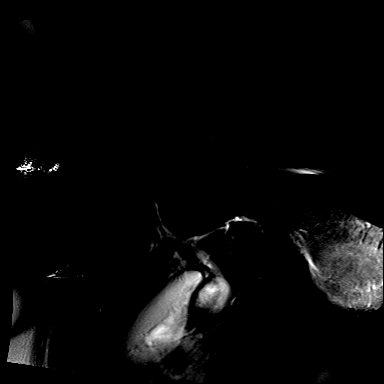

[Series 16: T1 dynamic · axial · non-contrast · 3.0mm · 1.28mm/px · z∈[-274,+11]mm · 3 of 96 slices shown (1 of 5)]
[im 1/96]
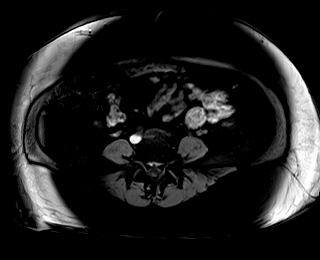
[im 48/96]
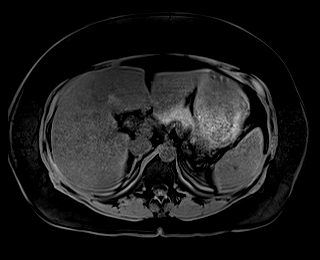
[im 96/96]
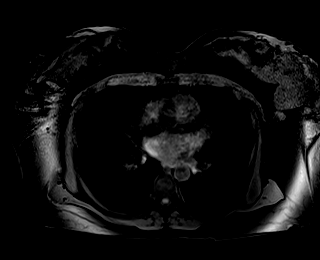

[Series 18: T1 dynamic post-contrast · axial · 3.0mm · 1.28mm/px · z∈[-274,+11]mm · 3 of 96 slices shown (1 of 5)]
[im 1/96]
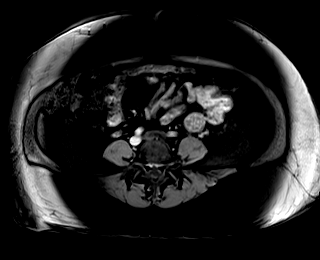
[im 48/96]
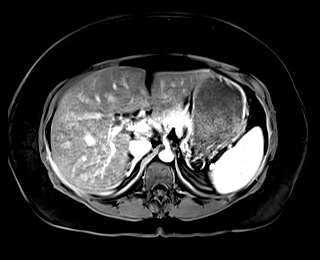
[im 96/96]
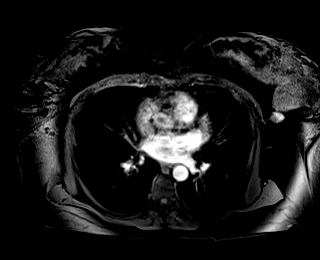

[Series 19: T1 dynamic · axial · 3.0mm · 1.28mm/px · z∈[-274,+11]mm · 3 of 96 slices shown (2 of 5)]
[im 1/96]
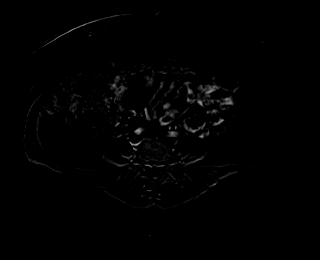
[im 48/96]
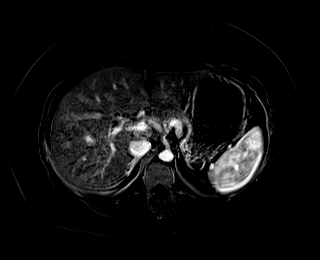
[im 96/96]
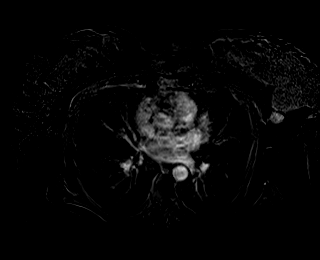

[Series 20: T1 dynamic post-contrast · axial · 3.0mm · 1.28mm/px · z∈[-274,+11]mm · 3 of 96 slices shown (2 of 5)]
[im 1/96]
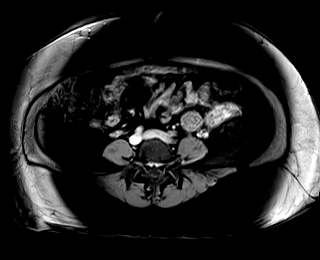
[im 48/96]
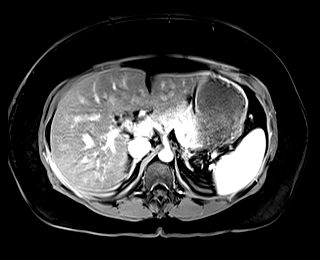
[im 96/96]
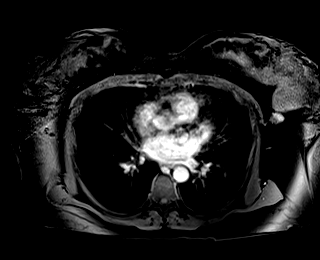

[Series 21: T1 dynamic · axial · 3.0mm · 1.28mm/px · z∈[-274,+11]mm · 3 of 96 slices shown (3 of 5)]
[im 1/96]
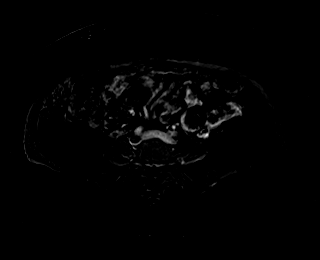
[im 48/96]
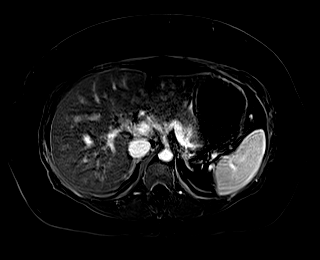
[im 96/96]
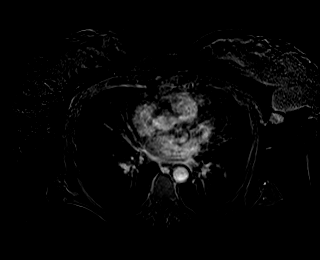

[Series 22: T1 dynamic post-contrast · axial · 3.0mm · 1.28mm/px · z∈[-274,+11]mm · 3 of 96 slices shown (3 of 5)]
[im 1/96]
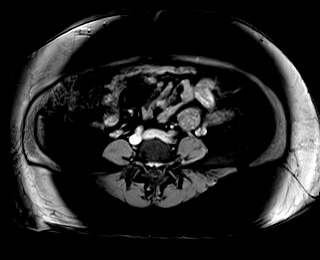
[im 48/96]
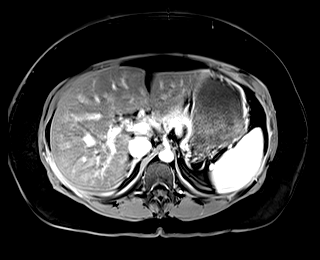
[im 96/96]
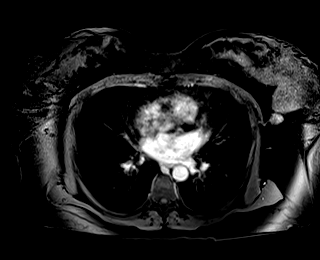

[Series 23: T1 dynamic · axial · 3.0mm · 1.28mm/px · z∈[-274,+11]mm · 3 of 96 slices shown (4 of 5)]
[im 1/96]
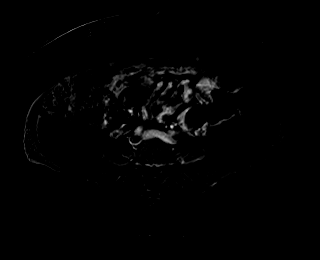
[im 48/96]
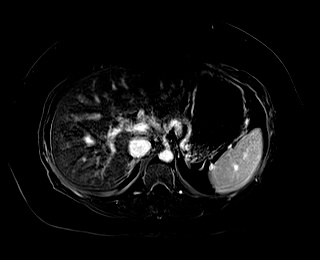
[im 96/96]
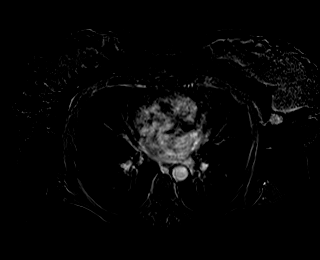

[Series 24: T1 dynamic post-contrast · axial · 3.0mm · 1.28mm/px · z∈[-274,+11]mm · 3 of 96 slices shown (4 of 5)]
[im 1/96]
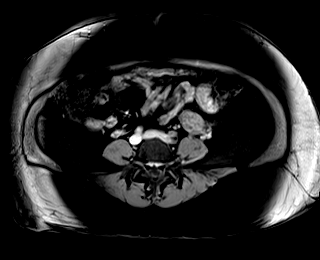
[im 48/96]
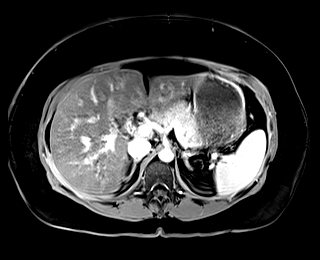
[im 96/96]
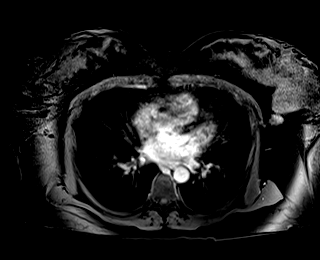

[Series 25: T1 dynamic · axial · 3.0mm · 1.28mm/px · z∈[-274,+11]mm · 3 of 96 slices shown (5 of 5)]
[im 1/96]
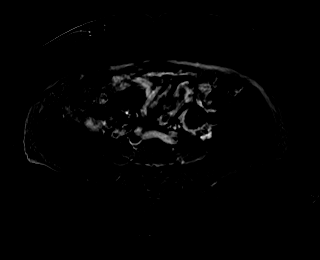
[im 48/96]
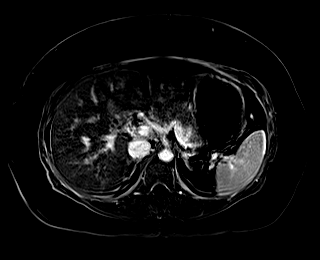
[im 96/96]
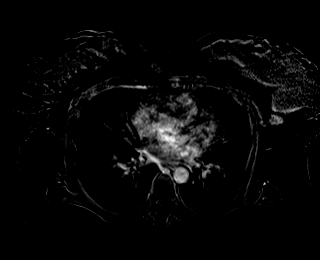

[Series 26: T1 dynamic post-contrast · coronal · 3.0mm · 1.31mm/px · 2 of 80 slices shown (5 of 5)]
[im 1/80]
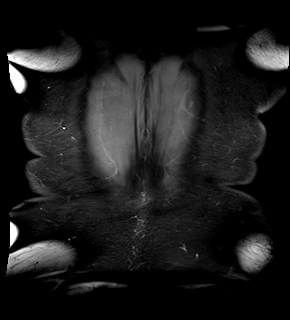
[im 80/80]
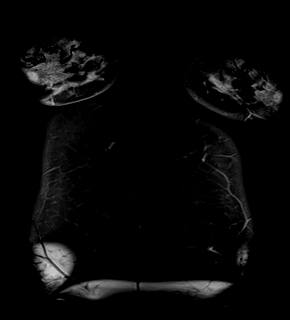

[44 of 48 positions shown; findings below may reference images not displayed]

FINDINGS: Lower chest: No acute findings.

Hepatobiliary: No hepatic masses identified. Diffuse hepatic
steatosis is demonstrated. Small gallstones and gallbladder sludge
are seen. Gallbladder is distended and mild wall thickening is seen
although there is no evidence of pericholecystic inflammatory
changes or fluid.

MRCP images are degraded by motion artifact. Common bile duct
measures 5-6 mm, which is at the upper limits of normal. No evidence
of choledocholithiasis or biliary stricture.

Pancreas: No mass or inflammatory changes. No evidence of pancreatic
ductal dilatation or pancreas divisum.

Spleen:  Within normal limits in size and appearance.

Adrenals/Urinary Tract: No masses identified. No evidence of
hydronephrosis.

Stomach/Bowel: Visualized portion unremarkable.

Vascular/Lymphatic: No pathologically enlarged lymph nodes
identified. No abdominal aortic aneurysm.

Other:  None.

Musculoskeletal:  No suspicious bone lesions identified.
IMPRESSION: Cholelithiasis and gallbladder sludge. Mildly distended gallbladder
with mild wall thickening, which may be due to early acute
cholecystitis. Recommend clinical correlation, and consider nuclear
medicine hepatobiliary scan if warranted.

MRCP image degradation by motion artifact, however no significant
biliary ductal dilatation or choledocholithiasis visualized.

Diffuse hepatic steatosis.

## 2019-04-10 MED ORDER — SODIUM CHLORIDE 0.9 % IV SOLN
2.0000 g | Freq: Every day | INTRAVENOUS | Status: DC
  Administered 2019-04-11 (×2): 2 g via INTRAVENOUS
  Filled 2019-04-10 (×3): qty 20

## 2019-04-10 MED ORDER — MORPHINE SULFATE (PF) 4 MG/ML IV SOLN
4.0000 mg | Freq: Once | INTRAVENOUS | Status: AC
  Administered 2019-04-10: 4 mg via INTRAVENOUS
  Filled 2019-04-10: qty 1

## 2019-04-10 MED ORDER — MORPHINE SULFATE (PF) 2 MG/ML IV SOLN: 1.0000 mg | INTRAVENOUS | Status: DC | PRN

## 2019-04-10 MED ORDER — METHOCARBAMOL 500 MG PO TABS: 500.0000 mg | ORAL_TABLET | Freq: Four times a day (QID) | ORAL | Status: DC | PRN

## 2019-04-10 MED ORDER — ENOXAPARIN SODIUM 40 MG/0.4ML ~~LOC~~ SOLN
40.0000 mg | Freq: Every day | SUBCUTANEOUS | Status: DC
  Administered 2019-04-11: 40 mg via SUBCUTANEOUS

## 2019-04-10 MED ORDER — TRAMADOL HCL 50 MG PO TABS: 50.0000 mg | ORAL_TABLET | Freq: Four times a day (QID) | ORAL | Status: DC | PRN

## 2019-04-10 MED ORDER — SODIUM CHLORIDE 0.9% FLUSH: 3.0000 mL | Freq: Once | INTRAVENOUS | Status: DC

## 2019-04-10 MED ORDER — ONDANSETRON HCL 4 MG/2ML IJ SOLN
4.0000 mg | Freq: Once | INTRAMUSCULAR | Status: AC
  Administered 2019-04-10: 17:00:00 4 mg via INTRAVENOUS
  Filled 2019-04-10: qty 2

## 2019-04-10 MED ORDER — ZOLPIDEM TARTRATE 5 MG PO TABS: 5.0000 mg | ORAL_TABLET | Freq: Every evening | ORAL | Status: DC | PRN

## 2019-04-10 MED ORDER — OXYCODONE HCL 5 MG PO TABS: 5.0000 mg | ORAL_TABLET | ORAL | Status: DC | PRN

## 2019-04-10 MED ORDER — DIPHENHYDRAMINE HCL 50 MG/ML IJ SOLN: 12.5000 mg | Freq: Four times a day (QID) | INTRAMUSCULAR | Status: DC | PRN

## 2019-04-10 MED ORDER — ONDANSETRON HCL 4 MG/2ML IJ SOLN: 4.0000 mg | Freq: Four times a day (QID) | INTRAMUSCULAR | Status: DC | PRN

## 2019-04-10 MED ORDER — IBUPROFEN 600 MG PO TABS: 600.0000 mg | ORAL_TABLET | Freq: Four times a day (QID) | ORAL | Status: DC | PRN

## 2019-04-10 MED ORDER — DIPHENHYDRAMINE HCL 12.5 MG/5ML PO ELIX: 12.5000 mg | ORAL_SOLUTION | Freq: Four times a day (QID) | ORAL | Status: DC | PRN

## 2019-04-10 MED ORDER — PRENATAL MULTIVITAMIN CH
1.0000 | ORAL_TABLET | Freq: Every day | ORAL | Status: DC
  Filled 2019-04-10: qty 1

## 2019-04-10 MED ORDER — GADOBUTROL 1 MMOL/ML IV SOLN
9.0000 mL | Freq: Once | INTRAVENOUS | Status: AC | PRN
  Administered 2019-04-10: 9 mL via INTRAVENOUS

## 2019-04-10 MED ORDER — DEXTROSE IN LACTATED RINGERS 5 % IV SOLN
INTRAVENOUS | Status: DC
  Administered 2019-04-11: via INTRAVENOUS

## 2019-04-10 MED ORDER — ACETAMINOPHEN 650 MG RE SUPP: 650.0000 mg | Freq: Four times a day (QID) | RECTAL | Status: DC | PRN

## 2019-04-10 MED ORDER — SODIUM CHLORIDE 0.9 % IV BOLUS
1000.0000 mL | Freq: Once | INTRAVENOUS | Status: AC
  Administered 2019-04-10: 1000 mL via INTRAVENOUS

## 2019-04-10 MED ORDER — ONDANSETRON 4 MG PO TBDP: 4.0000 mg | ORAL_TABLET | Freq: Four times a day (QID) | ORAL | Status: DC | PRN

## 2019-04-10 MED ORDER — ACETAMINOPHEN 325 MG PO TABS: 650.0000 mg | ORAL_TABLET | Freq: Four times a day (QID) | ORAL | Status: DC | PRN

## 2019-04-10 NOTE — ED Triage Notes (Signed)
Pt in c/o right upper quad pain for the last few days, worse today, describes as a burning, also reports nausea, no distress noted

## 2019-04-10 NOTE — ED Notes (Signed)
Patient transported to US 

## 2019-04-10 NOTE — ED Notes (Signed)
ED TO INPATIENT HANDOFF REPORT  ED Nurse Name and Phone #: Aundra Millet [phone]  S Name/Age/Gender Carolyn Mendez 43 y.o. female Room/Bed: 010C/010C  Code Status   Code Status: Prior  Home/SNF/Other Home Patient oriented to: self, place, time and situation Is this baseline? Yes   Triage Complete: Triage complete  Chief Complaint Stomach Pain  Triage Note Pt in c/o right upper quad pain for the last few days, worse today, describes as a burning, also reports nausea, no distress noted   Allergies No Known Allergies  Level of Care/Admitting Diagnosis ED Disposition    ED Disposition Condition Comment   Admit  Hospital Area: [facility] [[id number]]  Level of Care: Med-Surg [16]  Diagnosis: Symptomatic cholelithiasis [454098]  Admitting Physician: CCS, MD [[id number]]  Attending Physician: CCS, MD [[id number]]  Possible Covid Disease Patient Isolation: N/A  PT Class (Do Not Modify): Observation [104]  PT Acc Code (Do Not Modify): Observation [10022]       B Medical/Surgery History Past Medical History:  Diagnosis Date  . Diabetes mellitus without complication (HCC)   . GERD (gastroesophageal reflux disease)    Past Surgical History:  Procedure Laterality Date  . CESAREAN SECTION     VBAC x2  . CESAREAN SECTION N/A 03/15/2018   Procedure: CESAREAN SECTION;  Surgeon: Conan Bowens, MD;  Location: [facility] BIRTHING SUITES;  Service: Obstetrics;  Laterality: N/A;     A IV Location/Drains/Wounds Patient Lines/Drains/Airways Status   Active Line/Drains/Airways    Name:   Placement date:   Placement time:   Site:   Days:   Peripheral IV 04/10/19 Left Antecubital   04/10/19    1656    Antecubital   less than 1   Incision (Closed) 03/15/18 Abdomen   03/15/18    2253     391   Incision (Closed) 03/15/18 Perineum   03/15/18    2253     391          Intake/Output Last 24 hours  Intake/Output Summary (Last 24 hours) at 04/10/2019 2208 Last data filed at 04/10/2019  1820 Gross per 24 hour  Intake 1000 ml  Output -  Net 1000 ml    Labs/Imaging Results for orders placed or performed during the hospital encounter of 04/10/19 (from the past 48 hour(s))  Lipase, blood     Status: None   Collection Time: 04/10/19  3:43 PM  Result Value Ref Range   Lipase 39 11 - 51 U/L    Comment: Performed at [facility], [address] [place], [place] [address]  Comprehensive metabolic panel     Status: Abnormal   Collection Time: 04/10/19  3:43 PM  Result Value Ref Range   Sodium 138 135 - 145 mmol/L   Potassium 3.6 3.5 - 5.1 mmol/L   Chloride 102 98 - 111 mmol/L   CO2 26 22 - 32 mmol/L   Glucose, Bld 192 (H) 70 - 99 mg/dL   BUN 16 6 - 20 mg/dL   Creatinine, Ser 7.82 0.44 - 1.00 mg/dL   Calcium 9.3 8.9 - 95.6 mg/dL   Total Protein 7.2 6.5 - 8.1 g/dL   Albumin 4.1 3.5 - 5.0 g/dL   AST 213 (H) 15 - 41 U/L   ALT 274 (H) 0 - 44 U/L   Alkaline Phosphatase 137 (H) 38 - 126 U/L   Total Bilirubin 1.7 (H) 0.3 - 1.2 mg/dL   GFR calc non Af Amer >60 >60 mL/min  GFR calc Af Amer >60 >60 mL/min   Anion gap 10 5 - 15    Comment: Performed at [facility], [address], [place], [place] [address]  CBC     Status: None   Collection Time: 04/10/19  3:43 PM  Result Value Ref Range   WBC 9.8 4.0 - 10.5 K/uL   RBC 4.66 3.87 - 5.11 MIL/uL   Hemoglobin 12.5 12.0 - 15.0 g/dL   HCT 91.7 91.5 - 05.6 %   MCV 84.5 80.0 - 100.0 fL   MCH 26.8 26.0 - 34.0 pg   MCHC 31.7 30.0 - 36.0 g/dL   RDW 97.9 48.0 - 16.5 %   Platelets 284 150 - 400 K/uL   nRBC 0.0 0.0 - 0.2 %    Comment: Performed at [facility], [address], [place], [place] [address]  I-Stat beta hCG blood, ED     Status: None   Collection Time: 04/10/19  3:51 PM  Result Value Ref Range   I-stat hCG, quantitative <5.0 <5 mIU/mL   Comment 3            Comment:   GEST. AGE      CONC.  (mIU/mL)   <=1 WEEK        5 - 50     2 WEEKS       50 - 500     3 WEEKS       100 - 10,000     4 WEEKS      1,000 - 30,000        FEMALE AND NON-PREGNANT FEMALE:     LESS THAN 5 mIU/mL   Urinalysis, Routine w reflex microscopic     Status: Abnormal   Collection Time: 04/10/19  5:30 PM  Result Value Ref Range   Color, Urine YELLOW YELLOW   APPearance CLOUDY (A) CLEAR   Specific Gravity, Urine 1.018 1.005 - 1.030   pH 7.0 5.0 - 8.0   Glucose, UA NEGATIVE NEGATIVE mg/dL   Hgb urine dipstick NEGATIVE NEGATIVE   Bilirubin Urine NEGATIVE NEGATIVE   Ketones, ur NEGATIVE NEGATIVE mg/dL   Protein, ur NEGATIVE NEGATIVE mg/dL   Nitrite NEGATIVE NEGATIVE   Leukocytes,Ua NEGATIVE NEGATIVE    Comment: Performed at [facility], [address] [place], [place] [address]   US Abdomen Limited Ruq  Result Date: 04/10/2019 CLINICAL DATA:  Right upper quadrant pain for 4 days. EXAM: ULTRASOUND ABDOMEN LIMITED RIGHT UPPER QUADRANT COMPARISON:  CT abdomen and pelvis 12/12/2008 FINDINGS: Gallbladder: Moderate volume sludge in the gallbladder with small stones measuring up to 7 mm. Normal gallbladder wall thickness. No sonographic Murphy sign noted by sonographer. Common bile duct: Diameter: 8 mm, mildly dilated without a common duct stone identified though with inadequate visualization of the distal duct. Liver: Prominently increased parenchymal echogenicity diffusely without focal lesion identified. Portal vein is patent on color Doppler imaging with normal direction of blood flow towards the liver. IMPRESSION: 1. Gallbladder sludge and small stones without evidence of acute cholecystitis. 2. Mild common bile duct dilatation. No obstructing stone identified although the distal duct was not visualized. 3. Echogenic liver suggesting steatosis. Electronically Signed   By: Sebastian Ache M.D.   On: 04/10/2019 17:33    Pending Labs [employer] (From admission, onward)    Start     Ordered   Signed and Held  HIV antibody (Routine Testing)  Once,   R     Signed  and Held   Signed and Held  CBC  (enoxaparin  (LOVENOX)    CrCl >/= 30 ml/min)  Once,   R    Comments:  Baseline for enoxaparin therapy IF NOT ALREADY DRAWN.  Notify MD if PLT < 100 K.    Signed and Held   Signed and Held  Creatinine, serum  (enoxaparin (LOVENOX)    CrCl >/= 30 ml/min)  Once,   R    Comments:  Baseline for enoxaparin therapy IF NOT ALREADY DRAWN.    Signed and Held   Signed and Held  Creatinine, serum  (enoxaparin (LOVENOX)    CrCl >/= 30 ml/min)  Weekly,   R    Comments:  while on enoxaparin therapy    Signed and Held   Signed and Held  Comprehensive metabolic panel  Tomorrow morning,   R     Signed and Held   Signed and Held  CBC  Tomorrow morning,   R     Signed and Held          Vitals/Pain Today's Vitals   04/10/19 1815 04/10/19 1816 04/10/19 2155 04/10/19 2156  BP: 116/60   114/68  Pulse: 79   68  Resp: 20   16  Temp:      TempSrc:      SpO2: 96%   97%  PainSc:  6  4      Isolation Precautions No active isolations  Medications Medications  sodium chloride flush (NS) 0.9 % injection 3 mL (3 mLs Intravenous Not Given 04/10/19 1630)  sodium chloride 0.9 % bolus 1,000 mL (0 mLs Intravenous Stopped 04/10/19 1820)  ondansetron (ZOFRAN) injection 4 mg (4 mg Intravenous Given 04/10/19 1658)  morphine 4 MG/ML injection 4 mg (4 mg Intravenous Given 04/10/19 1658)  gadobutrol (GADAVIST) 1 MMOL/ML injection 9 mL (9 mLs Intravenous Contrast Given 04/10/19 1926)  morphine 4 MG/ML injection 4 mg (4 mg Intravenous Given 04/10/19 2156)    Mobility walks Low fall risk   Focused Assessments    R Recommendations: See Admitting Provider Note  Report given to:   Additional Notes: Pt speaks spanish, pt alert, oriented, and ambulatory. Pt has 20 gauge in left AC.

## 2019-04-10 NOTE — ED Provider Notes (Addendum)
[facility] EMERGENCY DEPARTMENT Provider Note   CSN: [id number] Arrival date & time: 04/10/19  1452   History   Chief Complaint Chief Complaint  Patient presents with  . Abdominal Pain    HPI Carolyn Mendez is a 43 y.o. female with a hx of GERD, DM, obesity, depression, and prior c-sections who presents to the ED with complaints of abdominal pain x 8 days. Patient states pain is located primarily to the RUQ- radiates around to the back as well as to the epigastrium/LUQ. Pain initially severe, eased off but remained constant, then worsened and uncontrollable today. Pain currently 10/10. Worse with eating, especially spicy foods. Initially alleviated w/ drinking warm water and laying down- no longer helping. Reports associated chills & nausea without vomiting. Denies fever, emesis, dyspnea, dysuria, hematuria, vaginal bleeding, or vaginal discharge. Prior abdominal surgeries include c-section. No recent travel. Last PO intake was 12:00 PM and was chicken soup & yogurt.   Translator line utilized throughout Sales executive.     HPI  Past Medical History:  Diagnosis Date  . Diabetes mellitus without complication ([place])   . GERD (gastroesophageal reflux disease)     Patient Active Problem List   Diagnosis Date Noted  . Status post cesarean delivery 03/16/2018  . Umbilical vein abnormality affecting pregnancy 03/15/2018  . 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 Language barrier 02/16/2018  . Unwanted fertility 11/27/2017  . History of VBAC 10/30/2017  . AMA (advanced maternal age) multigravida 35+ 10/30/2017  . Irritable bowel disease 02/25/2016  . Depression 09/24/2013  . OBESITY 01/15/2009    Past Surgical History:  Procedure Laterality Date  . CESAREAN SECTION     VBAC x2  . CESAREAN SECTION N/A 03/15/2018   Procedure: CESAREAN SECTION;  Surgeon: Sloan Leiter, MD;  Location: [facility];  Service: Obstetrics;   Laterality: N/A;     OB History    Gravida  6   Para  5   Term  5   Preterm  0   AB  1   Living  5     SAB      TAB      Ectopic  1   Multiple  0   Live Births  5            Home Medications    Prior to Admission medications   Medication Sig Start Date End Date Taking? Authorizing Provider  docusate sodium (COLACE) 100 MG capsule Take 100 mg by mouth daily as needed for mild constipation.    [provider]  predniSONE (DELTASONE) 20 MG tablet Take 1 tablet (20 mg total) by mouth daily with breakfast. Two daily with food 10/22/18   Robyn Haber, MD  Prenatal Vit-Fe Fumarate-FA (PREPLUS) 27-1 MG TABS Take 1 tablet daily by mouth.    [provider]    Family History Family History  Problem Relation Age of Onset  . Hyperlipidemia Mother   . Kidney disease Mother   . Hyperlipidemia Sister   . Osteoporosis Sister     Social History Social History   Tobacco Use  . Smoking status: Never Smoker  . Smokeless tobacco: Never Used  Substance Use Topics  . Alcohol use: No  . Drug use: No     Allergies   Patient has no known allergies.   Review of Systems Review of Systems  Constitutional: Positive for chills. Negative for fever.  Respiratory: Negative for cough and shortness of  breath.   Gastrointestinal: Positive for abdominal pain and nausea. Negative for blood in stool, constipation, diarrhea and vomiting.  Genitourinary: Negative for dysuria, vaginal bleeding and vaginal discharge.  All other systems reviewed and are negative.  Physical Exam Updated Vital Signs BP 113/62 (BP Location: Right Arm)   Pulse 75   Temp 97.7 F (36.5 C) (Oral)   Resp 18   SpO2 97%   Physical Exam Vitals signs and nursing note reviewed.  Constitutional:      General: She is not in acute distress.    Appearance: She is well-developed. She is not toxic-appearing.  HENT:     Head: Normocephalic and atraumatic.  Eyes:     General:         Right eye: No discharge.        Left eye: No discharge.     Conjunctiva/sclera: Conjunctivae normal.  Neck:     Musculoskeletal: Neck supple.  Cardiovascular:     Rate and Rhythm: Normal rate and regular rhythm.  Pulmonary:     Effort: Pulmonary effort is normal. No respiratory distress.     Breath sounds: Normal breath sounds. No wheezing, rhonchi or rales.  Abdominal:     General: There is no distension.     Palpations: Abdomen is soft.     Tenderness: There is abdominal tenderness (most prominent to RUQ) in the right upper quadrant, epigastric area and left upper quadrant. There is guarding (mild). There is no rebound. Negative signs include Murphy's sign.  Skin:    General: Skin is warm and dry.     Findings: No rash.  Neurological:     Mental Status: She is alert.     Comments: Clear speech.   Psychiatric:        Behavior: Behavior normal.    ED Treatments / Results  Labs (all labs ordered are listed, but only abnormal results are displayed) Labs Reviewed  COMPREHENSIVE METABOLIC PANEL - Abnormal; Notable for the following components:      Result Value   Glucose, Bld 192 (*)    AST 433 (*)    ALT 274 (*)    Alkaline Phosphatase 137 (*)    Total Bilirubin 1.7 (*)    All other components within normal limits  URINALYSIS, ROUTINE W REFLEX MICROSCOPIC - Abnormal; Notable for the following components:   APPearance CLOUDY (*)    All other components within normal limits  LIPASE, BLOOD  CBC  I-STAT BETA HCG BLOOD, ED ([facility], [facility], [facility] ONLY)    EKG None  Radiology US Abdomen Limited Ruq  Result Date: 04/10/2019 CLINICAL DATA:  Right upper quadrant pain for 4 days. EXAM: ULTRASOUND ABDOMEN LIMITED RIGHT UPPER QUADRANT COMPARISON:  CT abdomen and pelvis 12/12/2008 FINDINGS: Gallbladder: Moderate volume sludge in the gallbladder with small stones measuring up to 7 mm. Normal gallbladder wall thickness. No sonographic Murphy sign noted by sonographer. Common bile duct: Diameter:  8 mm, mildly dilated without a common duct stone identified though with inadequate visualization of the distal duct. Liver: Prominently increased parenchymal echogenicity diffusely without focal lesion identified. Portal vein is patent on color Doppler imaging with normal direction of blood flow towards the liver. IMPRESSION: 1. Gallbladder sludge and small stones without evidence of acute cholecystitis. 2. Mild common bile duct dilatation. No obstructing stone identified although the distal duct was not visualized. 3. Echogenic liver suggesting steatosis. Electronically Signed   By: Logan Bores M.D.   On: 04/10/2019 17:33    Procedures Procedures (including  critical care time)  Medications Ordered in ED Medications  sodium chloride flush (NS) 0.9 % injection 3 mL (has no administration in time range)     Initial Impression / Assessment and Plan / ED Course  I have reviewed the triage vital signs and the nursing notes.  Pertinent labs & imaging results that were available during my care of the patient were reviewed by me and considered in my medical decision making (see chart for details).    Patient presents to the ED with complaints of abdominal pain. Patient nontoxic appearing, in no apparent distress, vitals WNL. On exam patient tender to RUQ w/ some guarding. DDX: cholecystitis, symptomatic cholelithiasis, choledocholithiasis, cholangitis, pancreatitis, GERD/PUD, obstruction/perf. Will evaluate with labs and RUQ [place]. Analgesics, anti-emetics, and fluids administered.   ER work-up reviewed:  CBC: No leukocytosis, no anemia.  CMP: LFTs, T bili, & alk phos are elevated. Hyperglycemia. Otherwise unremarkable Lipase: WNL UA: No UTI Preg test: Negative Imaging: RUQ [place]:  IMPRESSION: 1. Gallbladder sludge and small stones without evidence of acute cholecystitis. 2. Mild common bile duct dilatation. No obstructing stone identified although the distal duct was not visualized. 3. Echogenic liver  suggesting steatosis.  18:54: CONSULT: Discussed with general surgeon Dr. Norman Clay- recommends MRCP if available at this time- this has been ordered.   29:30: RE-EVAL: Pain improved 1-2/10 in severity, resting comfortably.   MRCP performed, prelim read w/ cholelithiasis, no cholecystitis, no choledocholithiasis, normal common bile duct, pending formal interpretation @ 7:00AM.   21:24: CONSULT: Re-discussed with Dr. Norman Clay- accepts patient for admission.   21:40: RE-EVAL: Patient states pain is starting to increase again, not overly severe, re-dose of morphine to be ordered. Discussed results & plan of care, provided opportunity for questions, patient confirms understanding & is agreeable.   22:15: Dr. Norman Clay present in the ED, care transitioned to general surgery team.   Final Clinical Impressions(s) / ED Diagnoses   Final diagnoses:  Abdominal pain  Symptomatic cholelithiasis    ED Discharge Orders    None       Amaryllis Dyke, PA-C 04/10/19 2255    Amaryllis Dyke, PA-C 04/10/19 2256    Isla Pence, MD 04/12/19 1311

## 2019-04-10 NOTE — H&P (Signed)
Carolyn Mendez is an 43 y.o. female.   Chief Complaint: abdominal pain HPI: Pt is a 43 yo F who presented to the ED with 7-8 days of RUQ abdominal pain and nausea.  The pain got acutely worse today and was associated with chills.  Pain worse with eating.  She was previously able to note the pain waxing and waning a bit, but now pain is constant.  10/10 upon arrival to ED.  Pain down to 2 in ED after pain medication.  She does not know of family members with gallbladder disease.  Her last child is 36 year old, but she did not have symptoms like this while pregnant.    Of note, at her last C section, she lost quite a bit of blood.    Past Medical History:  Diagnosis Date  . Diabetes mellitus without complication (HCC)   . GERD (gastroesophageal reflux disease)     Past Surgical History:  Procedure Laterality Date  . CESAREAN SECTION     VBAC x2  . CESAREAN SECTION N/A 03/15/2018   Procedure: CESAREAN SECTION;  Surgeon: Conan Bowens, MD;  Location: [facility];  Service: Obstetrics;  Laterality: N/A;    Family History  Problem Relation Age of Onset  . Hyperlipidemia Mother   . Kidney disease Mother   . Hyperlipidemia Sister   . Osteoporosis Sister    Social History:  reports that she has never smoked. She has never used smokeless tobacco. She reports that she does not drink alcohol or use drugs.  Allergies: No Known Allergies  Meds:   Current Meds  Medication Sig  . Prenatal Vit-Fe Fumarate-FA (PREPLUS) 27-1 MG TABS Take 1 tablet daily by mouth.     Results for orders placed or performed during the hospital encounter of 04/10/19 (from the past 48 hour(s))  Lipase, blood     Status: None   Collection Time: 04/10/19  3:43 PM  Result Value Ref Range   Lipase 39 11 - 51 U/L    Comment: Performed at [facility], [address], [place], [place] [address]  Comprehensive metabolic panel     Status: Abnormal   Collection Time: 04/10/19  3:43 PM  Result Value Ref  Range   Sodium 138 135 - 145 mmol/L   Potassium 3.6 3.5 - 5.1 mmol/L   Chloride 102 98 - 111 mmol/L   CO2 26 22 - 32 mmol/L   Glucose, Bld 192 (H) 70 - 99 mg/dL   BUN 16 6 - 20 mg/dL   Creatinine, Ser 1.19 0.44 - 1.00 mg/dL   Calcium 9.3 8.9 - 14.7 mg/dL   Total Protein 7.2 6.5 - 8.1 g/dL   Albumin 4.1 3.5 - 5.0 g/dL   AST 829 (H) 15 - 41 U/L   ALT 274 (H) 0 - 44 U/L   Alkaline Phosphatase 137 (H) 38 - 126 U/L   Total Bilirubin 1.7 (H) 0.3 - 1.2 mg/dL   GFR calc non Af Amer >60 >60 mL/min   GFR calc Af Amer >60 >60 mL/min   Anion gap 10 5 - 15    Comment: Performed at [facility], [address], [place], [place] [address]  CBC     Status: None   Collection Time: 04/10/19  3:43 PM  Result Value Ref Range   WBC 9.8 4.0 - 10.5 K/uL   RBC 4.66 3.87 - 5.11 MIL/uL   Hemoglobin 12.5 12.0 - 15.0 g/dL   HCT 39.4  36.0 - 46.0 %   MCV 84.5 80.0 - 100.0 fL   MCH 26.8 26.0 - 34.0 pg   MCHC 31.7 30.0 - 36.0 g/dL   RDW 16.111.9 09.611.5 - 04.515.5 %   Platelets 284 150 - 400 K/uL   nRBC 0.0 0.0 - 0.2 %    Comment: Performed at [facility], [address] [place], [place] [address]  I-Stat beta hCG blood, ED     Status: None   Collection Time: 04/10/19  3:51 PM  Result Value Ref Range   I-stat hCG, quantitative <5.0 <5 mIU/mL   Comment 3            Comment:   GEST. AGE      CONC.  (mIU/mL)   <=1 WEEK        5 - 50     2 WEEKS       50 - 500     3 WEEKS       100 - 10,000     4 WEEKS     1,000 - 30,000        FEMALE AND NON-PREGNANT FEMALE:     LESS THAN 5 mIU/mL   Urinalysis, Routine w reflex microscopic     Status: Abnormal   Collection Time: 04/10/19  5:30 PM  Result Value Ref Range   Color, Urine YELLOW YELLOW   APPearance CLOUDY (A) CLEAR   Specific Gravity, Urine 1.018 1.005 - 1.030   pH 7.0 5.0 - 8.0   Glucose, UA NEGATIVE NEGATIVE mg/dL   Hgb urine dipstick NEGATIVE NEGATIVE   Bilirubin Urine NEGATIVE NEGATIVE   Ketones, ur NEGATIVE NEGATIVE mg/dL   Protein, ur  NEGATIVE NEGATIVE mg/dL   Nitrite NEGATIVE NEGATIVE   Leukocytes,Ua NEGATIVE NEGATIVE    Comment: Performed at [facility], [address] [place], [place] [address]   K[place]as Abdomen Limited Ruq  Result Date: 04/10/2019 CLINICAL DATA:  Right upper quadrant pain for 4 days. EXAM: ULTRASOUND ABDOMEN LIMITED RIGHT UPPER QUADRANT COMPARISON:  CT abdomen and pelvis 12/12/2008 FINDINGS: Gallbladder: Moderate volume sludge in the gallbladder with small stones measuring up to 7 mm. Normal gallbladder wall thickness. No sonographic Murphy sign noted by sonographer. Common bile duct: Diameter: 8 mm, mildly dilated without a common duct stone identified though with inadequate visualization of the distal duct. Liver: Prominently increased parenchymal echogenicity diffusely without focal lesion identified. Portal vein is patent on color Doppler imaging with normal direction of blood flow towards the liver. IMPRESSION: 1. Gallbladder sludge and small stones without evidence of acute cholecystitis. 2. Mild common bile duct dilatation. No obstructing stone identified although the distal duct was not visualized. 3. Echogenic liver suggesting steatosis. Electronically Signed   By: Sebastian AcheAllen  Grady M.D.   On: 04/10/2019 17:33    Review of Systems  Constitutional: Positive for chills.  HENT: Negative.   Eyes: Negative.   Respiratory: Negative.   Cardiovascular: Negative.   Gastrointestinal: Positive for abdominal pain and nausea.  Genitourinary: Positive for flank pain.  Skin: Negative.   Neurological: Negative.   Endo/Heme/Allergies: Negative.   Psychiatric/Behavioral: Negative.   All other systems reviewed and are negative.   Blood pressure 116/60, pulse 79, temperature 97.7 F (36.5 C), temperature source Oral, resp. rate 20, SpO2 96 %, currently breastfeeding. Physical Exam  Constitutional: She is oriented to person, place, and time. She appears well-developed and well-nourished. She appears distressed  (looks uncomfortable.  ).  HENT:  Head: Normocephalic and atraumatic.  Mouth/Throat: Oropharynx is clear and moist.  Eyes: Pupils are equal, round, and reactive to light. Conjunctivae are normal. No scleral icterus.  Neck: Normal range of motion. Neck supple. No thyromegaly present.  Cardiovascular: Normal rate, regular rhythm and intact distal pulses.  Respiratory: Effort normal. No respiratory distress.  GI: Soft. She exhibits no distension. There is abdominal tenderness (mild RUQ tenderness). There is no rebound and no guarding.  Musculoskeletal: Normal range of motion.        General: No tenderness, deformity or edema.  Neurological: She is alert and oriented to person, place, and time. Coordination normal.  Skin: Skin is warm and dry. No rash noted. She is not diaphoretic. No erythema. No pallor.  Psychiatric: She has a normal mood and affect. Her behavior is normal. Judgment and thought content normal.     Assessment/Plan Symptomatic cholelithiasis. Elevated LFTs. Probable choledocholithiasis that resolved   RUQ u/s was concerning for ductal dilation.  MRCP performed to attempt to expedite ERCP if needed.  MRCP final read pending  Will need lap chole. (posted for Dr. Derrell Lolling) Will recheck LFTs in AM.  If bili higher, might need potential ERCP with lap chole.  Otherwise, just cholangiogram.  IV antibiotics NPO after midnight.    Spanish interpreter required, adding additional time.    Almond Lint, MD 04/10/2019, 9:28 PM

## 2019-04-10 NOTE — ED Notes (Signed)
Pt ambulated to the restroom with steady gait. 

## 2019-04-10 NOTE — ED Notes (Signed)
Urine Culture collected and sent to main lab. 

## 2019-04-10 NOTE — ED Notes (Signed)
ED Provider at bedside. 

## 2019-04-11 ENCOUNTER — Other Ambulatory Visit: Payer: Self-pay

## 2019-04-11 ENCOUNTER — Encounter (HOSPITAL_COMMUNITY): Payer: Self-pay | Admitting: Certified Registered Nurse Anesthetist

## 2019-04-11 ENCOUNTER — Encounter (HOSPITAL_COMMUNITY): Admission: EM | Disposition: A | Discharge status: Home / Self Care | Payer: Self-pay | Source: Home / Self Care

## 2019-04-11 ENCOUNTER — Observation Stay (HOSPITAL_COMMUNITY): Payer: Medicaid Other | Admitting: Certified Registered Nurse Anesthetist

## 2019-04-11 ENCOUNTER — Observation Stay (HOSPITAL_COMMUNITY): Payer: Medicaid Other

## 2019-04-11 DIAGNOSIS — K8062 Calculus of gallbladder and bile duct with acute cholecystitis without obstruction: Secondary | ICD-10-CM | POA: Diagnosis not present

## 2019-04-11 DIAGNOSIS — K219 Gastro-esophageal reflux disease without esophagitis: Secondary | ICD-10-CM | POA: Diagnosis not present

## 2019-04-11 DIAGNOSIS — E119 Type 2 diabetes mellitus without complications: Secondary | ICD-10-CM | POA: Diagnosis not present

## 2019-04-11 DIAGNOSIS — K760 Fatty (change of) liver, not elsewhere classified: Secondary | ICD-10-CM | POA: Diagnosis not present

## 2019-04-11 HISTORY — PX: CHOLECYSTECTOMY: SHX55

## 2019-04-11 LAB — COMPREHENSIVE METABOLIC PANEL
ALT: 807 U/L — ABNORMAL HIGH (ref 0–44)
AST: 866 U/L — ABNORMAL HIGH (ref 15–41)
Albumin: 3.6 g/dL (ref 3.5–5.0)
Alkaline Phosphatase: 148 U/L — ABNORMAL HIGH (ref 38–126)
Anion gap: 10 (ref 5–15)
BUN: 12 mg/dL (ref 6–20)
CO2: 20 mmol/L — ABNORMAL LOW (ref 22–32)
Calcium: 8.8 mg/dL — ABNORMAL LOW (ref 8.9–10.3)
Chloride: 108 mmol/L (ref 98–111)
Creatinine, Ser: 0.46 mg/dL (ref 0.44–1.00)
GFR calc Af Amer: >60 mL/min (ref >60)
GFR calc non Af Amer: >60 mL/min (ref >60)
Glucose, Bld: 139 mg/dL — ABNORMAL HIGH (ref 70–99)
Potassium: 3.8 mmol/L (ref 3.5–5.1)
Sodium: 138 mmol/L (ref 135–145)
Total Bilirubin: 2.3 mg/dL — ABNORMAL HIGH (ref 0.3–1.2)
Total Protein: 6.7 g/dL (ref 6.5–8.1)

## 2019-04-11 LAB — CBC
HCT: 37.7 % (ref 36.0–46.0)
Hemoglobin: 12 g/dL (ref 12.0–15.0)
MCH: 26.8 pg (ref 26.0–34.0)
MCHC: 31.8 g/dL (ref 30.0–36.0)
MCV: 84.3 fL (ref 80.0–100.0)
Platelets: 282 10*3/uL (ref 150–400)
RBC: 4.47 MIL/uL (ref 3.87–5.11)
RDW: 11.9 % (ref 11.5–15.5)
WBC: 3.9 10*3/uL — ABNORMAL LOW (ref 4.0–10.5)
nRBC: 0 % (ref 0.0–0.2)

## 2019-04-11 LAB — SURGICAL PCR SCREEN
MRSA, PCR: NEGATIVE
Staphylococcus aureus: NEGATIVE

## 2019-04-11 LAB — GLUCOSE, CAPILLARY: Glucose-Capillary: 164 mg/dL — ABNORMAL HIGH (ref 70–99)

## 2019-04-11 LAB — HIV ANTIBODY (ROUTINE TESTING W REFLEX): HIV Screen 4th Generation wRfx: NONREACTIVE

## 2019-04-11 IMAGING — RF INTRAOPERATIVE CHOLANGIOGRAM
1 series · 4 of 4 positions shown · non-contrast
Comparison: None.

CLINICAL DATA: Intraoperative cholangiogram

EXAM:
INTRAOPERATIVE CHOLANGIOGRAM
TECHNIQUE: Cholangiographic images from the C-arm fluoroscopic device were
submitted for interpretation post-operatively. Please see the
procedural report for the amount of contrast and the fluoroscopy
time utilized.

[Series 1: run · 2 acquisitions, 4 frames shown]
[im 1/2]
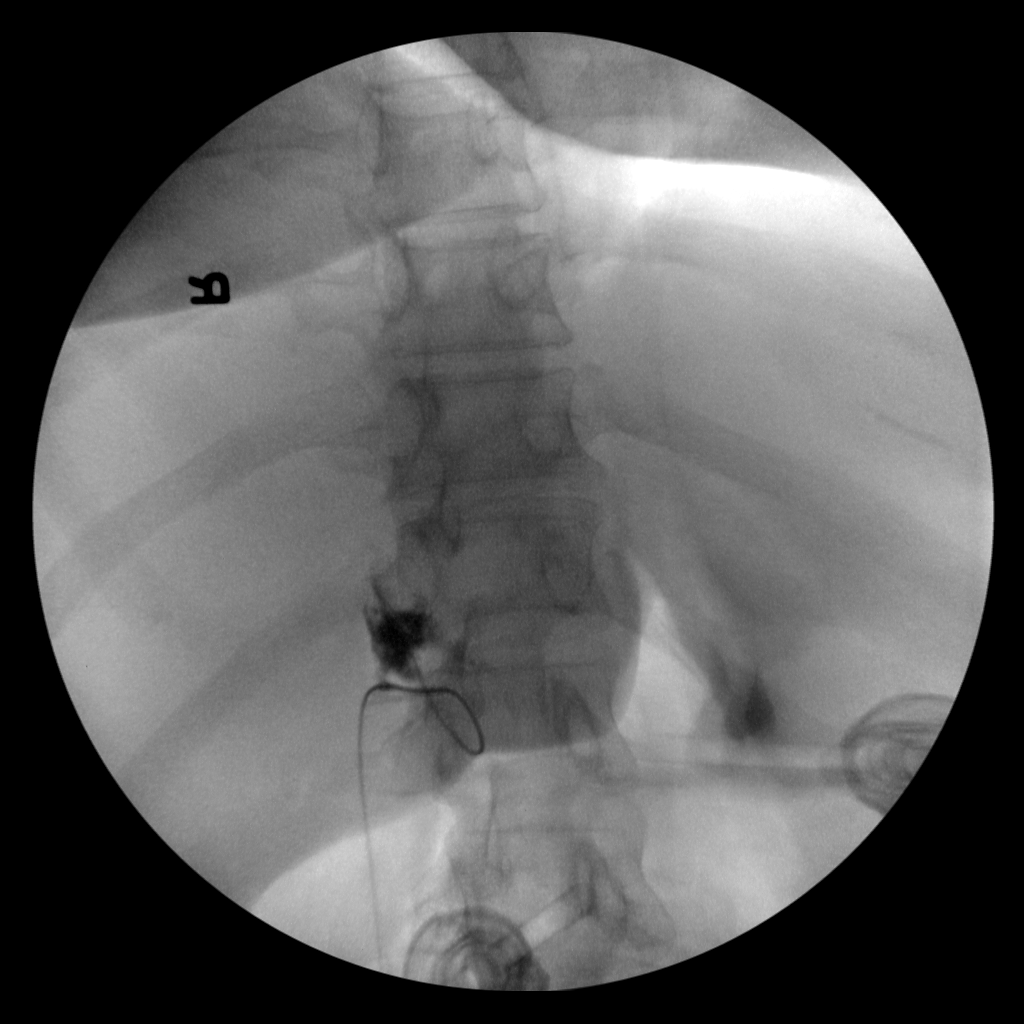
[im 1/2]
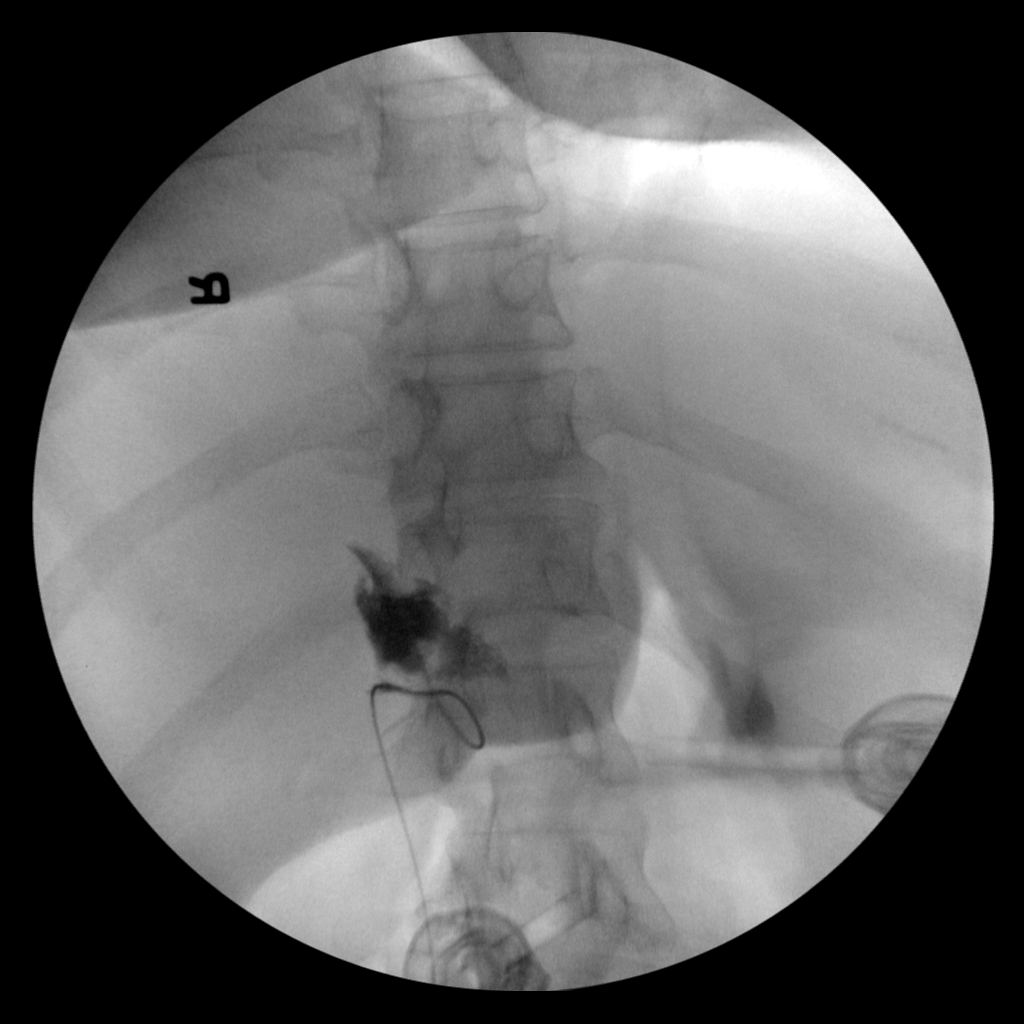
[im 1/2]
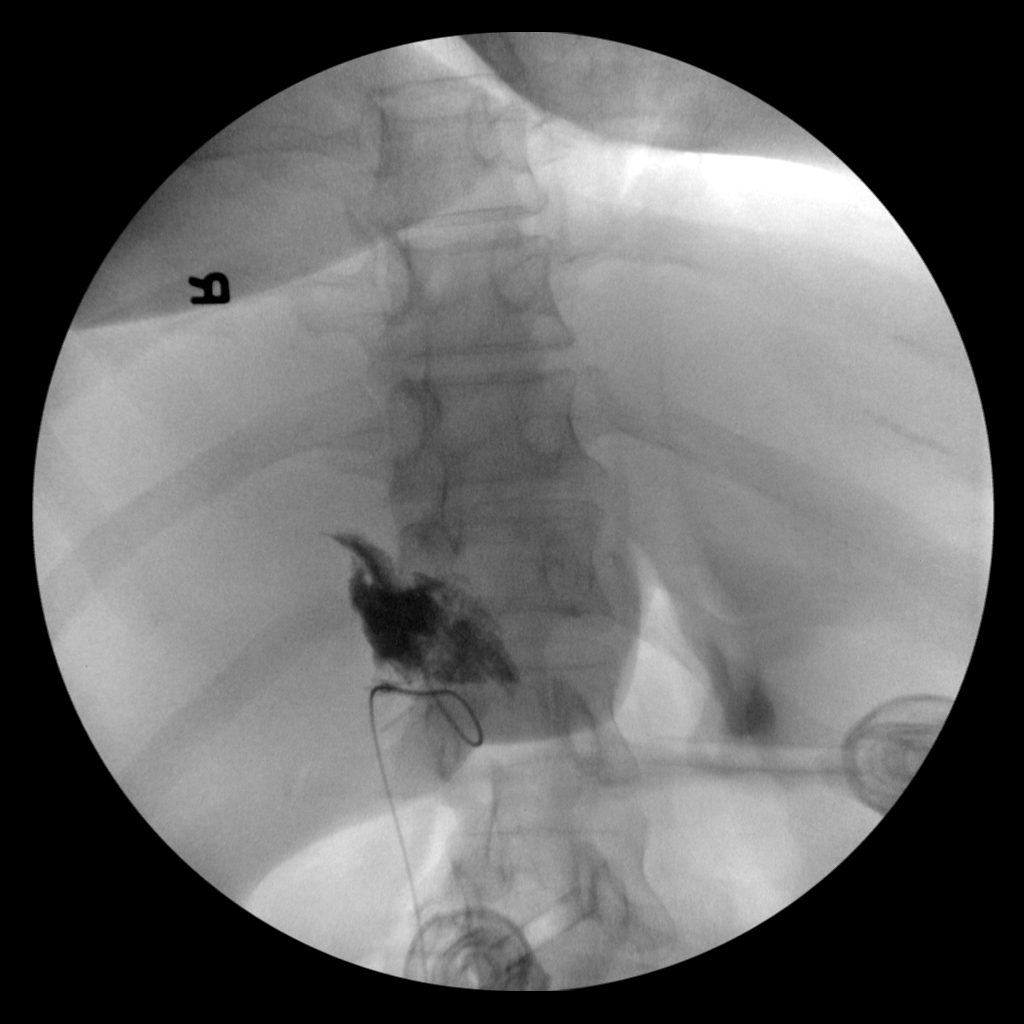
[im 2/2]
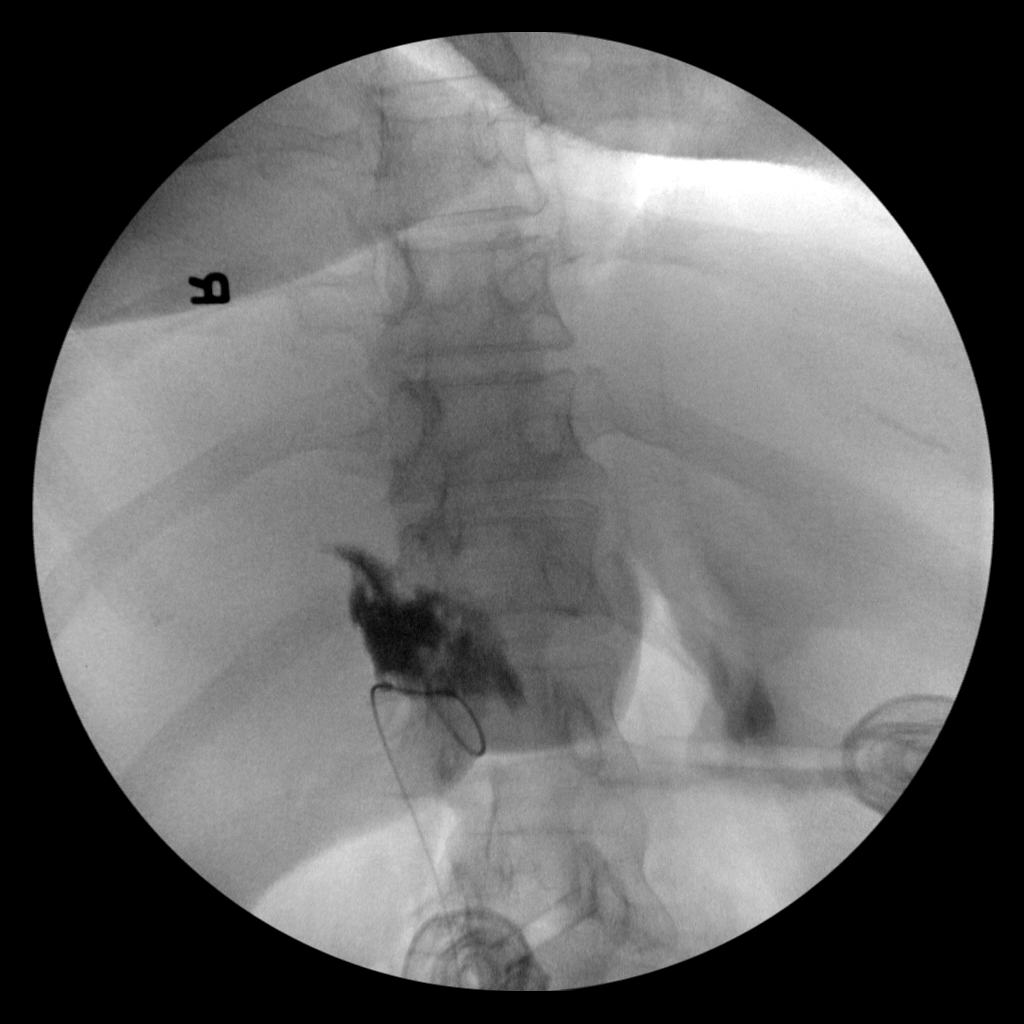

[4 of 4 positions shown; findings below may reference images not displayed]

FINDINGS: The contrast injection results in extravasated contrast. There is no
contrast within the biliary tree or duodenum.
IMPRESSION: See above.

## 2019-04-11 SURGERY — LAPAROSCOPIC CHOLECYSTECTOMY WITH INTRAOPERATIVE CHOLANGIOGRAM
Anesthesia: General | Site: Abdomen

## 2019-04-11 MED ORDER — HYDROMORPHONE HCL 1 MG/ML IJ SOLN
0.2500 mg | INTRAMUSCULAR | Status: DC | PRN
  Administered 2019-04-11 (×4): 0.5 mg via INTRAVENOUS

## 2019-04-11 MED ORDER — SODIUM CHLORIDE 0.9 % IV SOLN
INTRAVENOUS | Status: DC | PRN
  Administered 2019-04-11: 10 mL

## 2019-04-11 MED ORDER — MIDAZOLAM HCL 2 MG/2ML IJ SOLN
INTRAMUSCULAR | Status: AC
  Filled 2019-04-11: qty 2

## 2019-04-11 MED ORDER — PHENYLEPHRINE 40 MCG/ML (10ML) SYRINGE FOR IV PUSH (FOR BLOOD PRESSURE SUPPORT)
PREFILLED_SYRINGE | INTRAVENOUS | Status: DC | PRN
  Administered 2019-04-11: 120 ug via INTRAVENOUS

## 2019-04-11 MED ORDER — HYDROMORPHONE HCL 1 MG/ML IJ SOLN
1.0000 mg | INTRAMUSCULAR | Status: DC | PRN
  Administered 2019-04-11 – 2019-04-16 (×6): 1 mg via INTRAVENOUS
  Filled 2019-04-11 (×7): qty 1

## 2019-04-11 MED ORDER — SODIUM CHLORIDE 0.9 % IR SOLN
Status: DC | PRN
  Administered 2019-04-11: 1000 mL

## 2019-04-11 MED ORDER — FENTANYL CITRATE (PF) 250 MCG/5ML IJ SOLN
INTRAMUSCULAR | Status: DC | PRN
  Administered 2019-04-11: 50 ug via INTRAVENOUS
  Administered 2019-04-11: 100 ug via INTRAVENOUS

## 2019-04-11 MED ORDER — DEXTROSE-NACL 5-0.9 % IV SOLN
INTRAVENOUS | Status: DC
  Administered 2019-04-11: 14:00:00 via INTRAVENOUS

## 2019-04-11 MED ORDER — HYDROMORPHONE HCL 1 MG/ML IJ SOLN
INTRAMUSCULAR | Status: AC
  Filled 2019-04-11: qty 1

## 2019-04-11 MED ORDER — BUPIVACAINE HCL 0.25 % IJ SOLN
INTRAMUSCULAR | Status: DC | PRN
  Administered 2019-04-11: 7 mL

## 2019-04-11 MED ORDER — LIDOCAINE 2% (20 MG/ML) 5 ML SYRINGE
INTRAMUSCULAR | Status: DC | PRN
  Administered 2019-04-11: 50 mg via INTRAVENOUS

## 2019-04-11 MED ORDER — ROCURONIUM BROMIDE 50 MG/5ML IV SOSY
PREFILLED_SYRINGE | INTRAVENOUS | Status: AC
  Filled 2019-04-11: qty 5

## 2019-04-11 MED ORDER — OXYCODONE HCL 5 MG PO TABS
5.0000 mg | ORAL_TABLET | ORAL | Status: DC | PRN
  Administered 2019-04-11: 10 mg via ORAL
  Administered 2019-04-12: 08:00:00 5 mg via ORAL
  Administered 2019-04-12 (×3): 10 mg via ORAL
  Administered 2019-04-13: 16:00:00 5 mg via ORAL
  Administered 2019-04-14: 23:00:00 10 mg via ORAL
  Filled 2019-04-11: qty 2
  Filled 2019-04-11: qty 1
  Filled 2019-04-11 (×4): qty 2
  Filled 2019-04-11: qty 1

## 2019-04-11 MED ORDER — 0.9 % SODIUM CHLORIDE (POUR BTL) OPTIME
TOPICAL | Status: DC | PRN
  Administered 2019-04-11: 1000 mL

## 2019-04-11 MED ORDER — DEXAMETHASONE SODIUM PHOSPHATE 10 MG/ML IJ SOLN
INTRAMUSCULAR | Status: DC | PRN
  Administered 2019-04-11: 5 mg via INTRAVENOUS

## 2019-04-11 MED ORDER — ROCURONIUM BROMIDE 10 MG/ML (PF) SYRINGE
PREFILLED_SYRINGE | INTRAVENOUS | Status: DC | PRN
  Administered 2019-04-11 (×2): 20 mg via INTRAVENOUS

## 2019-04-11 MED ORDER — ONDANSETRON HCL 4 MG/2ML IJ SOLN
INTRAMUSCULAR | Status: DC | PRN
  Administered 2019-04-11: 4 mg via INTRAVENOUS

## 2019-04-11 MED ORDER — ACETAMINOPHEN 160 MG/5ML PO SOLN: 325.0000 mg | Freq: Once | ORAL | Status: DC

## 2019-04-11 MED ORDER — PROPOFOL 10 MG/ML IV BOLUS
INTRAVENOUS | Status: DC | PRN
  Administered 2019-04-11: 140 mg via INTRAVENOUS

## 2019-04-11 MED ORDER — ACETAMINOPHEN 325 MG PO TABS: 325.0000 mg | ORAL_TABLET | Freq: Once | ORAL | Status: DC

## 2019-04-11 MED ORDER — ONDANSETRON HCL 4 MG/2ML IJ SOLN
4.0000 mg | Freq: Four times a day (QID) | INTRAMUSCULAR | Status: DC | PRN
  Administered 2019-04-11: 14:00:00 4 mg via INTRAVENOUS
  Filled 2019-04-11: qty 2

## 2019-04-11 MED ORDER — FENTANYL CITRATE (PF) 250 MCG/5ML IJ SOLN
INTRAMUSCULAR | Status: AC
  Filled 2019-04-11: qty 5

## 2019-04-11 MED ORDER — PROMETHAZINE HCL 25 MG/ML IJ SOLN: 6.2500 mg | INTRAMUSCULAR | Status: DC | PRN

## 2019-04-11 MED ORDER — SUCCINYLCHOLINE CHLORIDE 200 MG/10ML IV SOSY
PREFILLED_SYRINGE | INTRAVENOUS | Status: AC
  Filled 2019-04-11: qty 10

## 2019-04-11 MED ORDER — ACETAMINOPHEN 10 MG/ML IV SOLN
1000.0000 mg | Freq: Once | INTRAVENOUS | Status: DC | PRN
  Administered 2019-04-11: 1000 mg via INTRAVENOUS

## 2019-04-11 MED ORDER — LIDOCAINE 2% (20 MG/ML) 5 ML SYRINGE
INTRAMUSCULAR | Status: AC
  Filled 2019-04-11: qty 5

## 2019-04-11 MED ORDER — SUCCINYLCHOLINE CHLORIDE 200 MG/10ML IV SOSY
PREFILLED_SYRINGE | INTRAVENOUS | Status: DC | PRN
  Administered 2019-04-11: 120 mg via INTRAVENOUS

## 2019-04-11 MED ORDER — LACTATED RINGERS IV SOLN: INTRAVENOUS | Status: DC

## 2019-04-11 MED ORDER — ONDANSETRON HCL 4 MG/2ML IJ SOLN
INTRAMUSCULAR | Status: AC
  Filled 2019-04-11: qty 2

## 2019-04-11 MED ORDER — PHENYLEPHRINE 40 MCG/ML (10ML) SYRINGE FOR IV PUSH (FOR BLOOD PRESSURE SUPPORT)
PREFILLED_SYRINGE | INTRAVENOUS | Status: AC
  Filled 2019-04-11: qty 20

## 2019-04-11 MED ORDER — SUGAMMADEX SODIUM 200 MG/2ML IV SOLN
INTRAVENOUS | Status: DC | PRN
  Administered 2019-04-11: 180 mg via INTRAVENOUS

## 2019-04-11 MED ORDER — TRAMADOL HCL 50 MG PO TABS
50.0000 mg | ORAL_TABLET | Freq: Four times a day (QID) | ORAL | Status: DC | PRN
  Administered 2019-04-11 – 2019-04-13 (×2): 50 mg via ORAL
  Filled 2019-04-11 (×2): qty 1

## 2019-04-11 MED ORDER — PROPOFOL 10 MG/ML IV BOLUS
INTRAVENOUS | Status: AC
  Filled 2019-04-11: qty 20

## 2019-04-11 MED ORDER — LACTATED RINGERS IV SOLN
INTRAVENOUS | Status: DC
  Administered 2019-04-11 (×2): via INTRAVENOUS

## 2019-04-11 MED ORDER — DEXAMETHASONE SODIUM PHOSPHATE 10 MG/ML IJ SOLN
INTRAMUSCULAR | Status: AC
  Filled 2019-04-11: qty 1

## 2019-04-11 MED ORDER — ACETAMINOPHEN 10 MG/ML IV SOLN
INTRAVENOUS | Status: AC
  Filled 2019-04-11: qty 100

## 2019-04-11 MED ORDER — ACETAMINOPHEN 325 MG PO TABS
650.0000 mg | ORAL_TABLET | Freq: Four times a day (QID) | ORAL | Status: DC
  Administered 2019-04-11 – 2019-04-15 (×13): 650 mg via ORAL
  Filled 2019-04-11 (×14): qty 2

## 2019-04-11 MED ORDER — MIDAZOLAM HCL 2 MG/2ML IJ SOLN
INTRAMUSCULAR | Status: DC | PRN
  Administered 2019-04-11: 2 mg via INTRAVENOUS

## 2019-04-11 MED ORDER — MEPERIDINE HCL 50 MG/ML IJ SOLN: 6.2500 mg | INTRAMUSCULAR | Status: DC | PRN

## 2019-04-11 MED ORDER — BUPIVACAINE HCL (PF) 0.25 % IJ SOLN
INTRAMUSCULAR | Status: AC
  Filled 2019-04-11: qty 30

## 2019-04-11 SURGICAL SUPPLY — 59 items
ADH SKN CLS APL DERMABOND .7 (GAUZE/BANDAGES/DRESSINGS) ×1
ADH SKN CLS LQ APL DERMABOND (GAUZE/BANDAGES/DRESSINGS) ×1
APPLIER CLIP 5 13 M/L LIGAMAX5 (MISCELLANEOUS)
APR CLP MED LRG 5 ANG JAW (MISCELLANEOUS)
BAG SPEC RTRVL 10 TROC 200 (ENDOMECHANICALS) ×1
CANISTER SUCT 3000ML PPV (MISCELLANEOUS) ×3 IMPLANT
CHLORAPREP W/TINT 26ML (MISCELLANEOUS) ×3 IMPLANT
CLIP APPLIE 5 13 M/L LIGAMAX5 (MISCELLANEOUS) IMPLANT
CLIP VESOLOCK MED LG 6/CT (CLIP) ×3 IMPLANT
COVER MAYO STAND STRL (DRAPES) ×5 IMPLANT
COVER SURGICAL LIGHT HANDLE (MISCELLANEOUS) ×3 IMPLANT
COVER TRANSDUCER ULTRASND (DRAPES) ×3 IMPLANT
COVER WAND RF STERILE (DRAPES) ×3 IMPLANT
DEFOGGER SCOPE WARMER CLEARIFY (MISCELLANEOUS) IMPLANT
DERMABOND ADHESIVE PROPEN (GAUZE/BANDAGES/DRESSINGS) ×2
DERMABOND ADVANCED (GAUZE/BANDAGES/DRESSINGS) ×2
DERMABOND ADVANCED .7 DNX12 (GAUZE/BANDAGES/DRESSINGS) ×1 IMPLANT
DERMABOND ADVANCED .7 DNX6 (GAUZE/BANDAGES/DRESSINGS) IMPLANT
DRAPE C-ARM 42X72 X-RAY (DRAPES) ×3 IMPLANT
ELECT REM PT RETURN 9FT ADLT (ELECTROSURGICAL) ×3
ELECTRODE REM PT RTRN 9FT ADLT (ELECTROSURGICAL) ×1 IMPLANT
ENDOLOOP SUT PDS II  0 18 (SUTURE) ×4
ENDOLOOP SUT PDS II 0 18 (SUTURE) IMPLANT
GLOVE BIO SURGEON STRL SZ7.5 (GLOVE) ×3 IMPLANT
GLOVE BIOGEL PI IND STRL 6.5 (GLOVE) IMPLANT
GLOVE BIOGEL PI INDICATOR 6.5 (GLOVE) ×6
GLOVE SURG SS PI 6.0 STRL IVOR (GLOVE) ×4 IMPLANT
GOWN STRL REUS W/ TWL LRG LVL3 (GOWN DISPOSABLE) ×2 IMPLANT
GOWN STRL REUS W/ TWL XL LVL3 (GOWN DISPOSABLE) ×1 IMPLANT
GOWN STRL REUS W/TWL LRG LVL3 (GOWN DISPOSABLE) ×6
GOWN STRL REUS W/TWL XL LVL3 (GOWN DISPOSABLE) ×3
GRASPER SUT TROCAR 14GX15 (MISCELLANEOUS) ×3 IMPLANT
IV CATH 14GX2 1/4 (CATHETERS) ×3 IMPLANT
KIT BASIN OR (CUSTOM PROCEDURE TRAY) ×3 IMPLANT
KIT TURNOVER KIT B (KITS) ×3 IMPLANT
NDL INSUFFLATION 14GA 120MM (NEEDLE) ×1 IMPLANT
NEEDLE INSUFFLATION 14GA 120MM (NEEDLE) ×3 IMPLANT
NS IRRIG 1000ML POUR BTL (IV SOLUTION) ×3 IMPLANT
PAD ARMBOARD 7.5X6 YLW CONV (MISCELLANEOUS) ×6 IMPLANT
POUCH LAPAROSCOPIC INSTRUMENT (MISCELLANEOUS) ×3 IMPLANT
POUCH RETRIEVAL ECOSAC 10 (ENDOMECHANICALS) IMPLANT
POUCH RETRIEVAL ECOSAC 10MM (ENDOMECHANICALS) ×2
SCISSORS LAP 5X35 DISP (ENDOMECHANICALS) ×3 IMPLANT
SET CHOLANGIOGRAPHY FRANKLIN (SET/KITS/TRAYS/PACK) ×5 IMPLANT
SET IRRIG TUBING LAPAROSCOPIC (IRRIGATION / IRRIGATOR) ×3 IMPLANT
SET TUBE SMOKE EVAC HIGH FLOW (TUBING) ×3 IMPLANT
SLEEVE ENDOPATH XCEL 5M (ENDOMECHANICALS) ×5 IMPLANT
SPECIMEN JAR SMALL (MISCELLANEOUS) ×3 IMPLANT
STOPCOCK 4 WAY LG BORE MALE ST (IV SETS) ×5 IMPLANT
SUT MNCRL AB 4-0 PS2 18 (SUTURE) ×3 IMPLANT
SUT VIC AB 0 CT1 27 (SUTURE) ×3
SUT VIC AB 0 CT1 27XBRD ANBCTR (SUTURE) IMPLANT
TAPE UMBILICAL COTTON 1/8X30 (MISCELLANEOUS) ×2 IMPLANT
TOWEL OR 17X24 6PK STRL BLUE (TOWEL DISPOSABLE) ×3 IMPLANT
TOWEL OR 17X26 10 PK STRL BLUE (TOWEL DISPOSABLE) ×3 IMPLANT
TRAY LAPAROSCOPIC MC (CUSTOM PROCEDURE TRAY) ×3 IMPLANT
TROCAR XCEL NON-BLD 11X100MML (ENDOMECHANICALS) ×3 IMPLANT
TROCAR XCEL NON-BLD 5MMX100MML (ENDOMECHANICALS) ×3 IMPLANT
WATER STERILE IRR 1000ML POUR (IV SOLUTION) ×3 IMPLANT

## 2019-04-11 NOTE — Progress Notes (Signed)
previous order stated, clear to regular diet. patient tolerated clear well, voided. ordering regular diet.

## 2019-04-11 NOTE — Anesthesia Procedure Notes (Signed)
Procedure Name: Intubation Date/Time: 04/11/2019 10:10 AM Performed by: Mayer Camel, CRNA Pre-anesthesia Checklist: Patient identified, Emergency Drugs available, Suction available and Patient being monitored Patient Re-evaluated:Patient Re-evaluated prior to induction Oxygen Delivery Method: Circle System Utilized Preoxygenation: Pre-oxygenation with 100% oxygen Induction Type: IV induction and Rapid sequence Ventilation: Mask ventilation without difficulty Laryngoscope Size: Miller and 2 Grade View: Grade I Tube type: Oral Tube size: 7.0 mm Number of attempts: 1 Airway Equipment and Method: Stylet and Oral airway Placement Confirmation: ETT inserted through vocal cords under direct vision,  positive ETCO2 and breath sounds checked- equal and bilateral Secured at: 21 cm Tube secured with: Tape Dental Injury: Teeth and Oropharynx as per pre-operative assessment

## 2019-04-11 NOTE — Anesthesia Preprocedure Evaluation (Addendum)
Anesthesia Evaluation  Patient identified by MRN, date of birth, ID band Patient awake    Reviewed: Allergy & Precautions, NPO status , Patient's Chart, lab work & pertinent test results  Airway Mallampati: III  TM Distance: >3 FB Neck ROM: Full    Dental  (+) Teeth Intact, Dental Advisory Given   Pulmonary neg pulmonary ROS,    Pulmonary exam normal        Cardiovascular negative cardio ROS   Rhythm:Regular Rate:Normal     Neuro/Psych Depression negative neurological ROS     GI/Hepatic Neg liver ROS, GERD  ,  Endo/Other  diabetes  Renal/GU negative Renal ROS  negative genitourinary   Musculoskeletal negative musculoskeletal ROS (+)   Abdominal (+) + obese,  Abdomen: tender.    Peds  Hematology negative hematology ROS (+)   Anesthesia Other Findings   Reproductive/Obstetrics                            Anesthesia Physical Anesthesia Plan  ASA: III  Anesthesia Plan: General   Post-op Pain Management:    Induction: Intravenous  PONV Risk Score and Plan: 4 or greater and Ondansetron, Dexamethasone, Midazolam and Scopolamine patch - Pre-op  Airway Management Planned: Oral ETT  Additional Equipment: None  Intra-op Plan:   Post-operative Plan: Extubation in OR  Informed Consent: I have reviewed the patients History and Physical, chart, labs and discussed the procedure including the risks, benefits and alternatives for the proposed anesthesia with the patient or authorized representative who has indicated his/her understanding and acceptance.     Dental advisory given  Plan Discussed with: CRNA  Anesthesia Plan Comments:        Anesthesia Quick Evaluation

## 2019-04-11 NOTE — Anesthesia Postprocedure Evaluation (Signed)
Anesthesia Post Note  Patient: Carolyn Mendez  Procedure(s) Performed: LAPAROSCOPIC CHOLECYSTECTOMY WITH INTRAOPERATIVE CHOLANGIOGRAM (N/A Abdomen)     Patient location during evaluation: PACU Anesthesia Type: General Level of consciousness: awake and alert Pain management: pain level controlled Vital Signs Assessment: post-procedure vital signs reviewed and stable Respiratory status: spontaneous breathing, nonlabored ventilation, respiratory function stable and patient connected to nasal cannula oxygen Cardiovascular status: blood pressure returned to baseline and stable Postop Assessment: no apparent nausea or vomiting Anesthetic complications: no    Last Vitals:  Vitals:   04/11/19 1245 04/11/19 1300  BP: 125/79 121/74  Pulse: 75 70  Resp: 16 10  Temp: 36.4 C   SpO2: 95% 97%    Last Pain:  Vitals:   04/11/19 1315  TempSrc:   PainSc: Asleep                 Shelton Silvas

## 2019-04-11 NOTE — Progress Notes (Signed)
Patient has a gold ring on her right finger. Finger appears swollen. OR RN informed. Will take it out in the OR.

## 2019-04-11 NOTE — OR Nursing (Signed)
Pt arrived to OR with a gold colored ring with three dolphins on the right ring finger. Ring removed by Tanda Rockers, RN and placed on white umbilical tape with pt sticker. After operative procedure, the ring and white umbilical tape with pt sticker was placed around pts neck as a necklace. Pt was taken to PACU with ring. PACU RN was notified of ring.

## 2019-04-11 NOTE — Op Note (Signed)
04/11/2019  11:29 AM  PATIENT:  Carolyn Mendez  43 y.o. female  PRE-OPERATIVE DIAGNOSIS:  Acute cholecystitis, cholelithiasis  POST-OPERATIVE DIAGNOSIS:  same  PROCEDURE:  Procedure(s): LAPAROSCOPIC CHOLECYSTECTOMY WITH INTRAOPERATIVE CHOLANGIOGRAM (N/A)  SURGEON:  Surgeon(s) and Role:    * Axel Filler, MD - Primary  ANESTHESIA:   local and general  EBL:  minimal   BLOOD ADMINISTERED:none  DRAINS: none   LOCAL MEDICATIONS USED:  BUPIVICAINE   SPECIMEN:  Source of Specimen:  gallbladder  DISPOSITION OF SPECIMEN:  PATHOLOGY  COUNTS:  YES  TOURNIQUET:  * No tourniquets in log *  DICTATION: .Dragon Dictation The patient was taken to the operating and placed in the supine position with bilateral SCDs in place. The patient was prepped and draped in the usual sterile fashion. A time out was called and all facts were verified. A pneumoperitoneum was obtained via A Veress needle technique to a pressure of 37mm of mercury.  A 55mm trochar was then placed in the right upper quadrant under visualization, and there were no injuries to any abdominal organs. A 11 mm port was then placed in the umbilical region after infiltrating with local anesthesia under direct visualization. A second and third epigastric port and right lower quadrant port placement under direct visualization, respectively.    The gallbladder was identified and retracted, the peritoneum was then sharply dissected from the gallbladder and this dissection was carried down to Calot's triangle. The gallbladder was identified and stripped away circumferentially and seen going into the gallbladder 360, the critical angle was obtained. A Cook catheter was used to perform an intraoperative cholangiogram. The cholangiogram showed some leaking.  Upon further dissection it appeared the catheter back-walled the cystic duct just past the cystic duct-common bile duct junction.  The catheter was withdrawn.  Endoloops x 2 were  placed at the cystic duct junction.  There appeared not to be drainage of bile after the endoloops were placed. The cystic artery was identified and 2 clips placed proximally and one distally and transected.   We then proceeded to remove the gallbladder off the hepatic fossa with Bovie cautery. A retrieval bag was then placed in the abdomen and gallbladder placed in the bag. The hepatic fossa was then reexamined and hemostasis was achieved with Bovie cautery and was excellent at the end of the case. The subhepatic fossa and perihepatic fossa was then irrigated until the effluent was clear. The 11 mm trocar fascia was reapproximated with the PMI & a #1 Vicryl. The pneumoperitoneum was evacuated and all trochars removed under direct visulalization. The skin was then closed with 4-0 Monocryl and the skin dressed with Dermabond. The patient was awaken from general anesthesia and taken to the recovery room in stable condition.    PLAN OF CARE: Admit for overnight observation  PATIENT DISPOSITION:  PACU - hemodynamically stable.   Delay start of Pharmacological VTE agent (>24hrs) due to surgical blood loss or risk of bleeding: not applicable

## 2019-04-11 NOTE — Transfer of Care (Signed)
Immediate Anesthesia Transfer of Care Note  Patient: Carolyn Mendez  Procedure(s) Performed: LAPAROSCOPIC CHOLECYSTECTOMY WITH INTRAOPERATIVE CHOLANGIOGRAM (N/A Abdomen)  Patient Location: PACU  Anesthesia Type:General  Level of Consciousness: awake, alert  and oriented  Airway & Oxygen Therapy: Patient Spontanous Breathing and Patient connected to face mask oxygen  Post-op Assessment: Report given to RN and Post -op Vital signs reviewed and stable  Post vital signs: Reviewed and stable  Last Vitals:  Vitals Value Taken Time  BP 124/73 04/11/2019 11:58 AM  Temp    Pulse 73 04/11/2019 11:59 AM  Resp 15 04/11/2019 11:59 AM  SpO2 92 % 04/11/2019 11:59 AM  Vitals shown include unvalidated device data.  Last Pain:  Vitals:   04/11/19 1150  TempSrc:   PainSc: 7       Patients Stated Pain Goal: 2 (04/11/19 0916)  Complications: No apparent anesthesia complications

## 2019-04-12 ENCOUNTER — Encounter (HOSPITAL_COMMUNITY): Payer: Self-pay | Admitting: General Surgery

## 2019-04-12 DIAGNOSIS — R748 Abnormal levels of other serum enzymes: Secondary | ICD-10-CM | POA: Diagnosis not present

## 2019-04-12 DIAGNOSIS — K8020 Calculus of gallbladder without cholecystitis without obstruction: Secondary | ICD-10-CM

## 2019-04-12 DIAGNOSIS — Z9049 Acquired absence of other specified parts of digestive tract: Secondary | ICD-10-CM

## 2019-04-12 LAB — CBC
HCT: 35.6 % — ABNORMAL LOW (ref 36.0–46.0)
Hemoglobin: 11.9 g/dL — ABNORMAL LOW (ref 12.0–15.0)
MCH: 28.1 pg (ref 26.0–34.0)
MCHC: 33.4 g/dL (ref 30.0–36.0)
MCV: 84 fL (ref 80.0–100.0)
Platelets: 260 10*3/uL (ref 150–400)
RBC: 4.24 MIL/uL (ref 3.87–5.11)
RDW: 12 % (ref 11.5–15.5)
WBC: 7 10*3/uL (ref 4.0–10.5)
nRBC: 0 % (ref 0.0–0.2)

## 2019-04-12 LAB — COMPREHENSIVE METABOLIC PANEL
ALT: 772 U/L — ABNORMAL HIGH (ref 0–44)
AST: 405 U/L — ABNORMAL HIGH (ref 15–41)
Albumin: 3.5 g/dL (ref 3.5–5.0)
Alkaline Phosphatase: 158 U/L — ABNORMAL HIGH (ref 38–126)
Anion gap: 5 (ref 5–15)
BUN: 6 mg/dL (ref 6–20)
CO2: 28 mmol/L (ref 22–32)
Calcium: 8.6 mg/dL — ABNORMAL LOW (ref 8.9–10.3)
Chloride: 102 mmol/L (ref 98–111)
Creatinine, Ser: 0.48 mg/dL (ref 0.44–1.00)
GFR calc Af Amer: >60 mL/min (ref >60)
GFR calc non Af Amer: >60 mL/min (ref >60)
Glucose, Bld: 170 mg/dL — ABNORMAL HIGH (ref 70–99)
Potassium: 3.4 mmol/L — ABNORMAL LOW (ref 3.5–5.1)
Sodium: 135 mmol/L (ref 135–145)
Total Bilirubin: 3.2 mg/dL — ABNORMAL HIGH (ref 0.3–1.2)
Total Protein: 6.6 g/dL (ref 6.5–8.1)

## 2019-04-12 MED ORDER — PROMETHAZINE HCL 25 MG/ML IJ SOLN: 25.0000 mg | Freq: Four times a day (QID) | INTRAMUSCULAR | Status: DC | PRN

## 2019-04-12 MED ORDER — METHOCARBAMOL 500 MG PO TABS: 500.0000 mg | ORAL_TABLET | Freq: Three times a day (TID) | ORAL | Status: DC | PRN

## 2019-04-12 MED ORDER — SIMETHICONE 80 MG PO CHEW: 80.0000 mg | CHEWABLE_TABLET | Freq: Four times a day (QID) | ORAL | Status: DC | PRN

## 2019-04-12 MED ORDER — POTASSIUM CHLORIDE CRYS ER 20 MEQ PO TBCR
40.0000 meq | EXTENDED_RELEASE_TABLET | Freq: Once | ORAL | Status: AC
  Administered 2019-04-12: 40 meq via ORAL
  Filled 2019-04-12: qty 2

## 2019-04-12 NOTE — Consult Note (Signed)
Referring Provider: [facility] Surgery Primary Care Physician:  Leeanne Rio, MD Primary Gastroenterologist:  unassigned     Reason for Consultation: abnormal liver tests post cholecystectomy    ASSESSMENT / PLAN:     48. 43 yo Non-English speaking female with symptomatic cholelithiasis, s/p lap cholecystectomy yesterday am.   2. Markedly abnormal liver tests ( normal at baseline). Enzymes improved overnight but slight worsening of total bili and alk phos. Pre-operative MRCP negative for biliary duct dilation / choledocholithiasis though study degraded by motion artifact.     3. Mild hypokalemia.  -will give 57mq K x1 now in case ERCP necessary.   4. GERD. Required acid blockers during pregnancy. Now takes as needed which is mainly with consumption of spicy foods.   HPI:    Carolyn Neidlingeris a 43y.o. female, non-English speaking, who presented to ED 04/10/19 with RUQ pain / nausea and abnormal liver tests.  History is obtained via interpreter in the room. Ultrasound confirmed cholelithiasis without evidence for acute cholecystitis.  There was mild common bile duct dilation.  MRCP obtained, unfortunately it was a degraded exam secondary to artifact.  CBD measured 5 to 6 mm.  Patient taken for laparoscopic cholecystectomy yesterday.  IOC was attempted but unsuccessful due to extravasation of the contrast. Her transaminases have improved but with slight rise in alk phos and bilirubin overnight. At baseline her liver tests are normal.  She has ongoing abdominal pain but also complains of low back pain and chest pain. She is afebrile with normal WBC.       Past Medical History:  Diagnosis Date   Diabetes mellitus without complication ([place]    GERD (gastroesophageal reflux disease)     Past Surgical History:  Procedure Laterality Date   CESAREAN SECTION     VBAC x2   CESAREAN SECTION N/A 03/15/2018   Procedure: CESAREAN SECTION;  Surgeon: DSloan Leiter MD;   [place]  Service: Obstetrics;  Laterality: N/A;   CHOLECYSTECTOMY N/A 04/11/2019   Procedure: LAPAROSCOPIC CHOLECYSTECTOMY WITH INTRAOPERATIVE CHOLANGIOGRAM;  Surgeon: RRalene Ok MD;  [place]  Service: General;  Laterality: N/A;    Prior to Admission medications   Medication Sig Start Date End Date Taking? Authorizing Provider  Prenatal Vit-Fe Fumarate-FA (PREPLUS) 27-1 MG TABS Take 1 tablet daily by mouth.   Yes [provider]  predniSONE (DELTASONE) 20 MG tablet Take 1 tablet (20 mg total) by mouth daily with breakfast. Two daily with food Patient not taking: Reported on 04/10/2019 10/22/18   LRobyn Haber MD    Current Facility-Administered Medications  Medication Dose Route Frequency Provider Last Rate Last Dose   acetaminophen (TYLENOL) tablet 650 mg  650 mg Oral Q6H RRalene Ok MD   650 mg at 04/12/19 0824   dextrose 5 %-0.9 % sodium chloride infusion   Intravenous Continuous RRalene Ok MD 100 mL/hr at 04/12/19 0531     HYDROmorphone (DILAUDID) injection 1 mg  1 mg Intravenous Q2H PRN RRalene Ok MD   1 mg at 04/11/19 1848   methocarbamol (ROBAXIN) tablet 500 mg  500 mg Oral Q8H PRN Meuth, Brooke A, PA-C       ondansetron (ZOFRAN) injection 4 mg  4 mg Intravenous Q6H PRN RRalene Ok MD   4 mg at 04/11/19 1422   oxyCODONE (Oxy IR/ROXICODONE) immediate release tablet 5-10 mg  5-10 mg Oral Q4H PRN RRalene Ok MD   5 mg at 04/12/19 0824   promethazine (PHENERGAN) injection  25 mg  25 mg Intravenous Q6H PRN Meuth, Brooke A, PA-C       simethicone (MYLICON) chewable tablet 80 mg  80 mg Oral QID PRN Meuth, Brooke A, PA-C       traMADol (ULTRAM) tablet 50 mg  50 mg Oral Q6H PRN Ralene Ok, MD   50 mg at 04/11/19 2032    Allergies as of 04/10/2019   (No Known Allergies)    Family History  Problem Relation Age of Onset   Hyperlipidemia Mother    Kidney disease Mother    Hyperlipidemia  Sister    Osteoporosis Sister     Social History   Socioeconomic History   Marital status: Single    Spouse name: Not on file   Number of children: Not on file   Years of education: Not on file   Highest education level: Not on file  Occupational History   Not on file  Social Needs   Financial resource strain: Not on file   Food insecurity:    Worry: Not on file    Inability: Not on file   Transportation needs:    Medical: Not on file    Non-medical: Not on file  Tobacco Use   Smoking status: Never Smoker   Smokeless tobacco: Never Used  Substance and Sexual Activity   Alcohol use: No   Drug use: No   Sexual activity: Not Currently    Birth control/protection: None  Lifestyle   Physical activity:    Days per week: Not on file    Minutes per session: Not on file   Stress: Not on file  Relationships   Social connections:    Talks on phone: Not on file    Gets together: Not on file    Attends religious service: Not on file    Active member of club or organization: Not on file    Attends meetings of clubs or organizations: Not on file    Relationship status: Not on file   Intimate partner violence:    Fear of current or ex partner: Not on file    Emotionally abused: Not on file    Physically abused: Not on file    Forced sexual activity: Not on file  Other Topics Concern   Not on file  Social History Narrative   Not on file    Review of Systems: All systems reviewed and negative except where noted in HPI.  Physical Exam: Vital signs in last 24 hours: Temp:  [97 F (36.1 C)-99 F (37.2 C)] 98.7 F (37.1 C) (04/17 0821) Pulse Rate:  [61-82] 64 (04/17 0821) Resp:  [10-20] 15 (04/17 0821) BP: (112-129)/(67-83) 119/72 (04/17 0821) SpO2:  [93 %-98 %] 98 % (04/17 0821) Last BM Date: 04/10/19 General:   Alert, well-developed,  female in NAD Psych:  Pleasant, cooperative. Normal mood and affect. Eyes:  Pupils equal, conjunctiva pink. Ears:   Normal auditory acuity. Nose:  No deformity, discharge,  or lesions. Neck:  Supple; no masses Lungs:  Clear throughout to auscultation.   No wheezes, crackles, or rhonchi.  Heart:  Regular rate and rhythm; no murmurs, no lower extremity edema Abdomen:  Soft, non-distended, mild tenderness but only around surgical ports    Rectal:  Deferred  Msk:  Symmetrical without gross deformities. . Neurologic:  Alert and  oriented x4;  grossly normal neurologically. Skin:  Intact without significant lesions or rashes.   Intake/Output from previous day: 04/16 0701 - 04/17 0700 In: 3012.7 [P.O.:240;  I.V.:2772.7] Out: 20 [Blood:20] Intake/Output this shift: Total I/O In: 240 [P.O.:240] Out: -   Lab Results: Recent Labs    04/10/19 1543 04/11/19 0330 04/12/19 0041  WBC 9.8 3.9* 7.0  HGB 12.5 12.0 11.9*  HCT 39.4 37.7 35.6*  PLT 284 282 260   BMET Recent Labs    04/10/19 1543 04/11/19 0330 04/12/19 0041  NA 138 138 135  K 3.6 3.8 3.4*  CL 102 108 102  CO2 26 20* 28  GLUCOSE 192* 139* 170*  BUN _0 CREATININE 0.63 0.46 0.48  CALCIUM 9.3 8.8* 8.6*   LFT Recent Labs    04/12/19 0041  PROT 6.6  ALBUMIN 3.5  AST 405*  ALT 772*  ALKPHOS 158*  BILITOT 3.2*    Studies/Results: Dg Cholangiogram Operative  Result Date: 04/11/2019 CLINICAL DATA:  Intraoperative cholangiogram EXAM: INTRAOPERATIVE CHOLANGIOGRAM TECHNIQUE: Cholangiographic images from the C-arm fluoroscopic device were submitted for interpretation post-operatively. Please see the procedural report for the amount of contrast and the fluoroscopy time utilized. COMPARISON:  None. FINDINGS: The contrast injection results in extravasated contrast. There is no contrast within the biliary tree or duodenum. IMPRESSION: See above. Electronically Signed   By: Marybelle Killings M.D.   On: 04/11/2019 11:26   Mr 3d Recon At Scanner  Result Date: 04/11/2019 CLINICAL DATA:  Right upper quadrant pain and nausea for several days.  Cholelithiasis. Mild biliary ductal dilatation on ultrasound. EXAM: MRI ABDOMEN WITHOUT AND WITH CONTRAST (INCLUDING MRCP) TECHNIQUE: Multiplanar multisequence MR imaging of the abdomen was performed both before and after the administration of intravenous contrast. Heavily T2-weighted images of the biliary and pancreatic ducts were obtained, and three-dimensional MRCP images were rendered by post processing. CONTRAST:  9 mL Gadavist COMPARISON:  Ultrasound of 04/10/2019 FINDINGS: Lower chest: No acute findings. Hepatobiliary: No hepatic masses identified. Diffuse hepatic steatosis is demonstrated. Small gallstones and gallbladder sludge are seen. Gallbladder is distended and mild wall thickening is seen although there is no evidence of pericholecystic inflammatory changes or fluid. MRCP images are degraded by motion artifact. Common bile duct measures 5-6 mm, which is at the upper limits of normal. No evidence of choledocholithiasis or biliary stricture. Pancreas: No mass or inflammatory changes. No evidence of pancreatic ductal dilatation or pancreas divisum. Spleen:  Within normal limits in size and appearance. Adrenals/Urinary Tract: No masses identified. No evidence of hydronephrosis. Stomach/Bowel: Visualized portion unremarkable. Vascular/Lymphatic: No pathologically enlarged lymph nodes identified. No abdominal aortic aneurysm. Other:  None. Musculoskeletal:  No suspicious bone lesions identified. IMPRESSION: Cholelithiasis and gallbladder sludge. Mildly distended gallbladder with mild wall thickening, which may be due to early acute cholecystitis. Recommend clinical correlation, and consider nuclear medicine hepatobiliary scan if warranted. MRCP image degradation by motion artifact, however no significant biliary ductal dilatation or choledocholithiasis visualized. Diffuse hepatic steatosis. Electronically Signed   By: Earle Gell M.D.   On: 04/11/2019 07:39   Mr Abdomen Mrcp Moise Boring Contast  Result Date:  04/11/2019 CLINICAL DATA:  Right upper quadrant pain and nausea for several days. Cholelithiasis. Mild biliary ductal dilatation on ultrasound. EXAM: MRI ABDOMEN WITHOUT AND WITH CONTRAST (INCLUDING MRCP) TECHNIQUE: Multiplanar multisequence MR imaging of the abdomen was performed both before and after the administration of intravenous contrast. Heavily T2-weighted images of the biliary and pancreatic ducts were obtained, and three-dimensional MRCP images were rendered by post processing. CONTRAST:  9 mL Gadavist COMPARISON:  Ultrasound of 04/10/2019 FINDINGS: Lower chest: No acute findings. Hepatobiliary: No hepatic masses identified. Diffuse hepatic  steatosis is demonstrated. Small gallstones and gallbladder sludge are seen. Gallbladder is distended and mild wall thickening is seen although there is no evidence of pericholecystic inflammatory changes or fluid. MRCP images are degraded by motion artifact. Common bile duct measures 5-6 mm, which is at the upper limits of normal. No evidence of choledocholithiasis or biliary stricture. Pancreas: No mass or inflammatory changes. No evidence of pancreatic ductal dilatation or pancreas divisum. Spleen:  Within normal limits in size and appearance. Adrenals/Urinary Tract: No masses identified. No evidence of hydronephrosis. Stomach/Bowel: Visualized portion unremarkable. Vascular/Lymphatic: No pathologically enlarged lymph nodes identified. No abdominal aortic aneurysm. Other:  None. Musculoskeletal:  No suspicious bone lesions identified. IMPRESSION: Cholelithiasis and gallbladder sludge. Mildly distended gallbladder with mild wall thickening, which may be due to early acute cholecystitis. Recommend clinical correlation, and consider nuclear medicine hepatobiliary scan if warranted. MRCP image degradation by motion artifact, however no significant biliary ductal dilatation or choledocholithiasis visualized. Diffuse hepatic steatosis. Electronically Signed   By: Earle Gell M.D.   On: 04/11/2019 07:39   US Abdomen Limited Ruq  Result Date: 04/10/2019 CLINICAL DATA:  Right upper quadrant pain for 4 days. EXAM: ULTRASOUND ABDOMEN LIMITED RIGHT UPPER QUADRANT COMPARISON:  CT abdomen and pelvis 12/12/2008 FINDINGS: Gallbladder: Moderate volume sludge in the gallbladder with small stones measuring up to 7 mm. Normal gallbladder wall thickness. No sonographic Murphy sign noted by sonographer. Common bile duct: Diameter: 8 mm, mildly dilated without a common duct stone identified though with inadequate visualization of the distal duct. Liver: Prominently increased parenchymal echogenicity diffusely without focal lesion identified. Portal vein is patent on color Doppler imaging with normal direction of blood flow towards the liver. IMPRESSION: 1. Gallbladder sludge and small stones without evidence of acute cholecystitis. 2. Mild common bile duct dilatation. No obstructing stone identified although the distal duct was not visualized. 3. Echogenic liver suggesting steatosis. Electronically Signed   By: Logan Bores M.D.   On: 04/10/2019 17:33     Tye Savoy, NP-C @  04/12/2019, 11:01 AM

## 2019-04-12 NOTE — Plan of Care (Signed)
  Problem: Health Behavior/Discharge Planning: Goal: Ability to manage health-related needs will improve Outcome: Progressing   Problem: Nutrition: Goal: Adequate nutrition will be maintained Outcome: Progressing   Problem: Coping: Goal: Level of anxiety will decrease Outcome: Progressing   

## 2019-04-12 NOTE — Progress Notes (Signed)
[facility] Progress Note  1 Day Post-Op  Subjective: CC-  Sore this morning. Complaining of abdominal pain, bloating, and nausea. No emesis. No flatus or BM. She was only able to eat 2 bites of grits and sip water with meds this AM. She has ambulated to the bathroom without any issues. Oxycodone 10mg  helped.  Bilirubin up to 3.2 from 2.3 today.  Objective: Vital signs in last 24 hours: Temp:  [97 F (36.1 C)-99 F (37.2 C)] 98.7 F (37.1 C) (04/17 0821) Pulse Rate:  [61-82] 64 (04/17 0821) Resp:  [10-20] 15 (04/17 0821) BP: (112-129)/(67-83) 119/72 (04/17 0821) SpO2:  [93 %-98 %] 98 % (04/17 0821) Last BM Date: 04/10/19  Intake/Output from previous day: 04/16 0701 - 04/17 0700 In: 3012.7 [P.O.:240; I.V.:2772.7] Out: 20 [Blood:20] Intake/Output this shift: Total I/O In: 240 [P.O.:240] Out: -   PE: Gen:  Alert, NAD, pleasant HEENT: EOM's intact, pupils equal and round Card:  RRR Pulm:  CTAB, no W/R/R, effort normal Abd: Soft, mild distension, +BS, lap incisions C/D/I without erythema or drainage, TTP epigastric region and RUQ  Skin: warm and dry  Lab Results:  Recent Labs    04/11/19 0330 04/12/19 0041  WBC 3.9* 7.0  HGB 12.0 11.9*  HCT 37.7 35.6*  PLT 282 260   BMET Recent Labs    04/11/19 0330 04/12/19 0041  NA 138 135  K 3.8 3.4*  CL 108 102  CO2 20* 28  GLUCOSE 139* 170*  BUN 12 6  CREATININE 0.46 0.48  CALCIUM 8.8* 8.6*   PT/INR No results for input(s): LABPROT, INR in the last 72 hours. CMP     Component Value Date/Time   NA 135 04/12/2019 0041   NA 138 11/13/2017 0911   K 3.4 (L) 04/12/2019 0041   CL 102 04/12/2019 0041   CO2 28 04/12/2019 0041   GLUCOSE 170 (H) 04/12/2019 0041   BUN 6 04/12/2019 0041   BUN 8 11/13/2017 0911   CREATININE 0.48 04/12/2019 0041   CREATININE 0.58 02/29/2016 0840   CALCIUM 8.6 (L) 04/12/2019 0041   PROT 6.6 04/12/2019 0041   PROT 6.3 11/13/2017 0911   ALBUMIN 3.5 04/12/2019 0041    ALBUMIN 3.6 11/13/2017 0911   AST 405 (H) 04/12/2019 0041   ALT 772 (H) 04/12/2019 0041   ALKPHOS 158 (H) 04/12/2019 0041   BILITOT 3.2 (H) 04/12/2019 0041   BILITOT 0.4 11/13/2017 0911   GFRNONAA >60 04/12/2019 0041   GFRAA >60 04/12/2019 0041   Lipase     Component Value Date/Time   LIPASE 39 04/10/2019 1543       Studies/Results: Dg Cholangiogram Operative  Result Date: 04/11/2019 CLINICAL DATA:  Intraoperative cholangiogram EXAM: INTRAOPERATIVE CHOLANGIOGRAM TECHNIQUE: Cholangiographic images from the C-arm fluoroscopic device were submitted for interpretation post-operatively. Please see the procedural report for the amount of contrast and the fluoroscopy time utilized. COMPARISON:  None. FINDINGS: The contrast injection results in extravasated contrast. There is no contrast within the biliary tree or duodenum. IMPRESSION: See above. Electronically Signed   By: Jolaine Click M.D.   On: 04/11/2019 11:26   Mr 3d Recon At Scanner  Result Date: 04/11/2019 CLINICAL DATA:  Right upper quadrant pain and nausea for several days. Cholelithiasis. Mild biliary ductal dilatation on ultrasound. EXAM: MRI ABDOMEN WITHOUT AND WITH CONTRAST (INCLUDING MRCP) TECHNIQUE: Multiplanar multisequence MR imaging of the abdomen was performed both before and after the administration of intravenous contrast. Heavily T2-weighted images of the biliary and pancreatic ducts  were obtained, and three-dimensional MRCP images were rendered by post processing. CONTRAST:  9 mL Gadavist COMPARISON:  Ultrasound of 04/10/2019 FINDINGS: Lower chest: No acute findings. Hepatobiliary: No hepatic masses identified. Diffuse hepatic steatosis is demonstrated. Small gallstones and gallbladder sludge are seen. Gallbladder is distended and mild wall thickening is seen although there is no evidence of pericholecystic inflammatory changes or fluid. MRCP images are degraded by motion artifact. Common bile duct measures 5-6 mm, which is at  the upper limits of normal. No evidence of choledocholithiasis or biliary stricture. Pancreas: No mass or inflammatory changes. No evidence of pancreatic ductal dilatation or pancreas divisum. Spleen:  Within normal limits in size and appearance. Adrenals/Urinary Tract: No masses identified. No evidence of hydronephrosis. Stomach/Bowel: Visualized portion unremarkable. Vascular/Lymphatic: No pathologically enlarged lymph nodes identified. No abdominal aortic aneurysm. Other:  None. Musculoskeletal:  No suspicious bone lesions identified. IMPRESSION: Cholelithiasis and gallbladder sludge. Mildly distended gallbladder with mild wall thickening, which may be due to early acute cholecystitis. Recommend clinical correlation, and consider nuclear medicine hepatobiliary scan if warranted. MRCP image degradation by motion artifact, however no significant biliary ductal dilatation or choledocholithiasis visualized. Diffuse hepatic steatosis. Electronically Signed   By: Myles RosenthalJohn  Stahl M.D.   On: 04/11/2019 07:39   Mr Abdomen Mrcp Vivien RossettiW Wo Contast  Result Date: 04/11/2019 CLINICAL DATA:  Right upper quadrant pain and nausea for several days. Cholelithiasis. Mild biliary ductal dilatation on ultrasound. EXAM: MRI ABDOMEN WITHOUT AND WITH CONTRAST (INCLUDING MRCP) TECHNIQUE: Multiplanar multisequence MR imaging of the abdomen was performed both before and after the administration of intravenous contrast. Heavily T2-weighted images of the biliary and pancreatic ducts were obtained, and three-dimensional MRCP images were rendered by post processing. CONTRAST:  9 mL Gadavist COMPARISON:  Ultrasound of 04/10/2019 FINDINGS: Lower chest: No acute findings. Hepatobiliary: No hepatic masses identified. Diffuse hepatic steatosis is demonstrated. Small gallstones and gallbladder sludge are seen. Gallbladder is distended and mild wall thickening is seen although there is no evidence of pericholecystic inflammatory changes or fluid. MRCP  images are degraded by motion artifact. Common bile duct measures 5-6 mm, which is at the upper limits of normal. No evidence of choledocholithiasis or biliary stricture. Pancreas: No mass or inflammatory changes. No evidence of pancreatic ductal dilatation or pancreas divisum. Spleen:  Within normal limits in size and appearance. Adrenals/Urinary Tract: No masses identified. No evidence of hydronephrosis. Stomach/Bowel: Visualized portion unremarkable. Vascular/Lymphatic: No pathologically enlarged lymph nodes identified. No abdominal aortic aneurysm. Other:  None. Musculoskeletal:  No suspicious bone lesions identified. IMPRESSION: Cholelithiasis and gallbladder sludge. Mildly distended gallbladder with mild wall thickening, which may be due to early acute cholecystitis. Recommend clinical correlation, and consider nuclear medicine hepatobiliary scan if warranted. MRCP image degradation by motion artifact, however no significant biliary ductal dilatation or choledocholithiasis visualized. Diffuse hepatic steatosis. Electronically Signed   By: Myles RosenthalJohn  Stahl M.D.   On: 04/11/2019 07:39   [place] Abdomen Limited Ruq  Result Date: 04/10/2019 CLINICAL DATA:  Right upper quadrant pain for 4 days. EXAM: ULTRASOUND ABDOMEN LIMITED RIGHT UPPER QUADRANT COMPARISON:  CT abdomen and pelvis 12/12/2008 FINDINGS: Gallbladder: Moderate volume sludge in the gallbladder with small stones measuring up to 7 mm. Normal gallbladder wall thickness. No sonographic Murphy sign noted by sonographer. Common bile duct: Diameter: 8 mm, mildly dilated without a common duct stone identified though with inadequate visualization of the distal duct. Liver: Prominently increased parenchymal echogenicity diffusely without focal lesion identified. Portal vein is patent on color Doppler imaging with normal direction  of blood flow towards the liver. IMPRESSION: 1. Gallbladder sludge and small stones without evidence of acute cholecystitis. 2. Mild common  bile duct dilatation. No obstructing stone identified although the distal duct was not visualized. 3. Echogenic liver suggesting steatosis. Electronically Signed   By: Sebastian Ache M.D.   On: 04/10/2019 17:33    Anti-infectives: Anti-infectives (From admission, onward)   Start     Dose/Rate Route Frequency Ordered Stop   04/10/19 2315  cefTRIAXone (ROCEPHIN) 2 g in sodium chloride 0.9 % 100 mL IVPB  Status:  Discontinued     2 g 200 mL/hr over 30 Minutes Intravenous Daily at bedtime 04/10/19 2310 04/11/19 1352       Assessment/Plan Acute cholecystitis, cholelithiasis, elevated LFTs S/p laparoscopic cholecystectomy 4/16 Dr. Derrell Lolling - POD #1 - unable to perform IOC; catheter back-walled the cystic duct just past the cystic duct-common bile duct junction, bile drainage stopped with placement Endoloops x 2 at the cystic duct junction - bilirubin up POD#1 3.2 from 2.3  ID - rocephin 4/15>>4/16 FEN - IVF, NPO VTE - SCDs, hold lovenox in case she needs ERCP Foley - none Follow up - DOW  Plan - With elevated Tbili will ask GI to see, may need ERCP. She has only eaten 2 bites of grits early this AM. Keep NPO until seen by GI. Repeat CMP in AM.    LOS: 0 days    Franne Forts , [facility] Surgery 04/12/2019, 10:52 AM Pager: [phone] Mon-Thurs 7:00 am-4:30 pm Fri 7:00 am -11:30 AM Sat-Sun 7:00 am-11:30 am

## 2019-04-13 LAB — COMPREHENSIVE METABOLIC PANEL
ALT: 533 U/L — ABNORMAL HIGH (ref 0–44)
AST: 128 U/L — ABNORMAL HIGH (ref 15–41)
Albumin: 3.8 g/dL (ref 3.5–5.0)
Alkaline Phosphatase: 167 U/L — ABNORMAL HIGH (ref 38–126)
Anion gap: 13 (ref 5–15)
BUN: 6 mg/dL (ref 6–20)
CO2: 24 mmol/L (ref 22–32)
Calcium: 9.4 mg/dL (ref 8.9–10.3)
Chloride: 102 mmol/L (ref 98–111)
Creatinine, Ser: 0.61 mg/dL (ref 0.44–1.00)
GFR calc Af Amer: >60 mL/min (ref >60)
GFR calc non Af Amer: >60 mL/min (ref >60)
Glucose, Bld: 101 mg/dL — ABNORMAL HIGH (ref 70–99)
Potassium: 3.8 mmol/L (ref 3.5–5.1)
Sodium: 139 mmol/L (ref 135–145)
Total Bilirubin: 1.5 mg/dL — ABNORMAL HIGH (ref 0.3–1.2)
Total Protein: 7 g/dL (ref 6.5–8.1)

## 2019-04-13 LAB — CBC
HCT: 40 % (ref 36.0–46.0)
Hemoglobin: 13.3 g/dL (ref 12.0–15.0)
MCH: 28.3 pg (ref 26.0–34.0)
MCHC: 33.3 g/dL (ref 30.0–36.0)
MCV: 85.1 fL (ref 80.0–100.0)
Platelets: 288 10*3/uL (ref 150–400)
RBC: 4.7 MIL/uL (ref 3.87–5.11)
RDW: 12.2 % (ref 11.5–15.5)
WBC: 7.7 10*3/uL (ref 4.0–10.5)
nRBC: 0 % (ref 0.0–0.2)

## 2019-04-13 NOTE — Plan of Care (Signed)

## 2019-04-13 NOTE — Progress Notes (Signed)
Patient ID: Carolyn Mendez, female   DOB: 12/25/1976, 43 y.o.   MRN: [id number] [facility] Progress Note:   2 Days Post-Op  Subjective: Mental status is clear.  Minimal pain Objective: Vital signs in last 24 hours: Temp:  [98.5 F (36.9 C)-99.5 F (37.5 C)] 99.2 F (37.3 C) (04/18 0435) Pulse Rate:  [63-70] 70 (04/18 0435) Resp:  [15-18] 18 (04/18 0435) BP: (113-119)/(65-76) 118/70 (04/18 0435) SpO2:  [96 %-98 %] 96 % (04/18 0435) Weight:  [86.2 kg] 86.2 kg (04/17 1100)  Intake/Output from previous day: 04/17 0701 - 04/18 0700 In: 1941.2 [P.O.:240; I.V.:1701.2] Out: -  Intake/Output this shift: No intake/output data recorded.  Physical Exam: Work of breathing is normal.  Incisions OK  Lab Results:  Results for orders placed or performed during the hospital encounter of 04/10/19 (from the past 48 hour(s))  Glucose, capillary     Status: Abnormal   Collection Time: 04/11/19 11:46 AM  Result Value Ref Range   Glucose-Capillary 164 (H) 70 - 99 mg/dL  Comprehensive metabolic panel     Status: Abnormal   Collection Time: 04/12/19 12:41 AM  Result Value Ref Range   Sodium 135 135 - 145 mmol/L   Potassium 3.4 (L) 3.5 - 5.1 mmol/L   Chloride 102 98 - 111 mmol/L   CO2 28 22 - 32 mmol/L   Glucose, Bld 170 (H) 70 - 99 mg/dL   BUN 6 6 - 20 mg/dL   Creatinine, Ser 4.090.48 0.44 - 1.00 mg/dL   Calcium 8.6 (L) 8.9 - 10.3 mg/dL   Total Protein 6.6 6.5 - 8.1 g/dL   Albumin 3.5 3.5 - 5.0 g/dL   AST 811405 (H) 15 - 41 U/L   ALT 772 (H) 0 - 44 U/L   Alkaline Phosphatase 158 (H) 38 - 126 U/L   Total Bilirubin 3.2 (H) 0.3 - 1.2 mg/dL   GFR calc non Af Amer >60 >60 mL/min   GFR calc Af Amer >60 >60 mL/min   Anion gap 5 5 - 15    Comment: Performed at [facility], [address] [place], [place] [address]  CBC     Status: Abnormal   Collection Time: 04/12/19 12:41 AM  Result Value Ref Range   WBC 7.0 4.0 - 10.5 K/uL   RBC 4.24 3.87 - 5.11 MIL/uL   Hemoglobin 11.9 (L)  12.0 - 15.0 g/dL   HCT 29.535.6 (L) 62.136.0 - 30.846.0 %   MCV 84.0 80.0 - 100.0 fL   MCH 28.1 26.0 - 34.0 pg   MCHC 33.4 30.0 - 36.0 g/dL   RDW 65.712.0 84.611.5 - 96.215.5 %   Platelets 260 150 - 400 K/uL   nRBC 0.0 0.0 - 0.2 %    Comment: Performed at [facility], [address] [place], [place] [address]  CBC     Status: None   Collection Time: 04/13/19  2:41 AM  Result Value Ref Range   WBC 7.7 4.0 - 10.5 K/uL   RBC 4.70 3.87 - 5.11 MIL/uL   Hemoglobin 13.3 12.0 - 15.0 g/dL   HCT 13.240.0 44.036.0 - 10.246.0 %   MCV 85.1 80.0 - 100.0 fL   MCH 28.3 26.0 - 34.0 pg   MCHC 33.3 30.0 - 36.0 g/dL   RDW 72.512.2 36.611.5 - 44.015.5 %   Platelets 288 150 - 400 K/uL   nRBC 0.0 0.0 - 0.2 %    Comment: Performed at [facility], [address] [place], [place] [address]  Comprehensive metabolic panel     Status: Abnormal   Collection Time: 04/13/19  2:41 AM  Result Value Ref Range   Sodium 139 135 - 145 mmol/L   Potassium 3.8 3.5 - 5.1 mmol/L   Chloride 102 98 - 111 mmol/L   CO2 24 22 - 32 mmol/L   Glucose, Bld 101 (H) 70 - 99 mg/dL   BUN 6 6 - 20 mg/dL   Creatinine, Ser 7.85 0.44 - 1.00 mg/dL   Calcium 9.4 8.9 - 88.5 mg/dL   Total Protein 7.0 6.5 - 8.1 g/dL   Albumin 3.8 3.5 - 5.0 g/dL   AST 027 (H) 15 - 41 U/L   ALT 533 (H) 0 - 44 U/L   Alkaline Phosphatase 167 (H) 38 - 126 U/L   Total Bilirubin 1.5 (H) 0.3 - 1.2 mg/dL   GFR calc non Af Amer >60 >60 mL/min   GFR calc Af Amer >60 >60 mL/min   Anion gap 13 5 - 15    Comment: Performed at [facility], [address], [place], [place] [address]    Radiology/Results: Dg Cholangiogram Operative  Result Date: 04/11/2019 CLINICAL DATA:  Intraoperative cholangiogram EXAM: INTRAOPERATIVE CHOLANGIOGRAM TECHNIQUE: Cholangiographic images from the C-arm fluoroscopic device were submitted for interpretation post-operatively. Please see the procedural report for the amount of contrast and the fluoroscopy time utilized. COMPARISON:  None. FINDINGS: The contrast  injection results in extravasated contrast. There is no contrast within the biliary tree or duodenum. IMPRESSION: See above. Electronically Signed   By: Jolaine Click M.D.   On: 04/11/2019 11:26    Anti-infectives: Anti-infectives (From admission, onward)   Start     Dose/Rate Route Frequency Ordered Stop   04/10/19 2315  cefTRIAXone (ROCEPHIN) 2 g in sodium chloride 0.9 % 100 mL IVPB  Status:  Discontinued     2 g 200 mL/hr over 30 Minutes Intravenous Daily at bedtime 04/10/19 2310 04/11/19 1352      Assessment/Plan: Problem List: Patient Active Problem List   Diagnosis Date Noted  . Symptomatic cholelithiasis 04/10/2019  . Status post cesarean delivery 03/16/2018  . Umbilical vein abnormality affecting pregnancy 03/15/2018  . 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 Language barrier 02/16/2018  . Unwanted fertility 11/27/2017  . History of VBAC 10/30/2017  . AMA (advanced maternal age) multigravida 35+ 10/30/2017  . Irritable bowel disease 02/25/2016  . Depression 09/24/2013  . OBESITY 01/15/2009    Bili down to 1.5 with trending down LFTs.  Will watch her today and recheck CMET in AM 2 Days Post-Op    LOS: 1 day   Matt B. Daphine Deutscher, MD, [facility] [phone] beeper [phone]  04/13/2019 8:20 AM

## 2019-04-13 NOTE — Progress Notes (Signed)
Progress note for [facility]  Subjective:   Objective: Vital signs in last 24 hours: Temp:  [98.5 F (36.9 C)-99.5 F (37.5 C)] 99.2 F (37.3 C) (04/18 0839) Pulse Rate:  [63-88] 88 (04/18 0839) Resp:  [16-18] 16 (04/18 0839) BP: (113-125)/(65-80) 125/80 (04/18 0839) SpO2:  [96 %-98 %] 96 % (04/18 0839) Last BM Date: 04/10/19  Intake/Output from previous day: 04/17 0701 - 04/18 0700 In: 1941.2 [P.O.:240; I.V.:1701.2] Out: -  Intake/Output this shift: No intake/output data recorded.  General appearance: alert and no distress GI: tender at the incision sites.  Lab Results: Recent Labs    04/11/19 0330 04/12/19 0041 04/13/19 0241  WBC 3.9* 7.0 7.7  HGB 12.0 11.9* 13.3  HCT 37.7 35.6* 40.0  PLT 282 260 288   BMET Recent Labs    04/11/19 0330 04/12/19 0041 04/13/19 0241  NA 138 135 139  K 3.8 3.4* 3.8  CL 108 102 102  CO2 20* 28 24  GLUCOSE 139* 170* 101*  BUN 12 6 6   CREATININE 0.46 0.48 0.61  CALCIUM 8.8* 8.6* 9.4   LFT Recent Labs    04/13/19 0241  PROT 7.0  ALBUMIN 3.8  AST 128*  ALT 533*  ALKPHOS 167*  BILITOT 1.5*   PT/INR No results for input(s): LABPROT, INR in the last 72 hours. Hepatitis Panel No results for input(s): HEPBSAG, HCVAB, HEPAIGM, HEPBIGM in the last 72 hours. C-Diff No results for input(s): CDIFFTOX in the last 72 hours. Fecal Lactopherrin No results for input(s): FECLLACTOFRN in the last 72 hours.  Studies/Results: No results found.  Medications:  Scheduled: . acetaminophen  650 mg Oral Q6H   Continuous: . dextrose 5 % and 0.9% NaCl 20 mL/hr at 04/13/19 0500    Assessment/Plan: 1) S/p lap chole for cholelithiasis. 2) Abnormal liver enzymes.   The patient is well.  Her TB has declined and her liver enzymes are declining.  Most likely the elevation was as a result of the surgical manipulation and this is a transient situation.  She does not have any worsening abdominal pain.  Plan: 1) Routine post op care. 2)  Signing off, but call if there are any further questions or issues.  LOS: 1 day   Yumi Insalaco D 04/13/2019, 11:58 AM

## 2019-04-13 NOTE — Progress Notes (Signed)
Pt advanced to Full liquid diet per Dr. Daphine Deutscher. Pt states she had some abdominal pain and she felt bloated and had some gas.

## 2019-04-13 NOTE — Plan of Care (Signed)
  Problem: Coping: Goal: Level of anxiety will decrease Outcome: Progressing   Problem: Pain Managment: Goal: General experience of comfort will improve Outcome: Progressing   Problem: Safety: Goal: Ability to remain free from injury will improve Outcome: Progressing   Problem: Skin Integrity: Goal: Risk for impaired skin integrity will decrease Outcome: Progressing   

## 2019-04-14 ENCOUNTER — Inpatient Hospital Stay (HOSPITAL_COMMUNITY): Payer: Medicaid Other

## 2019-04-14 LAB — COMPREHENSIVE METABOLIC PANEL
ALT: 423 U/L — ABNORMAL HIGH (ref 0–44)
AST: 208 U/L — ABNORMAL HIGH (ref 15–41)
Albumin: 3.5 g/dL (ref 3.5–5.0)
Alkaline Phosphatase: 203 U/L — ABNORMAL HIGH (ref 38–126)
Anion gap: 10 (ref 5–15)
BUN: 8 mg/dL (ref 6–20)
CO2: 25 mmol/L (ref 22–32)
Calcium: 9.2 mg/dL (ref 8.9–10.3)
Chloride: 103 mmol/L (ref 98–111)
Creatinine, Ser: 0.61 mg/dL (ref 0.44–1.00)
GFR calc Af Amer: >60 mL/min (ref >60)
GFR calc non Af Amer: >60 mL/min (ref >60)
Glucose, Bld: 115 mg/dL — ABNORMAL HIGH (ref 70–99)
Potassium: 3.5 mmol/L (ref 3.5–5.1)
Sodium: 138 mmol/L (ref 135–145)
Total Bilirubin: 1.7 mg/dL — ABNORMAL HIGH (ref 0.3–1.2)
Total Protein: 6.6 g/dL (ref 6.5–8.1)

## 2019-04-14 LAB — GLUCOSE, CAPILLARY: Glucose-Capillary: 134 mg/dL — ABNORMAL HIGH (ref 70–99)

## 2019-04-14 IMAGING — NM NM HEPATOBILIARY SCAN
2 series · 12 of 12 positions shown · non-contrast
Comparison: None.

CLINICAL DATA: Laparoscopic cholecystectomy [DATE]. Persistent
elevated liver enzymes with pain.

EXAM:
NUCLEAR MEDICINE HEPATOBILIARY IMAGING
TECHNIQUE: Sequential images of the abdomen were obtained [DATE] minutes
following intravenous administration of radiopharmaceutical.
RADIOPHARMACEUTICALS:  5.3 mCi [6R]  Choletec IV

[he hepatobiliary · 4.52mm/px · 6 of 60 frames shown (1 of 2)]
[frame 6/60]
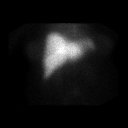
[frame 16/60]
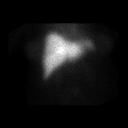
[frame 26/60]
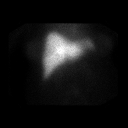
[frame 36/60]
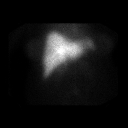
[frame 46/60]
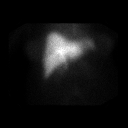
[frame 56/60]
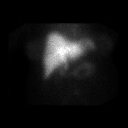

[he hepatobiliary · 4.52mm/px · 6 of 60 frames shown (2 of 2)]
[frame 6/60]
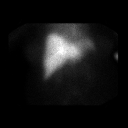
[frame 16/60]
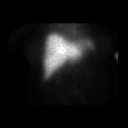
[frame 26/60]
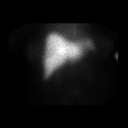
[frame 36/60]
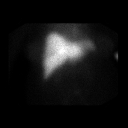
[frame 46/60]
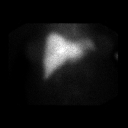
[frame 56/60]
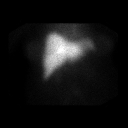

[12 of 12 positions shown; findings below may reference images not displayed]

FINDINGS: Relatively brisk uptake of radiotracer noted diffusely in the liver
parenchyma with perhaps slightly decreased clearance of blood pool
activity. Delayed appearance of biliary activity [DATE] approximately
1 hour. Common bile duct activity is noted at 95 minutes.

No evidence for radiotracer accumulation in the gallbladder fossa,
over the surface of the liver, or in the peritoneal cavity/paracolic
gutter.
IMPRESSION: 1. Relatively delayed clearance of blood pool activity and excretion
of radiotracer although common bile duct and gut activity visible by
110 minutes. Underlying component of hepatocyte dysfunction not
excluded.
2. No evidence for extra biliary accumulation of radiotracer to
suggest bile leak.

## 2019-04-14 MED ORDER — TRAMADOL HCL 50 MG PO TABS: 50.0000 mg | ORAL_TABLET | Freq: Four times a day (QID) | ORAL | 0 refills | Status: DC | PRN

## 2019-04-14 MED ORDER — TECHNETIUM TC 99M MEBROFENIN IV KIT
5.0400 | PACK | Freq: Once | INTRAVENOUS | Status: AC | PRN
  Administered 2019-04-14: 13:00:00 5.04 via INTRAVENOUS

## 2019-04-14 NOTE — Plan of Care (Signed)

## 2019-04-14 NOTE — Progress Notes (Signed)
D/w Dr. Elnoria Howard.  D/t con't abnormal LFTS will obtain HIDA to r/o leak vs. Retained stone. Held DC and s/w RN.

## 2019-04-14 NOTE — Plan of Care (Signed)
  Problem: Activity: Goal: Risk for activity intolerance will decrease Outcome: Progressing   Problem: Coping: Goal: Level of anxiety will decrease Outcome: Progressing   Problem: Pain Managment: Goal: General experience of comfort will improve Outcome: Progressing   Problem: Safety: Goal: Ability to remain free from injury will improve Outcome: Progressing   Problem: Skin Integrity: Goal: Risk for impaired skin integrity will decrease Outcome: Progressing   

## 2019-04-14 NOTE — Progress Notes (Addendum)
Subjective: Mild recurrent RUQ pain with radiation to her back.  Objective: Vital signs in last 24 hours: Temp:  [98.1 F (36.7 C)-99 F (37.2 C)] 99 F (37.2 C) (04/19 1017) Pulse Rate:  [56-88] 80 (04/19 1017) Resp:  [15-18] 18 (04/19 1017) BP: (97-121)/(63-69) 108/67 (04/19 1017) SpO2:  [95 %-98 %] 95 % (04/19 1017) Last BM Date: 04/10/19  Intake/Output from previous day: 04/18 0701 - 04/19 0700 In: 720 [P.O.:720] Out: -  Intake/Output this shift: Total I/O In: 240 [P.O.:240] Out: -   General appearance: alert and no distress GI: tender at the incision sites  Lab Results: Recent Labs    04/12/19 0041 04/13/19 0241  WBC 7.0 7.7  HGB 11.9* 13.3  HCT 35.6* 40.0  PLT 260 288   BMET Recent Labs    04/12/19 0041 04/13/19 0241 04/14/19 0309  NA 135 139 138  K 3.4* 3.8 3.5  CL 102 102 103  CO2 28 24 25   GLUCOSE 170* 101* 115*  BUN 6 6 8   CREATININE 0.48 0.61 0.61  CALCIUM 8.6* 9.4 9.2   LFT Recent Labs    04/14/19 0309  PROT 6.6  ALBUMIN 3.5  AST 208*  ALT 423*  ALKPHOS 203*  BILITOT 1.7*   PT/INR No results for input(s): LABPROT, INR in the last 72 hours. Hepatitis Panel No results for input(s): HEPBSAG, HCVAB, HEPAIGM, HEPBIGM in the last 72 hours. C-Diff No results for input(s): CDIFFTOX in the last 72 hours. Fecal Lactopherrin No results for input(s): FECLLACTOFRN in the last 72 hours.  Studies/Results: No results found.  Medications:  Scheduled: . acetaminophen  650 mg Oral Q6H   Continuous: . dextrose 5 % and 0.9% NaCl Stopped (04/14/19 0344)    Assessment/Plan: 1) Elevated liver enzymes. 2) Recurrent RUQ biliary pain.   The patient reports that she is experiencing her same biliary pain, but it is mild.  There is an increase in her liver enzymes.  This is suspicious for choledocholithiasis.  The MRCP was reviewed with radiology this AM and it was felt that the MRCP portion of the MRI was of good quality and there was no evidence  of any stones.  The case was discussed with Dr. Derrell Lolling and the plan is to pursue a HIDA to see if there is any evidence of a bile leak versus stones in the duct.  Plan: 1) Await HIDA scan. 2) If the HIDA is not diagnostic, and the patient continues to be symptomatic with elevated liver enzymes, an EUS can be performed versus a repeat MRCP. 3) Dr. Leone Payor will assume care in the AM.   ADDENDUM:  The patient will be reevaluated in the AM, but she will be made NPO in case an ERCP is required.  LOS: 2 days   Carolyn Mendez D 04/14/2019, 11:58 AM

## 2019-04-14 NOTE — Discharge Summary (Signed)
Physician Discharge Summary  Patient ID: Carolyn Mendez MRN: [id number] DOB/AGE: December 31, 1975 43 y.o.  PCP: Latrelle Dodrill, MD  Admit date: 04/10/2019 Discharge date: 04/14/2019  Admission Diagnoses:  cholecystitis  Discharge Diagnoses:  Same with possible CBD stone  Active Problems:   Symptomatic cholelithiasis   Surgery:  Lap cholecystectomy by Dr. Derrell Lolling  Discharged Condition: improved  Hospital Course:   Had surgery;  Bump in LFTs ;  GI consult.  LFTs got better and GI signed off.  Patient better bur LFTs still elevated and Bilirubin 1.7.  She taking full liquid diet and wants to go home.  Will discharge  Consults:  GI  Significant Diagnostic Studies: CMET    Discharge Exam: Blood pressure 98/64, pulse (!) 56, temperature 98.2 F (36.8 C), temperature source Oral, resp. rate 16, height 5\' 3"  (1.6 m), weight 86.2 kg, SpO2 95 %, currently breastfeeding. Incisions ok  Disposition:    Allergies as of 04/14/2019   Not on File     Medication List    TAKE these medications   predniSONE 20 MG tablet Commonly known as:  DELTASONE Take 1 tablet (20 mg total) by mouth daily with breakfast. Two daily with food   PrePLUS 27-1 MG Tabs Take 1 tablet daily by mouth.   traMADol 50 MG tablet Commonly known as:  ULTRAM Take 1 tablet (50 mg total) by mouth every 6 (six) hours as needed (mild pain).      Follow-up Information    [facility] Follow up on 04/25/2019.   Specialty:  General Surgery Why:  a provider will call you on 04/30 at 2:45pm. Please take a photo of your incisions and send them to [email or web address] a few days before your appt. Please put date of birth and name in subject line.  Contact information: [address] [place] [address] [phone]          Signed: Valarie Merino 04/14/2019, 8:44 AM

## 2019-04-15 ENCOUNTER — Inpatient Hospital Stay (HOSPITAL_COMMUNITY): Payer: Medicaid Other

## 2019-04-15 DIAGNOSIS — R1011 Right upper quadrant pain: Secondary | ICD-10-CM

## 2019-04-15 DIAGNOSIS — R945 Abnormal results of liver function studies: Secondary | ICD-10-CM

## 2019-04-15 DIAGNOSIS — K760 Fatty (change of) liver, not elsewhere classified: Secondary | ICD-10-CM

## 2019-04-15 LAB — COMPREHENSIVE METABOLIC PANEL
ALT: 481 U/L — ABNORMAL HIGH (ref 0–44)
AST: 207 U/L — ABNORMAL HIGH (ref 15–41)
Albumin: 3.7 g/dL (ref 3.5–5.0)
Alkaline Phosphatase: 227 U/L — ABNORMAL HIGH (ref 38–126)
Anion gap: 11 (ref 5–15)
BUN: 10 mg/dL (ref 6–20)
CO2: 24 mmol/L (ref 22–32)
Calcium: 9.4 mg/dL (ref 8.9–10.3)
Chloride: 104 mmol/L (ref 98–111)
Creatinine, Ser: 0.55 mg/dL (ref 0.44–1.00)
GFR calc Af Amer: >60 mL/min (ref >60)
GFR calc non Af Amer: >60 mL/min (ref >60)
Glucose, Bld: 121 mg/dL — ABNORMAL HIGH (ref 70–99)
Potassium: 3.6 mmol/L (ref 3.5–5.1)
Sodium: 139 mmol/L (ref 135–145)
Total Bilirubin: 1.9 mg/dL — ABNORMAL HIGH (ref 0.3–1.2)
Total Protein: 6.8 g/dL (ref 6.5–8.1)

## 2019-04-15 IMAGING — MR MR ABDOMEN W/ CM MRCP
18 of 21 series · 45 of 48 positions shown · IV contrast (Contrast agent)
Comparison: MRCP [DATE].  Cholangiogram [DATE].

CLINICAL DATA: Abnormal liver function tests. Status post
cholangiogram on [DATE]. Cholecystectomy [DATE].

EXAM:
MRI ABDOMEN WITHOUT AND WITH CONTRAST (INCLUDING MRCP)
TECHNIQUE: Multiplanar multisequence MR imaging of the abdomen was performed
both before and after the administration of intravenous contrast.
Heavily T2-weighted images of the biliary and pancreatic ducts were
obtained, and three-dimensional MRCP images were rendered by post
processing.
CONTRAST:  8 cc Gadavist

[Series 4: ax haste · axial · 6.0mm · 1.44mm/px · 1 of 40 slices shown]
[im 1/40]
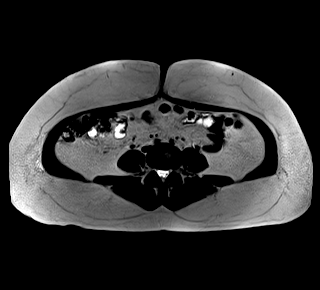

[Series 5: bSSFP · coronal · 6.0mm · 0.88mm/px · 1 of 30 slices shown]
[im 1/30]
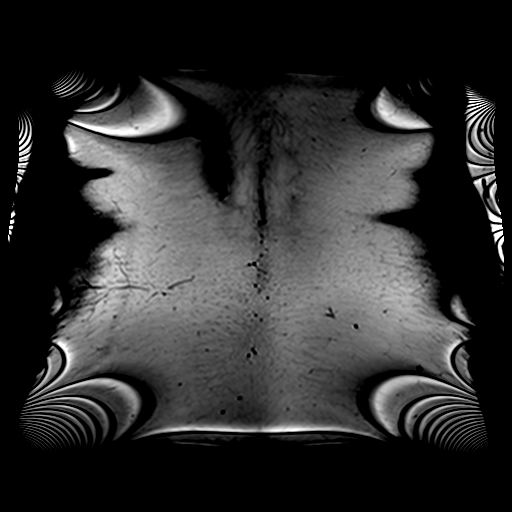

[Series 6: T2 fat-sat · axial · 6.0mm · 1.44mm/px · 1 of 40 slices shown]
[im 1/40]
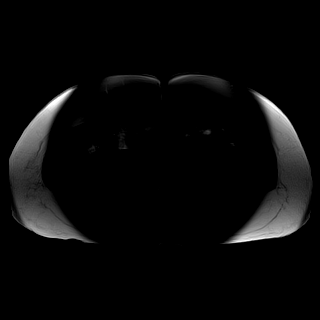

[Series 11: DWI · axial · 6.0mm · 1.72mm/px · z∈[-109,+172]mm · 4 of 120 slices shown (1 of 2)]
[im 1/120]
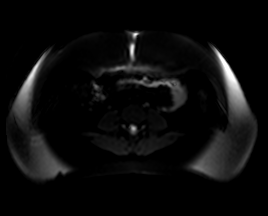
[im 40/120]
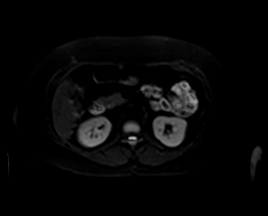
[im 80/120]
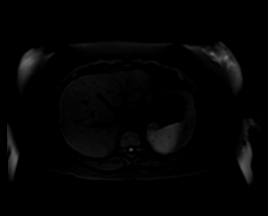
[im 120/120]
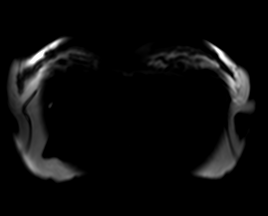

[Series 12: DWI · axial · 6.0mm · 1.72mm/px · 1 of 40 slices shown (2 of 2)]
[im 1/40]
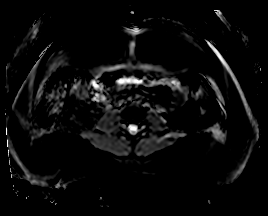

[Series 13: ax in and · axial · 3.0mm · 1.44mm/px · z∈[-111,+174]mm · 6 of 192 slices shown]
[im 1/192]
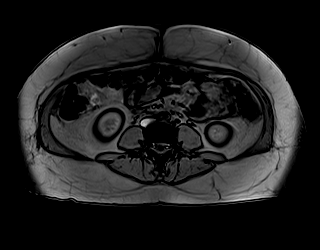
[im 39/192]
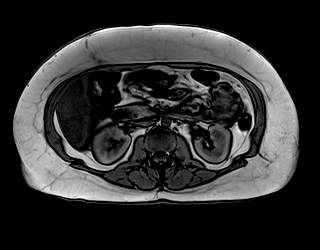
[im 77/192]
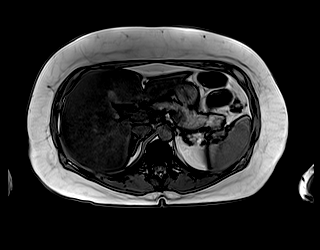
[im 115/192]
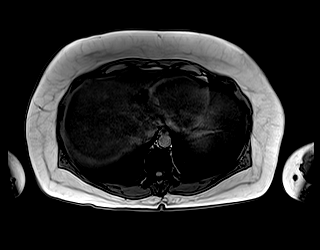
[im 153/192]
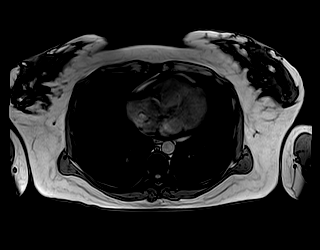
[im 192/192]
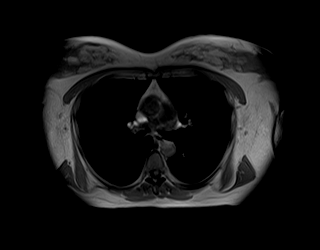

[Series 14: MRCP · coronal · 4.0mm · 1.19mm/px · 1 of 17 slices shown]
[im 1/17]
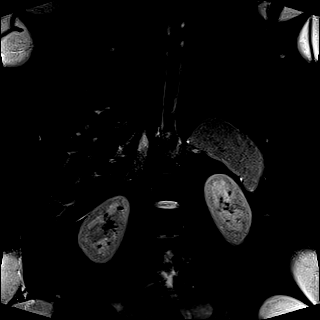

[Series 15: radials · coronal · 50.0mm · 0.78mm/px · 1 of 5 slices shown]
[im 1/5]
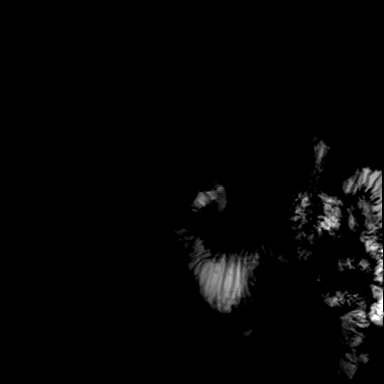

[Series 16: T1 dynamic · axial · non-contrast · 3.0mm · 1.44mm/px · z∈[-103,+182]mm · 3 of 96 slices shown (1 of 5)]
[im 1/96]
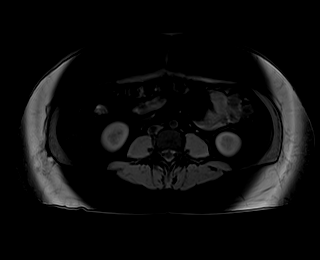
[im 48/96]
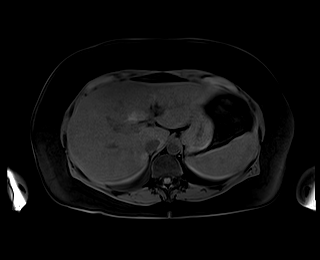
[im 96/96]
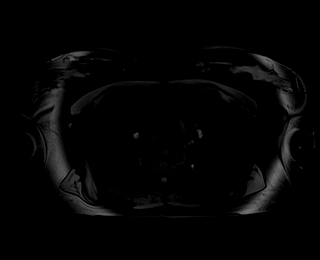

[Series 18: T1 dynamic post-contrast · axial · 3.0mm · 1.44mm/px · z∈[-103,+182]mm · 3 of 96 slices shown (1 of 5)]
[im 1/96]
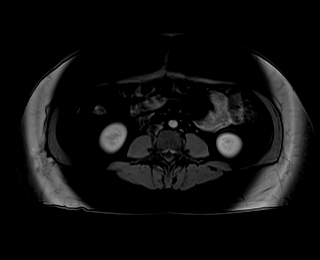
[im 48/96]
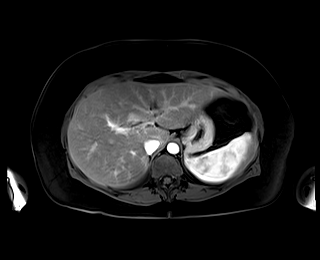
[im 96/96]
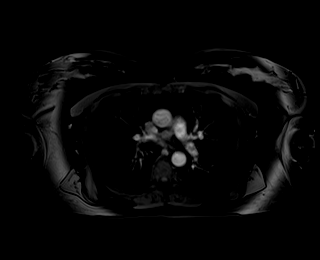

[Series 19: T1 dynamic · axial · 3.0mm · 1.44mm/px · z∈[-103,+182]mm · 3 of 96 slices shown (2 of 5)]
[im 1/96]
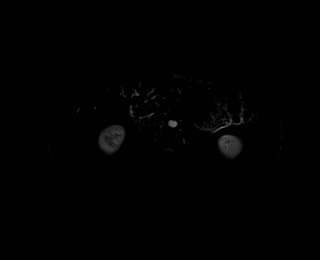
[im 48/96]
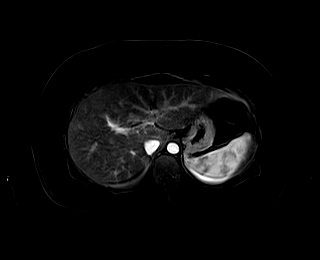
[im 96/96]
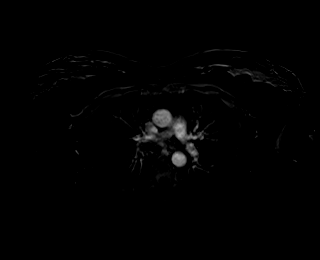

[Series 20: T1 dynamic post-contrast · axial · 3.0mm · 1.44mm/px · z∈[-103,+182]mm · 3 of 96 slices shown (2 of 5)]
[im 1/96]
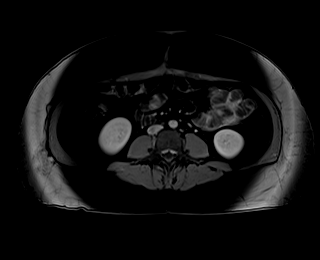
[im 48/96]
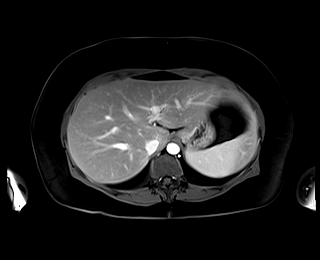
[im 96/96]
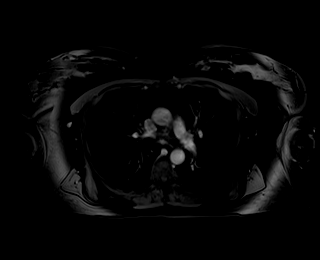

[Series 21: T1 dynamic · axial · 3.0mm · 1.44mm/px · z∈[-103,+182]mm · 3 of 96 slices shown (3 of 5)]
[im 1/96]
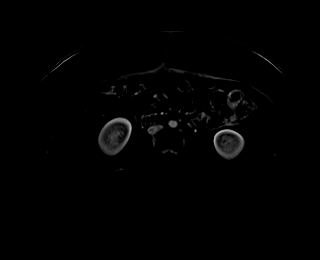
[im 48/96]
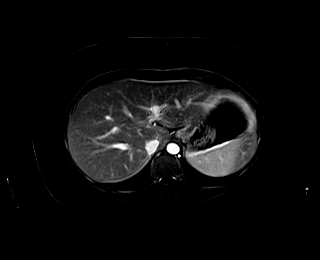
[im 96/96]
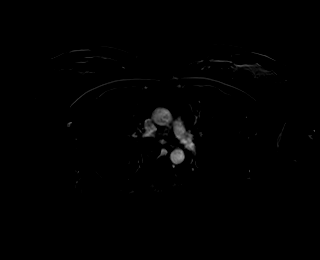

[Series 22: T1 dynamic post-contrast · axial · 3.0mm · 1.44mm/px · z∈[-103,+182]mm · 3 of 96 slices shown (3 of 5)]
[im 1/96]
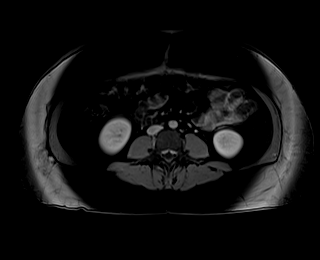
[im 48/96]
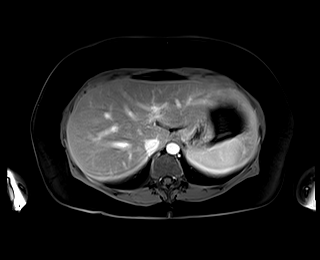
[im 96/96]
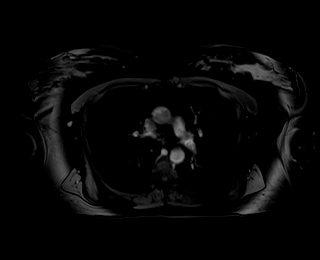

[Series 23: T1 dynamic · axial · 3.0mm · 1.44mm/px · z∈[-103,+182]mm · 3 of 96 slices shown (4 of 5)]
[im 1/96]
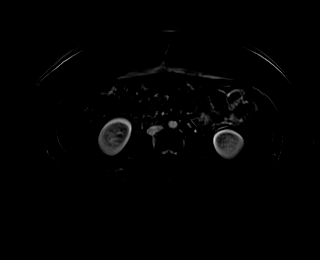
[im 48/96]
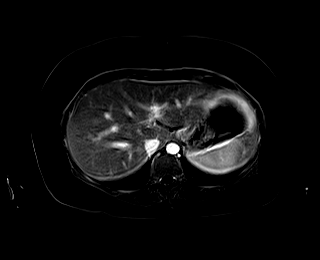
[im 96/96]
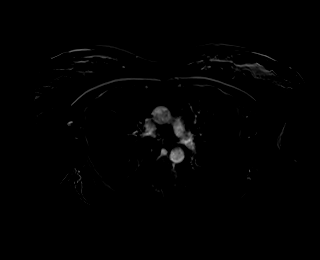

[Series 24: T1 dynamic post-contrast · axial · 3.0mm · 1.44mm/px · z∈[-103,+182]mm · 3 of 96 slices shown (4 of 5)]
[im 1/96]
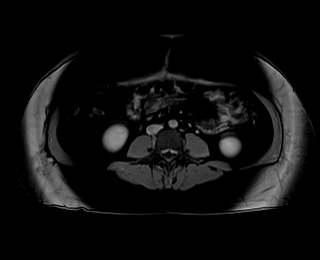
[im 48/96]
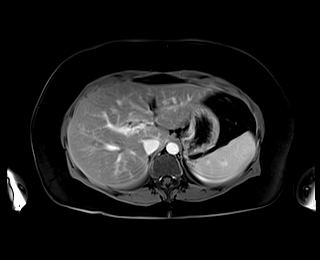
[im 96/96]
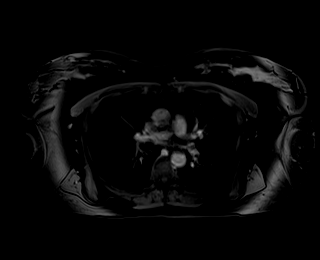

[Series 25: T1 dynamic · axial · 3.0mm · 1.44mm/px · z∈[-103,+182]mm · 3 of 96 slices shown (5 of 5)]
[im 1/96]
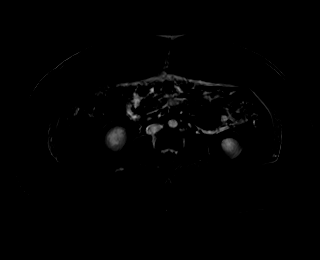
[im 48/96]
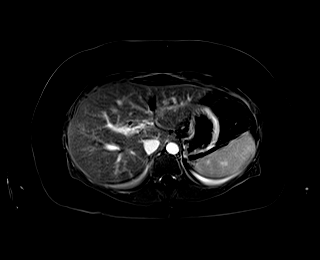
[im 96/96]
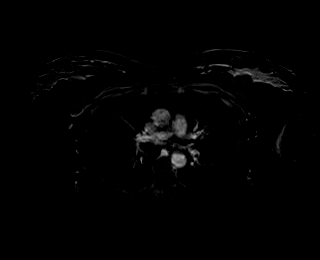

[Series 26: T1 dynamic post-contrast · coronal · 3.0mm · 1.50mm/px · 2 of 72 slices shown (5 of 5)]
[im 1/72]
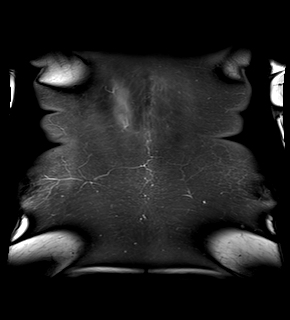
[im 72/72]
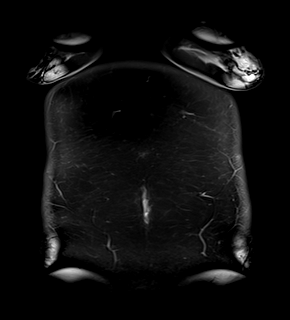

[45 of 48 positions shown; findings below may reference images not displayed]

FINDINGS: Lower chest: Normal heart size without pericardial or pleural
effusion.

Hepatobiliary: Hepatomegaly at 19.7 cm craniocaudal. Marked hepatic
steatosis.

Cholecystectomy. No fluid collection in the operative bed. There is
mild postoperative edema within the pericholecystic region of the
liver on image [DATE] and image 102/11.

No biliary duct dilatation or choledocholithiasis.

Pancreas:  Normal, without mass or ductal dilatation.

Spleen:  Normal in size, without focal abnormality.

Adrenals/Urinary Tract: Normal adrenal glands. Normal kidneys,
without hydronephrosis.

Stomach/Bowel: Normal stomach and abdominal bowel loops.

Vascular/Lymphatic: Normal caliber of the aorta and branch vessels.
No retroperitoneal or retrocrural adenopathy.

Other:  No ascites.

Musculoskeletal: No acute osseous abnormality.
IMPRESSION: 1. Status post cholecystectomy, without choledocholithiasis or other
acute complication.
2. Hepatic steatosis and hepatomegaly.

## 2019-04-15 MED ORDER — GADOBUTROL 1 MMOL/ML IV SOLN
8.0000 mL | Freq: Once | INTRAVENOUS | Status: AC | PRN
  Administered 2019-04-15: 8 mL via INTRAVENOUS

## 2019-04-15 NOTE — Progress Notes (Addendum)
Daily Rounding Note  04/15/2019, 8:30 AM  LOS: 3 days   SUBJECTIVE:   Chief complaint:     Tolerating po yesterday, did not exacerbate pain.  Pain in RUQ is improved.  No N/V  OBJECTIVE:         Vital signs in last 24 hours:    Temp:  [98.3 F (36.8 C)-99 F (37.2 C)] 98.3 F (36.8 C) (04/20 0443) Pulse Rate:  [69-80] 69 (04/20 0443) Resp:  [18] 18 (04/20 0443) BP: (97-108)/(56-67) 99/60 (04/20 0443) SpO2:  [94 %-96 %] 94 % (04/20 0443) Last BM Date: 04/10/19 Filed Weights   04/12/19 1100  Weight: 86.2 kg   General: looks well, comfortable.     Heart: RRR Chest: clear bil.   Abdomen: quiet, intact incision sites, ND.  Tenderness mild in mid right abdomen  Extremities: no CCE Neuro/Psych:  No gross deficits, good historian.  Calm.     Lab Results: Recent Labs    04/13/19 0241  WBC 7.7  HGB 13.3  HCT 40.0  PLT 288   BMET Recent Labs    04/13/19 0241 04/14/19 0309 04/15/19 0436  NA 139 138 139  K 3.8 3.5 3.6  CL 102 103 104  CO2 _0 GLUCOSE 101* 115* 121*  BUN _1 CREATININE 0.61 0.61 0.55  CALCIUM 9.4 9.2 9.4   LFT Recent Labs    04/13/19 0241 04/14/19 0309 04/15/19 0436  PROT 7.0 6.6 6.8  ALBUMIN 3.8 3.5 3.7  AST 128* 208* 207*  ALT 533* 423* 481*  ALKPHOS 167* 203* 227*  BILITOT 1.5* 1.7* 1.9*    Nm Hepato Biliary Leak  Result Date: 04/14/2019 CLINICAL DATA:  Laparoscopic cholecystectomy 04/11/2019. Persistent elevated liver enzymes with pain. EXAM: NUCLEAR MEDICINE HEPATOBILIARY IMAGING TECHNIQUE: Sequential images of the abdomen were obtained out to 60 minutes following intravenous administration of radiopharmaceutical. RADIOPHARMACEUTICALS:  5.3 mCi Tc-56m Choletec IV COMPARISON:  None. FINDINGS: Relatively brisk uptake of radiotracer noted diffusely in the liver parenchyma with perhaps slightly decreased clearance of blood pool activity. Delayed appearance of biliary  activity out 2 approximately 1 hour. Common bile duct activity is noted at 95 minutes. No evidence for radiotracer accumulation in the gallbladder fossa, over the surface of the liver, or in the peritoneal cavity/paracolic gutter. IMPRESSION: 1. Relatively delayed clearance of blood pool activity and excretion of radiotracer although common bile duct and gut activity visible by 110 minutes. Underlying component of hepatocyte dysfunction not excluded. 2. No evidence for extra biliary accumulation of radiotracer to suggest bile leak. Electronically Signed   By: EMisty StanleyM.D.   On: 04/14/2019 15:07   Scheduled Meds: . acetaminophen  650 mg Oral Q6H   Continuous Infusions: . dextrose 5 % and 0.9% NaCl Stopped (04/14/19 0344)   PRN Meds:.HYDROmorphone (DILAUDID) injection, methocarbamol, ondansetron (ZOFRAN) IV, oxyCODONE, promethazine, simethicone, traMADol   ASSESMENT:   *    Elevated LFTs.  RUQ pain persisted post cholecystectomy 4/16 with some leaking on IOC, addressed with endoloops applied to cystic duct jx.   Ultrasound with sludge, GB stones, 7 mm CBD, increased liver echo/steatosis. MRCP: fatty liver.  GB stones, sludge, mild GB wall thickening/?early cholecystitis, 5 to 6 mm CBD.  S/p cholecystectomy 4/16 HIDA: No bile leak, delayed excretion and late imaging of CBD and gut activity, can not r/o  Hepatic dyscfunction.    LFTs overall improved with exception of alk phos.  Pain  overall improved.      PLAN   *  Based on imaging including HIDA, not clear pt needs ERCP.      Carolyn Mendez  04/15/2019, 8:30 AM Phone [place] Ybanez Attending   I have taken an interval history, reviewed the chart and examined the patient. I agree with the Advanced Practitioner's note, impression and recommendations.    She is non-tender but still having some pain in RUQ - ? Post-op vs retained stone I have seen severe fatty liver cause elevations in LFT's like she has  Discussed  w/ Claiborne Billings Rayburn PA-C - we will repeat an MRCP and be prepared for an ERCP if needed tomorrow (MRCP shows CBD stones or other problem needing ERCP OK to have liquids at least  Gatha Mayer, MD, [facility] 04/15/2019 10:46 AM Pager [phone]

## 2019-04-15 NOTE — Progress Notes (Signed)
[facility] Progress Note  4 Days Post-Op  Subjective: CC: abdominal pain Patient still complaining of RUQ pain that radiates to back. Also reports swelling sensation to RUQ. Denies nausea or vomiting. Tolerated PO intake yesterday and did not report worsened pain with PO intake. Had a BM yesterday.  HIDA yesterday negative for leak.   Objective: Vital signs in last 24 hours: Temp:  [98.3 F (36.8 C)-99 F (37.2 C)] 98.3 F (36.8 C) (04/20 0443) Pulse Rate:  [69-80] 69 (04/20 0443) Resp:  [18] 18 (04/20 0443) BP: (97-108)/(56-67) 99/60 (04/20 0443) SpO2:  [94 %-96 %] 94 % (04/20 0443) Last BM Date: 04/10/19  Intake/Output from previous day: 04/19 0701 - 04/20 0700 In: 480 [P.O.:480] Out: -  Intake/Output this shift: No intake/output data recorded.  PE: Gen:  Alert, NAD, pleasant Card:  Regular rate and rhythm, pedal pulses 2+ BL Pulm:  Normal effort, clear to auscultation bilaterally Abd: Soft, minimally TTP in RUQ, non-distended, +BS, incisions C/D/I Skin: warm and dry, no rashes  Psych: A&Ox3   Lab Results:  Recent Labs    04/13/19 0241  WBC 7.7  HGB 13.3  HCT 40.0  PLT 288   BMET Recent Labs    04/14/19 0309 04/15/19 0436  NA 138 139  K 3.5 3.6  CL 103 104  CO2 25 24  GLUCOSE 115* 121*  BUN 8 10  CREATININE 0.61 0.55  CALCIUM 9.2 9.4   PT/INR No results for input(s): LABPROT, INR in the last 72 hours. CMP     Component Value Date/Time   NA 139 04/15/2019 0436   NA 138 11/13/2017 0911   K 3.6 04/15/2019 0436   CL 104 04/15/2019 0436   CO2 24 04/15/2019 0436   GLUCOSE 121 (H) 04/15/2019 0436   BUN 10 04/15/2019 0436   BUN 8 11/13/2017 0911   CREATININE 0.55 04/15/2019 0436   CREATININE 0.58 02/29/2016 0840   CALCIUM 9.4 04/15/2019 0436   PROT 6.8 04/15/2019 0436   PROT 6.3 11/13/2017 0911   ALBUMIN 3.7 04/15/2019 0436   ALBUMIN 3.6 11/13/2017 0911   AST 207 (H) 04/15/2019 0436   ALT 481 (H) 04/15/2019 0436   ALKPHOS 227  (H) 04/15/2019 0436   BILITOT 1.9 (H) 04/15/2019 0436   BILITOT 0.4 11/13/2017 0911   GFRNONAA >60 04/15/2019 0436   GFRAA >60 04/15/2019 0436   Lipase     Component Value Date/Time   LIPASE 39 04/10/2019 1543       Studies/Results: Nm Hepato Biliary Leak  Result Date: 04/14/2019 CLINICAL DATA:  Laparoscopic cholecystectomy 04/11/2019. Persistent elevated liver enzymes with pain. EXAM: NUCLEAR MEDICINE HEPATOBILIARY IMAGING TECHNIQUE: Sequential images of the abdomen were obtained out to 60 minutes following intravenous administration of radiopharmaceutical. RADIOPHARMACEUTICALS:  5.3 mCi Tc-7129m  Choletec IV COMPARISON:  None. FINDINGS: Relatively brisk uptake of radiotracer noted diffusely in the liver parenchyma with perhaps slightly decreased clearance of blood pool activity. Delayed appearance of biliary activity out 2 approximately 1 hour. Common bile duct activity is noted at 95 minutes. No evidence for radiotracer accumulation in the gallbladder fossa, over the surface of the liver, or in the peritoneal cavity/paracolic gutter. IMPRESSION: 1. Relatively delayed clearance of blood pool activity and excretion of radiotracer although common bile duct and gut activity visible by 110 minutes. Underlying component of hepatocyte dysfunction not excluded. 2. No evidence for extra biliary accumulation of radiotracer to suggest bile leak. Electronically Signed   By: Kennith CenterEric  Mansell M.D.   On:  04/14/2019 15:07    Anti-infectives: Anti-infectives (From admission, onward)   Start     Dose/Rate Route Frequency Ordered Stop   04/10/19 2315  cefTRIAXone (ROCEPHIN) 2 g in sodium chloride 0.9 % 100 mL IVPB  Status:  Discontinued     2 g 200 mL/hr over 30 Minutes Intravenous Daily at bedtime 04/10/19 2310 04/11/19 1352       Assessment/Plan T2DM GERD  Acute calculous cholecystitis - s/p lap chole with IOC 4/16 Dr. Derrell Lolling - POD#4 - Tbili remains mildly elevated at 1.9, LFTs elevated as well -  MRCP pre-op showed fatty liver  - HIDA yesterday negative - may need repeat MRCP   FEN: NPO, IVF - will put on diet if no ERCP planned VTE: SCDs ID: rocephin 4/15>4/16  Will discuss possible repeat MRCP with MD. Appreciate GI assistance.   LOS: 3 days    Wells Guiles , [facility] 04/15/2019, 9:19 AM Pager: [phone] Consults: [phone]

## 2019-04-15 NOTE — Progress Notes (Signed)
The patient remains NPO for possible ERCP today

## 2019-04-15 NOTE — Progress Notes (Signed)
Patient to MRI.

## 2019-04-16 ENCOUNTER — Other Ambulatory Visit: Payer: Self-pay | Admitting: *Deleted

## 2019-04-16 DIAGNOSIS — K760 Fatty (change of) liver, not elsewhere classified: Secondary | ICD-10-CM | POA: Diagnosis not present

## 2019-04-16 LAB — COMPREHENSIVE METABOLIC PANEL
ALT: 369 U/L — ABNORMAL HIGH (ref 0–44)
AST: 155 U/L — ABNORMAL HIGH (ref 15–41)
Albumin: 3.8 g/dL (ref 3.5–5.0)
Alkaline Phosphatase: 264 U/L — ABNORMAL HIGH (ref 38–126)
Anion gap: 10 (ref 5–15)
BUN: 11 mg/dL (ref 6–20)
CO2: 24 mmol/L (ref 22–32)
Calcium: 9.5 mg/dL (ref 8.9–10.3)
Chloride: 103 mmol/L (ref 98–111)
Creatinine, Ser: 0.59 mg/dL (ref 0.44–1.00)
GFR calc Af Amer: >60 mL/min (ref >60)
GFR calc non Af Amer: >60 mL/min (ref >60)
Glucose, Bld: 117 mg/dL — ABNORMAL HIGH (ref 70–99)
Potassium: 3.7 mmol/L (ref 3.5–5.1)
Sodium: 137 mmol/L (ref 135–145)
Total Bilirubin: 1.9 mg/dL — ABNORMAL HIGH (ref 0.3–1.2)
Total Protein: 7.2 g/dL (ref 6.5–8.1)

## 2019-04-16 MED ORDER — IBUPROFEN 600 MG PO TABS: 600.0000 mg | ORAL_TABLET | Freq: Three times a day (TID) | ORAL | Status: DC | PRN

## 2019-04-16 NOTE — Progress Notes (Addendum)
Daily Rounding Note  04/16/2019, 9:23 AM  LOS: 4 days   SUBJECTIVE:   Chief complaint: elevated LFTs.  RUQ pain post chole   Pain better.  Tolerating solid diet  OBJECTIVE:         Vital signs in last 24 hours:    Temp:  [98.3 F (36.8 C)-98.8 F (37.1 C)] 98.3 F (36.8 C) (04/21 0421) Pulse Rate:  [64-79] 78 (04/21 0421) Resp:  [16-20] 16 (04/21 0421) BP: (95-103)/(44-64) 99/62 (04/21 0421) SpO2:  [96 %-97 %] 97 % (04/21 0421) Last BM Date: 04/10/19 Filed Weights   04/12/19 1100  Weight: 86.2 kg   General: moderately obese.  NAD.   Not ill looking   Heart: RRR Chest: clear bil.   Abdomen: large, soft, ND.  Minor abd tenderness RUQ.  Incisions benign Extremities: no CCE Neuro/Psych: fully alert and oriented.    Lab Results: No results for input(s): WBC, HGB, HCT, PLT in the last 72 hours. BMET Recent Labs    04/14/19 0309 04/15/19 0436 04/16/19 0255  NA 138 139 137  K 3.5 3.6 3.7  CL 103 104 103  CO2 _0 GLUCOSE 115* 121* 117*  BUN _1 CREATININE 0.61 0.55 0.59  CALCIUM 9.2 9.4 9.5   LFT Recent Labs    04/14/19 0309 04/15/19 0436 04/16/19 0255  PROT 6.6 6.8 7.2  ALBUMIN 3.5 3.7 3.8  AST 208* 207* 155*  ALT 423* 481* 369*  ALKPHOS 203* 227* 264*  BILITOT 1.7* 1.9* 1.9*    Studies/Results: Mr 3d Recon At Scanner Mr Abdomen With Mrcp W Contrast  Result Date: 04/16/2019 CLINICAL DATA:  Abnormal liver function tests. Status post cholangiogram on 04/11/2019. Cholecystectomy 04/11/2019. EXAM: MRI ABDOMEN WITHOUT AND WITH CONTRAST (INCLUDING MRCP) TECHNIQUE: Multiplanar multisequence MR imaging of the abdomen was performed both before and after the administration of intravenous contrast. Heavily T2-weighted images of the biliary and pancreatic ducts were obtained, and three-dimensional MRCP images were rendered by post processing. CONTRAST:  8 cc Gadavist COMPARISON:  MRCP 04/10/2019.   Cholangiogram 04/11/2019. FINDINGS: Lower chest: Normal heart size without pericardial or pleural effusion. Hepatobiliary: Hepatomegaly at 19.7 cm craniocaudal. Marked hepatic steatosis. Cholecystectomy. No fluid collection in the operative bed. There is mild postoperative edema within the pericholecystic region of the liver on image 21/6 and image 102/11. No biliary duct dilatation or choledocholithiasis. Pancreas:  Normal, without mass or ductal dilatation. Spleen:  Normal in size, without focal abnormality. Adrenals/Urinary Tract: Normal adrenal glands. Normal kidneys, without hydronephrosis. Stomach/Bowel: Normal stomach and abdominal bowel loops. Vascular/Lymphatic: Normal caliber of the aorta and branch vessels. No retroperitoneal or retrocrural adenopathy. Other:  No ascites. Musculoskeletal: No acute osseous abnormality. IMPRESSION: 1. Status post cholecystectomy, without choledocholithiasis or other acute complication. 2. Hepatic steatosis and hepatomegaly. Electronically Signed   By: Abigail Miyamoto M.D.   On: 04/16/2019 08:53   Nm Hepato Biliary Leak  Result Date: 04/14/2019 CLINICAL DATA:  Laparoscopic cholecystectomy 04/11/2019. Persistent elevated liver enzymes with pain. EXAM: NUCLEAR MEDICINE HEPATOBILIARY IMAGING TECHNIQUE: Sequential images of the abdomen were obtained out to 60 minutes following intravenous administration of radiopharmaceutical. RADIOPHARMACEUTICALS:  5.3 mCi Tc-27m Choletec IV COMPARISON:  None. FINDINGS: Relatively brisk uptake of radiotracer noted diffusely in the liver parenchyma with perhaps slightly decreased clearance of blood pool activity. Delayed appearance of biliary activity out 2 approximately 1 hour. Common bile duct activity is noted at 95 minutes. No evidence for  radiotracer accumulation in the gallbladder fossa, over the surface of the liver, or in the peritoneal cavity/paracolic gutter. IMPRESSION: 1. Relatively delayed clearance of blood pool activity and  excretion of radiotracer although common bile duct and gut activity visible by 110 minutes. Underlying component of hepatocyte dysfunction not excluded. 2. No evidence for extra biliary accumulation of radiotracer to suggest bile leak. Electronically Signed   By: Misty Stanley M.D.   On: 04/14/2019 15:07   Scheduled Meds: . acetaminophen  650 mg Oral Q6H   Continuous Infusions: . dextrose 5 % and 0.9% NaCl Stopped (04/14/19 0344)   PRN Meds:.HYDROmorphone (DILAUDID) injection, methocarbamol, ondansetron (ZOFRAN) IV, oxyCODONE, promethazine, simethicone, traMADol   ASSESMENT:   *    Elevated LFTs, RUQ pain persisted post cholecystectomy 4/16 with some leaking on IOC, addressed with endoloops applied to cystic duct jx.   04/10/19 Ultrasound with sludge, GB stones, 7 mm CBD, increased liver echo/steatosis. 04/10/19 preop MRCP: fatty liver.  GB stones, sludge, mild GB wall thickening/?early cholecystitis, 5 to 6 mm CBD.  S/p cholecystectomy 04/11/19 04/14/19 HIDA: No bile leak, delayed excretion and late imaging of CBD and gut activity, can not r/o hepatic dysfunction.    ERCP pop day 4,  04/15/19: no leak.  + fatty liver and hepatomegaly.   LFTs, except alk phos, continue improved.  Pain overall improved.    *   DM 2?  Hx gestational diabetes.   No meds for this @ home.     PLAN   *  Still no indication for ERCP.  Restart Carb mod diet. Spoke with her about avoiding fatty and sweet foods, about potential for further liver injury and cirrhosis.    *  Home today.  Has ROV in ~ 2 weeks with gen surgery, they will get LFTs then.  Has GI ROV 5/26 and will get LFTS then.    *   Stop schedule Tylenol.       Carolyn Mendez  04/16/2019, 9:23 AM Phone Barry Attending   I have taken an interval history, reviewed the chart and examined the patient. I agree with the Advanced Practitioner's note, impression and recommendations.  I attached fatty liver instructions in Spanish to  her DC instructions.  Gatha Mayer, MD, [facility] 04/16/2019 10:54 AM Pager [phone]

## 2019-04-16 NOTE — Discharge Instructions (Signed)
CIRUGIA LAPAROSCOPICA: INSTRUCCIONES DE POST OPERATORIO. ° °Revise siempre los documentos que le entreguen en el lugar donde se ha hecho la cirugia. ° °SI USTED NECESITA DOCUMENTOS DE INCAPACIDAD (DISABLE) O DE PERMISO FAMILAR (FAMILY LEAVE) NECESITA TRAERLOS A LA OFICINA PARA QUE SEAN PROCESADOS. °NO  SE LOS DE A SU DOCTOR. °1. A su alta del hospital se le dara una receta para controlar el dolor. Tomela como ha sido recetada, si la necesita. Si no la necesita puede tomar, Acetaminofen (Tylenol) o Ibuprofen (Advil) para aliviar dolor moderado. °2. Continue tomando el resto de sus medicinas. °3. Si necesita rellenar la receta, llame a la farmacia. ellos contactan a nuestra oficina pidiendo autorizacion. Este tipo de receta no pueden ser rellenadas despues de las  5pm o durante los fines de semana. °4. Con relacion a la dieta: debe ser ligera los primeros dias despues que llege a la casa. Ejemplo: sopas y galleticas. Tome bastante liquido esos dias. °5. La mayoria de los pacientes padecen de inflamacion y cambio de coloracion de la piel alrededor de las incisiones. esto toma dias en resolver.  pnerse una bolsa de hielo en el area affectada ayuda..  °6. Es comun tambien tener un poco de estrenimiento si esta tomado medicinas para el dolor. incremente la cantidad de liquidos a tomar y puede tomar (Colace) esto previene el problema. Si ya tiene estrenimiento, es decir no ha defecado en 48 horas, puede tomar un laxativo (Milk of Magnesia or Miralax) uselo como el paquete le explica. °7.  A menos que se le diga algo diferente. Remueva el bendaje a las 24-48 horas despues dela cirugia. y puede banarse en la ducha sin ningun problema. usted puede tener steri-strips (pequenas curitas transparentes en la piel puesta encima de la incision)  Estas banditas strips should be left on the skin for 7-10 days.   Si su cirujano puso pegamento encima de la incision usted puede banarse bajo la ducha en 24 horas. Este pegamento empezara a  caerse en las proximas 2-3 semanas. Si le pusieron suturas o presillas (grapos) estos seran quitados en su proxima cita en la oficina. . °a. ACTIVIDADES:  Puede hacer actividad ligera.  Como caminar , subir escaleras y poco a poco irlas incrementando tanto como las tolere. Puede tener relaciones sexuales cuando sea comfortable. No carge objetos pesados o haga esfuerzos que no sean aprovados por su doctor. °b. Puede manejar en cuanto no esta tomando medicamentos fuertes (narcoticos) para el dolor, pueda abrochar confortablemente el cinturon de seguridad, y pueda maniobrar y usar los pedales de su vehiculo con seguridad. °c. PUEDE REGRESAR A TRABAJAR  °8. Debe ver a su doctor para una cita de seguimiento en 2-3 semanas despues de la cirugia.  °9. OTRAS ISNSTRUCCIONES:___________________________________________________________________________________ °CUANDO LLAMAR A SU MEDICO: °1. FIEBRE mayor de  101.0 °2. No produccion de orina. °3. Sangramiento continue de la herida °4. Incremento de dolor, enrojecimientio o drenaje de la herida (incision) °5. Incremento de dolor abdominal. ° °The clinic staff is available to answer your questions during regular business hours.  Please don’t hesitate to call and ask to speak to one of the nurses for clinical concerns.  If you have a medical emergency, go to the nearest emergency room or call 911.  A surgeon from [facility] is always on call at the hospital. °[address], [address], [place], [place]  [address] ? [address] [address], [place], [place]   [address] °[phone] ? [phone] ? FAX [phone] °Web site: [email or web address] ° ° °  Plan de alimentacin para problemas de vescula biliar Gallbladder Eating Plan Si tiene una afeccin de la vescula biliar, puede tener problemas para digerir las grasas. Consumir una dieta con bajo contenido de grasas puede Honeywelldisminuir los sntomas, y puede ser beneficiosa antes y despus de [place]una ciruga de  extraccin de vescula biliar (colecistectoma). El mdico puede recomendarle que trabaje con un especialista en dietas y alimentacin (nutricionista) para que lo ayude a reducir la cantidad de grasas en su dieta. Consejos para seguir este plan [place] generales  Limite el consumo de grasas a menos del 30% del total de caloras diarias. Si usted ingiere alrededor de 1800 caloras diarias, esto es menos de 60 gramos (g) de Automotive engineergrasas por da.  La grasa es una parte importante de una dieta saludable. Consumir una dieta con bajo contenido de grasas puede dificultar mantener un peso corporal saludable. Pregunte a su nutricionista qu cantidad de grasas, caloras y otros nutrientes necesita diariamente.  Haga comidas pequeas y frecuentes Freight forwarderdurante el da en lugar de tres comidas abundantes.  Beba de 8 a 10 vasos de lquido por Guardian Life Insuranceda como mnimo. Beba suficiente lquido como para mantener la orina clara o de color amarillo plido.  Limite el consumo de alcohol a no ms de 1medida por da si es mujer y no est [place], y 2medidas por da si es hombre. Una medida equivale a 12oz (355ml) de cerveza, 5oz (148ml) de vino o 1oz (44ml) de bebidas alcohlicas de alta graduacin. Lea las etiquetas de los alimentos  Consulte la informacin nutricional en las etiquetas de los alimentos para conocer la cantidad de grasas por porcin. Elija alimentos con menos de 3 gramos de grasas por porcin. De compras  Elija alimentos saludables sin grasas o con bajo contenido de Blythedalegrasas. Busque las palabras sin grasa, bajo en grasas o con bajo contenido de Hermitagegrasas.  Evite comprar alimentos procesados o envasados. Coccin  Para cocinar opte por mtodos con bajo contenido de grasa, como hornear, hervir, Software engineergrillar y Transport plannerasar.  Cocine con pequeas cantidades de grasas saludables, como aceite de [place]oliva, aceite de semilla de [place], aceite de canola o Olneygirasol. Qu alimentos se recomiendan?   Todas las frutas y verduras  frescas, congeladas o enlatadas.  Cereales integrales.  Leche y yogur semidescremados y descremados.  Vita [place] magra, aves sin piel, pescado, huevos y legumbres.  Suplementos proteicos con bajo contenido de grasas, en polvo o lquidos.  Hierbas y especias. Qu alimentos no se recomiendan?  Alimentos muy grasos. Entre estos se incluyen productos panificados, comida rpida, cortes de carne con grasa, helados, pan francs, rosquillas dulces, pizza, pan de queso, alimentos cubiertos con [place], salsas con crema o queso.  Comidas fritas. Se incluyen papas fritas, tempura, pescado rebozado, milanesas de pollo, panes fritos y dulces.  Alimentos con OGE Energyolores fuertes.  Alimentos que causan gases o meteorismo. Resumen  Una dieta de bajo contenido graso puede ser beneficiosa si tiene una afeccin de la vescula biliar o puede hacerla antes y despus de someterse a una ciruga de vescula.  Limite el consumo de grasas a menos del 30% del total de caloras diarias. Esto es casi 60 gramos de grasa si usted ingiere 1800 caloras diarias.  Haga comidas pequeas y frecuentes Freight forwarderdurante el da en lugar de tres comidas abundantes. Esta informacin no tiene Theme park managercomo fin reemplazar el consejo del mdico. Asegrese de hacerle al mdico cualquier pregunta que tenga. Document Released: 12/17/2013 Document Revised: 07/18/2017 Document Reviewed: 07/18/2017 Elsevier Interactive Patient Education  2019 [employer]   FayEnfermedad  del hgado graso Fatty Liver Disease  La enfermedad del hgado graso ocurre cuando se acumula demasiada grasa en las clulas del hgado. La enfermedad del hgado graso tambin se llama esteatosis heptica o esteatohepatitis. El hgado elimina las sustancias dainas del torrente sanguneo y produce lquidos que el cuerpo necesita. Tambin ayuda al cuerpo a Chemical engineer y Academic librarian la energa obtenida de los alimentos que come. En muchos casos, la enfermedad del hgado graso no provoca sntomas ni  problemas. Con frecuencia, se diagnostica cuando se realizan estudios por otros motivos. Sin embargo, con el [place], el hgado graso puede provocar una inflamacin que posiblemente cause problemas hepticos ms graves, como la fibrosis heptica (cirrosis) o insuficiencia heptica. El hgado graso se asocia con la resistencia a la insulina, el aumento de la grasa corporal, la presin arterial alta (hipertensin) y el colesterol elevado. Estas son caractersticas del sndrome metablico y [place] el riesgo de accidente cerebrovascular, diabetes y enfermedad cardaca. Cules son las causas? Esta afeccin puede ser causada por lo siguiente:  Beber alcohol en exceso.  Mala alimentacin.  Obesidad.  Sndrome de [place].  Diabetes.  Colesterol alto.  Determinados medicamentos.  Txicos.  Algunas infecciones virales.  Embarazo. Qu incrementa el riesgo? Es ms probable que tenga esta afeccin si:  Consume alcohol en exceso.  Tiene sobrepeso.  Tiene diabetes.  Tiene hepatitis.  Tiene un nivel alto de triglicridos.  Est embarazada. Cules son los signos o los sntomas? Con frecuencia, la enfermedad del hgado graso no provoca sntomas. Si se desarrollan sntomas, estos pueden incluir:  Fatiga.  Debilidad.  Prdida de peso.  Confusin.  Dolor abdominal.  Nuseas y vmitos.  Color amarillo en la piel y en la zona blanca de los ojos (ictericia).  Picazn en la piel. Cmo se diagnostica? Esta afeccin puede diagnosticarse mediante:  Un examen fsico y antecedentes mdicos.  Anlisis de [place].  Estudios de diagnstico por imgenes, como ecografa, exploracin por tomografa computarizada (TC) o Health visitor (RM).  Biopsia de hgado. Se extrae una pequea muestra de tejido del hgado usando [place]. La muestra se examina en el microscopio. Cmo se trata? Con frecuencia, la enfermedad del hgado graso es causada por otras afecciones. El tratamiento  para el hgado graso puede incluir medicamentos y cambios en el estilo de vida para controlar enfermedades como:  Alcoholismo.  Colesterol alto.  Diabetes.  Tener exceso de [place] u obesidad. Siga estas indicaciones en su casa:   No beba alcohol. Si tiene problemas para dejar de beber, consulte al mdico cmo puede dejar de beber de forma segura con la ayuda de medicamentos o un programa con supervisin. Esto es importante para evitar que la afeccin empeore.  Siga una dieta saludable segn lo indicado por su mdico. Consulte al mdico sobre trabajar con un especialista en alimentacin y nutricin (nutricionista) a fin de Public librarian plan de alimentacin.  Haga ejercicio regularmente. Esto puede ayudarlo a [employer], y a Public house manager y la diabetes. Hable con el mdico sobre qu actividades son mejores para usted y IT trainer de ejercicios.  Tome los medicamentos de venta libre y los recetados solamente como se lo haya indicado el mdico.  Oceanographer a todas las visitas de control como se lo haya indicado el mdico. Esto es importante. Comunquese con un mdico si: Tiene dificultad para controlar lo siguiente:  Nivel de Banker. Esto es muy importante si tiene diabetes.  El colesterol.  El consumo de alcohol. Solicite ayuda de inmediato si:  Siente dolor abdominal.  Tiene ictericia.  Tiene nuseas y vmitos.  Vomita sangre de color rojo brillante o una sustancia similar a los granos de caf.  Las heces son negras, alquitranadas o sanguinolentas. Resumen  La enfermedad del hgado graso se desarrolla cuando se acumula demasiada grasa en las clulas del hgado.  Con frecuencia, la enfermedad del hgado no causa sntomas ni problemas. Sin embargo, con Museum/gallery conservator, el hgado graso puede provocar una inflamacin que puede causar problemas hepticos ms graves, como fibrosis heptica (cirrosis).  Es ms probable que desarrolle esta afeccin si  consume alcohol en exceso, est embarazada, tiene sobrepeso, diabetes, hepatitis o altos niveles de triglicridos.  Comunquese con el mdico si tiene problemas para [employer], los niveles de azcar en la sangre, el colesterol o el consumo de alcohol. Esta informacin no tiene Theme park manager el consejo del mdico. Asegrese de hacerle al mdico cualquier pregunta que tenga. Document Released: 10/02/2013 Document Revised: 12/11/2017 Document Reviewed: 12/11/2017 Elsevier Interactive Patient Education  2019 [employer].

## 2019-04-16 NOTE — Progress Notes (Signed)
Patient has been given verbal and written instructions via Spanish interpreter and written Spanish instructions.  The patient verbalizes understanding and picking up her prescription.  The patient has ordered lunch and will see how she tolerates her meal.

## 2019-04-16 NOTE — Plan of Care (Signed)
  Problem: Activity: Goal: Risk for activity intolerance will decrease Outcome: Progressing   Problem: Safety: Goal: Ability to remain free from injury will improve Outcome: Progressing   Problem: Skin Integrity: Goal: Risk for impaired skin integrity will decrease Outcome: Progressing   

## 2019-04-16 NOTE — Discharge Summary (Addendum)
[facility] Discharge Summary   Patient ID: Carolyn Mendez MRN: [id number] DOB/AGE: 42/06/1976 42 y.o.  Admit date: 04/10/2019 Discharge date: 04/16/2019  Admitting Diagnosis: Symptomatic cholelithiasis  Discharge Diagnosis Acute cholecystitis Hepatic steatosis Transaminitis  Consultants Gastroenterology  Imaging: Mr 3d Recon At Scanner  Result Date: 04/16/2019 CLINICAL DATA:  Abnormal liver function tests. Status post cholangiogram on 04/11/2019. Cholecystectomy 04/11/2019. EXAM: MRI ABDOMEN WITHOUT AND WITH CONTRAST (INCLUDING MRCP) TECHNIQUE: Multiplanar multisequence MR imaging of the abdomen was performed both before and after the administration of intravenous contrast. Heavily T2-weighted images of the biliary and pancreatic ducts were obtained, and three-dimensional MRCP images were rendered by post processing. CONTRAST:  8 cc Gadavist COMPARISON:  MRCP 04/10/2019.  Cholangiogram 04/11/2019. FINDINGS: Lower chest: Normal heart size without pericardial or pleural effusion. Hepatobiliary: Hepatomegaly at 19.7 cm craniocaudal. Marked hepatic steatosis. Cholecystectomy. No fluid collection in the operative bed. There is mild postoperative edema within the pericholecystic region of the liver on image 21/6 and image 102/11. No biliary duct dilatation or choledocholithiasis. Pancreas:  Normal, without mass or ductal dilatation. Spleen:  Normal in size, without focal abnormality. Adrenals/Urinary Tract: Normal adrenal glands. Normal kidneys, without hydronephrosis. Stomach/Bowel: Normal stomach and abdominal bowel loops. Vascular/Lymphatic: Normal caliber of the aorta and branch vessels. No retroperitoneal or retrocrural adenopathy. Other:  No ascites. Musculoskeletal: No acute osseous abnormality. IMPRESSION: 1. Status post cholecystectomy, without choledocholithiasis or other acute complication. 2. Hepatic steatosis and hepatomegaly. Electronically Signed   By: Jeronimo GreavesKyle  Talbot M.D.    On: 04/16/2019 08:53   Mr Abdomen With Mrcp W Contrast  Result Date: 04/16/2019 CLINICAL DATA:  Abnormal liver function tests. Status post cholangiogram on 04/11/2019. Cholecystectomy 04/11/2019. EXAM: MRI ABDOMEN WITHOUT AND WITH CONTRAST (INCLUDING MRCP) TECHNIQUE: Multiplanar multisequence MR imaging of the abdomen was performed both before and after the administration of intravenous contrast. Heavily T2-weighted images of the biliary and pancreatic ducts were obtained, and three-dimensional MRCP images were rendered by post processing. CONTRAST:  8 cc Gadavist COMPARISON:  MRCP 04/10/2019.  Cholangiogram 04/11/2019. FINDINGS: Lower chest: Normal heart size without pericardial or pleural effusion. Hepatobiliary: Hepatomegaly at 19.7 cm craniocaudal. Marked hepatic steatosis. Cholecystectomy. No fluid collection in the operative bed. There is mild postoperative edema within the pericholecystic region of the liver on image 21/6 and image 102/11. No biliary duct dilatation or choledocholithiasis. Pancreas:  Normal, without mass or ductal dilatation. Spleen:  Normal in size, without focal abnormality. Adrenals/Urinary Tract: Normal adrenal glands. Normal kidneys, without hydronephrosis. Stomach/Bowel: Normal stomach and abdominal bowel loops. Vascular/Lymphatic: Normal caliber of the aorta and branch vessels. No retroperitoneal or retrocrural adenopathy. Other:  No ascites. Musculoskeletal: No acute osseous abnormality. IMPRESSION: 1. Status post cholecystectomy, without choledocholithiasis or other acute complication. 2. Hepatic steatosis and hepatomegaly. Electronically Signed   By: Jeronimo GreavesKyle  Talbot M.D.   On: 04/16/2019 08:53   Nm Hepato Biliary Leak  Result Date: 04/14/2019 CLINICAL DATA:  Laparoscopic cholecystectomy 04/11/2019. Persistent elevated liver enzymes with pain. EXAM: NUCLEAR MEDICINE HEPATOBILIARY IMAGING TECHNIQUE: Sequential images of the abdomen were obtained out to 60 minutes following  intravenous administration of radiopharmaceutical. RADIOPHARMACEUTICALS:  5.3 mCi [id number]  Choletec IV COMPARISON:  None. FINDINGS: Relatively brisk uptake of radiotracer noted diffusely in the liver parenchyma with perhaps slightly decreased clearance of blood pool activity. Delayed appearance of biliary activity out 2 approximately 1 hour. Common bile duct activity is noted at 95 minutes. No evidence for radiotracer accumulation in the gallbladder fossa, over the surface of the liver, or in the  peritoneal cavity/paracolic gutter. IMPRESSION: 1. Relatively delayed clearance of blood pool activity and excretion of radiotracer although common bile duct and gut activity visible by 110 minutes. Underlying component of hepatocyte dysfunction not excluded. 2. No evidence for extra biliary accumulation of radiotracer to suggest bile leak. Electronically Signed   By: Kennith Center M.D.   On: 04/14/2019 15:07    Procedures Dr. Derrell Lolling (04/11/19) - Laparoscopic Cholecystectomy   Hospital Course:  Patient is a 43 year old female who [facility] to [facility] with abdominal pain.  Workup showed symptomatic cholelithiasis with possible choledocholithiasis.  Patient was admitted and underwent procedure listed above. Cholangiogram was attempted but unable to be completed. Tolerated procedure well and was transferred to the floor.  Diet was advanced as tolerated. Total bilirubin elevated after surgery and GI was consulted for possible ERCP. HIDA 4/19 was negative for bile leak but LFTs and Tbili remained elevated. Repeat MRCP obtained 4/21 and showed no obstruction or obvious stricture in common bile duct, but did confirm hepatic steatosis. Case discussed with GI attending who agreed patient was stable for discharge with repeat labs and outpatient follow up in a few weeks.   On POD#5, the patient was voiding well, tolerating diet, ambulating well, pain well controlled, vital signs stable, incisions c/d/i and felt stable for  discharge home.  Patient will follow up in our office in 1- 2 weeks and knows to call with questions or concerns.  She will call to confirm appointment date/time.    Physical Exam: General:  Alert, NAD, pleasant, comfortable Abd:  Soft, ND, mild tenderness, incisions C/D/I  Allergies as of 04/16/2019   Not on File     Medication List    STOP taking these medications   predniSONE 20 MG tablet Commonly known as:  DELTASONE     TAKE these medications   ibuprofen 600 MG tablet Commonly known as:  ADVIL Take 1 tablet (600 mg total) by mouth every 8 (eight) hours as needed.   PrePLUS 27-1 MG Tabs Take 1 tablet daily by mouth.   traMADol 50 MG tablet Commonly known as:  ULTRAM Take 1 tablet (50 mg total) by mouth every 6 (six) hours as needed (mild pain).        Follow-up Information    [facility], [place]. Go on 04/25/2019.   Specialty:  General Surgery Why:  Appointment scheduled for 04/30 at 2:45pm. Please arrive 30 min prior to appointment time. Bring photo ID and insurance information with you. You should go prior to your appointment to lab for blood draw.  Contact information: [address] [place] [address] [phone]       [facility] Follow up.   Why:  Go 1-2 days prior to general surgery follow up appointment for blood draw. If they have any issue finding the order, please have them call [facility] [place] [facility].  Contact information: [address] [place], [place] [address] [phone]       Beverley Fiedler, MD Follow up on 05/21/2019.   Specialty:  Gastroenterology Why:  2 PM.  follow up with liver doctor.   Contact information: [address] [place] [address] [phone]           Signed: Wells Guiles, [facility] 04/16/2019, 2:10 PM Pager: [phone] Consults: [phone]

## 2019-04-26 ENCOUNTER — Telehealth: Payer: Self-pay

## 2019-04-26 DIAGNOSIS — R7989 Other specified abnormal findings of blood chemistry: Secondary | ICD-10-CM

## 2019-04-26 DIAGNOSIS — R945 Abnormal results of liver function studies: Principal | ICD-10-CM

## 2019-04-26 DIAGNOSIS — R109 Unspecified abdominal pain: Secondary | ICD-10-CM

## 2019-04-26 DIAGNOSIS — K760 Fatty (change of) liver, not elsewhere classified: Secondary | ICD-10-CM

## 2019-04-26 NOTE — Telephone Encounter (Signed)
Received a phone call from [facility]. Patient with elevated LFT trending back up.  She was seen at [facility] yesterday and they wanted labs to be reviewed by [facility] to determine next steps.  Contacted the patient, she denies pain, jaundice, nausea or vomiting.  She does report that she has pain RUQ when she eats solid foods and this pain can last for up to 8 hours.  Dr. Orvan Falconer please see notes from [facility] as well as labs and advise if she needs anything at this time.

## 2019-04-26 NOTE — Telephone Encounter (Signed)
Patient notified via Spanish Interpreter Alice Reichert, [facility] of MRI scheduled for 05/01/19 at 8:00.  MRI is scheduled for 8:00 she is asked to arrive at [facility] at 7:30 and be NPO after midnight.  Follow up appt rescheduled to Dr. Leone Payor on 05/02/19.  Patient verbalized understanding of all instructions.

## 2019-04-26 NOTE — Addendum Note (Signed)
Addended by: Annett Fabian on: 04/26/2019 04:51 PM   Modules accepted: Orders

## 2019-04-26 NOTE — Telephone Encounter (Signed)
[facility] office note and labs reviewed. I will ask that they be scanned in EPIC. Labs 04/25/19 showed TB 2.1, AST 203, ALT 485, alk phos 437. I recommend an MRI/MRCP be arranged now. Follow-up with [facility] MD after the MRI/MRCP. She should go to the ED with any escalating symptoms as hospitalization may be most appropriate. Thank you.

## 2019-04-29 ENCOUNTER — Other Ambulatory Visit: Payer: Self-pay | Admitting: Gastroenterology

## 2019-04-29 DIAGNOSIS — R7989 Other specified abnormal findings of blood chemistry: Secondary | ICD-10-CM

## 2019-04-29 DIAGNOSIS — R945 Abnormal results of liver function studies: Principal | ICD-10-CM

## 2019-04-29 DIAGNOSIS — R109 Unspecified abdominal pain: Secondary | ICD-10-CM

## 2019-04-29 DIAGNOSIS — K760 Fatty (change of) liver, not elsewhere classified: Secondary | ICD-10-CM

## 2019-05-01 ENCOUNTER — Other Ambulatory Visit: Payer: Self-pay

## 2019-05-01 ENCOUNTER — Ambulatory Visit (HOSPITAL_COMMUNITY)
Admission: RE | Admit: 2019-05-01 | Discharge: 2019-05-01 | Disposition: A | Discharge status: Home / Self Care | Payer: Self-pay | Source: Ambulatory Visit | Attending: Gastroenterology | Admitting: Gastroenterology

## 2019-05-01 DIAGNOSIS — R945 Abnormal results of liver function studies: Secondary | ICD-10-CM | POA: Insufficient documentation

## 2019-05-01 DIAGNOSIS — R7989 Other specified abnormal findings of blood chemistry: Secondary | ICD-10-CM

## 2019-05-01 DIAGNOSIS — K760 Fatty (change of) liver, not elsewhere classified: Secondary | ICD-10-CM | POA: Insufficient documentation

## 2019-05-01 DIAGNOSIS — R109 Unspecified abdominal pain: Secondary | ICD-10-CM | POA: Insufficient documentation

## 2019-05-01 IMAGING — MR MR ABDOMEN WO/W CM MRCP
11 of 23 series · 21 of 48 positions shown · IV contrast (gadavist)
Comparison: [DATE]

CLINICAL DATA: Abdominal pain and elevated liver function tests.
Fatty liver. 3 weeks postop from laparoscopic cholecystectomy.

EXAM:
MRI ABDOMEN WITHOUT AND WITH CONTRAST (INCLUDING MRCP)
TECHNIQUE: Multiplanar multisequence MR imaging of the abdomen was performed
both before and after the administration of intravenous contrast.
Heavily T2-weighted images of the biliary and pancreatic ducts were
obtained, and three-dimensional MRCP images were rendered by post
processing.
CONTRAST:  8 mL Gadavist

[Series 3: T2 fat-sat · axial · 5.0mm · 0.94mm/px · z∈[-97,+193]mm · 2 of 59 slices shown]
[im 1/59]
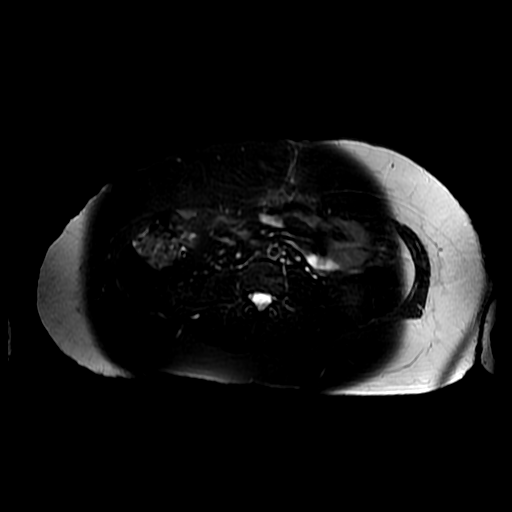
[im 59/59]
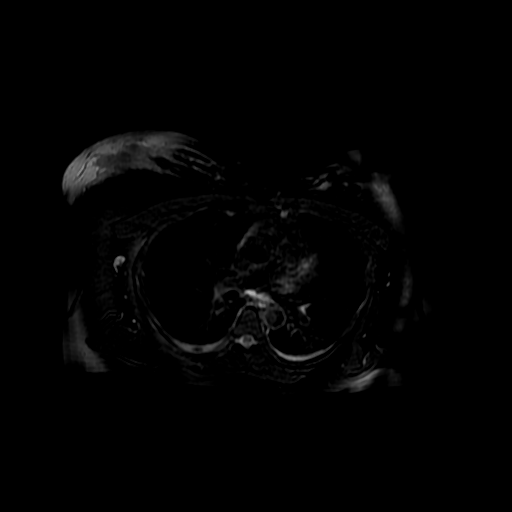

[Series 5: MRCP · coronal · 2.0mm · 0.70mm/px · 2 of 63 slices shown (1 of 2)]
[im 1/63]
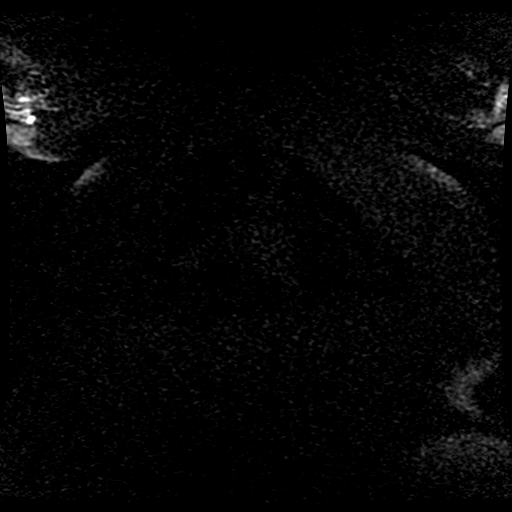
[im 63/63]
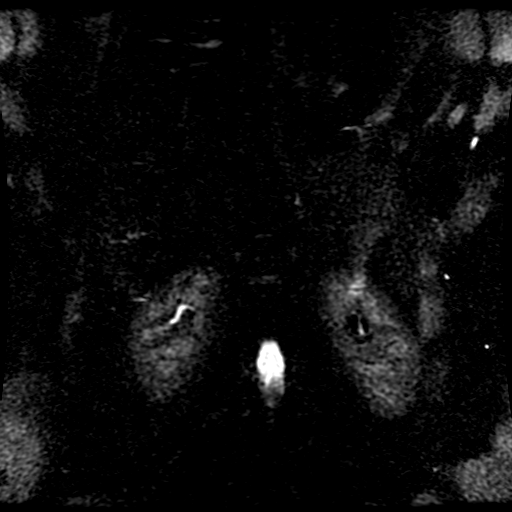

[Series 6: DWI b500 · axial · 6.0mm · 1.76mm/px · z∈[-136,+191]mm · 2 of 86 slices shown]
[im 1/86]
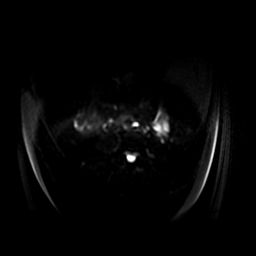
[im 86/86]
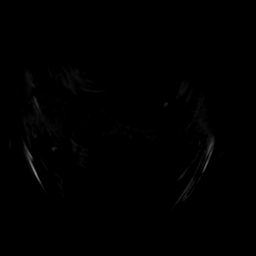

[Series 7: bSSFP fat-sat · coronal · 5.0mm · 0.82mm/px · 1 of 43 slices shown]
[im 1/43]
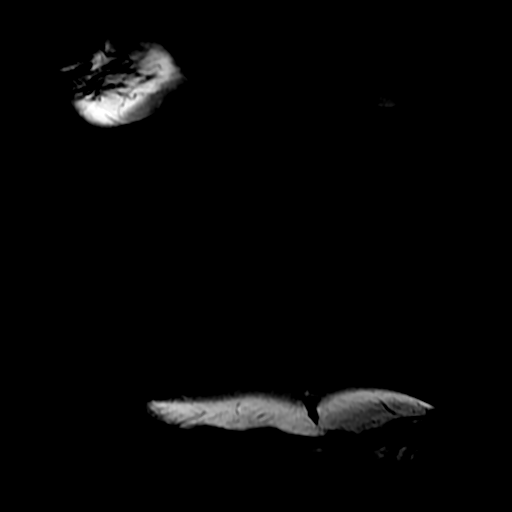

[Series 8: T2 · axial · 5.0mm · 0.94mm/px · 1 of 59 slices shown]
[im 1/59]
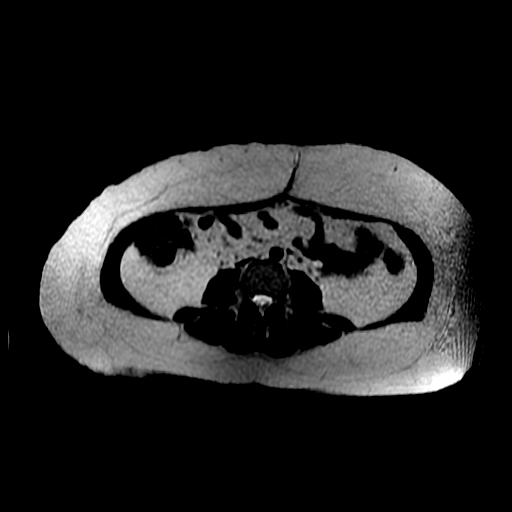

[Series 9: ax dualecho bh · axial · 5.0mm · 0.94mm/px · z∈[-97,+193]mm · 3 of 118 slices shown]
[im 1/118]
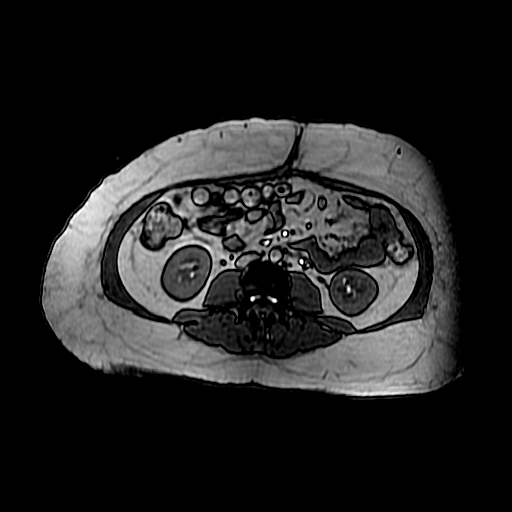
[im 59/118]
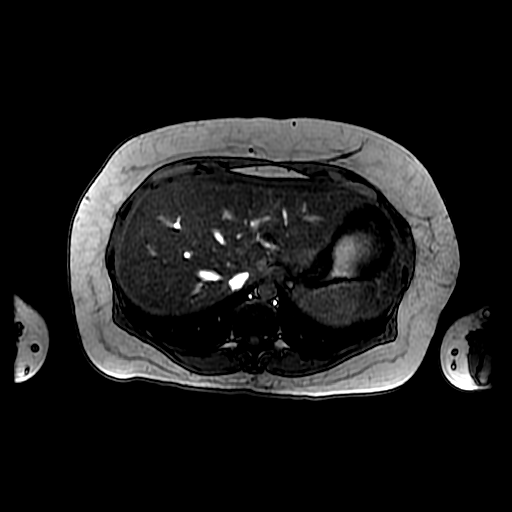
[im 118/118]
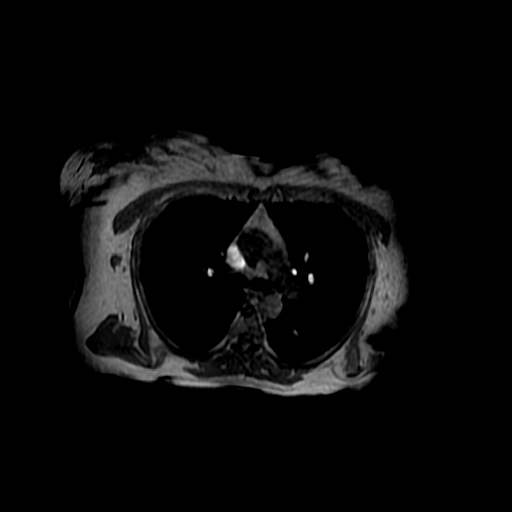

[Series 10: MRCP · coronal · 40.0mm · 0.70mm/px · 1 of 7 slices shown (2 of 2)]
[im 1/7]
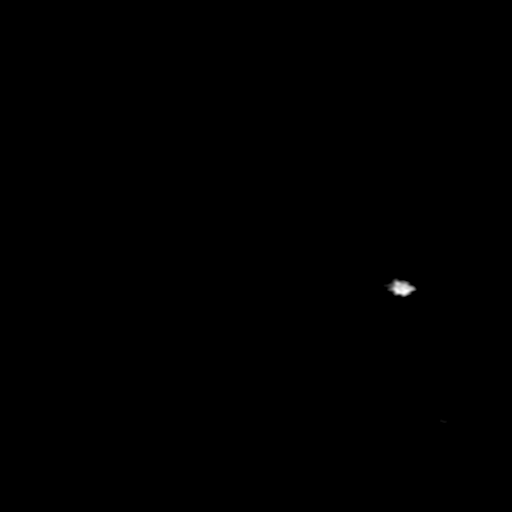

[Series 12: T1 dynamic · coronal · delayed · 9.0mm · 0.82mm/px · 2 of 80 slices shown]
[im 1/80]
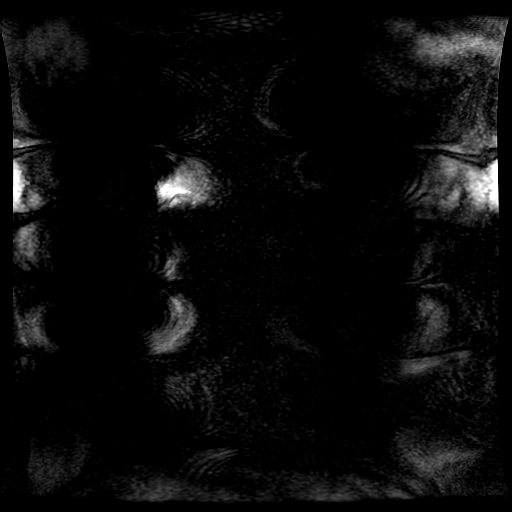
[im 80/80]
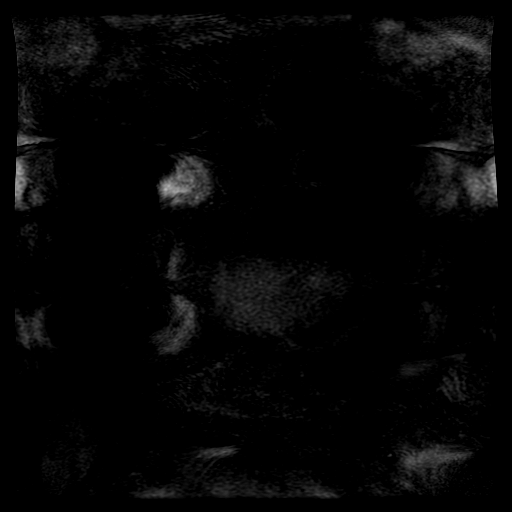

[Series 400: reformatted · axial · 1.6mm · 0.62mm/px · z∈[+42,+172]mm · 3 of 117 slices shown (1 of 2)]
[im 1/117]
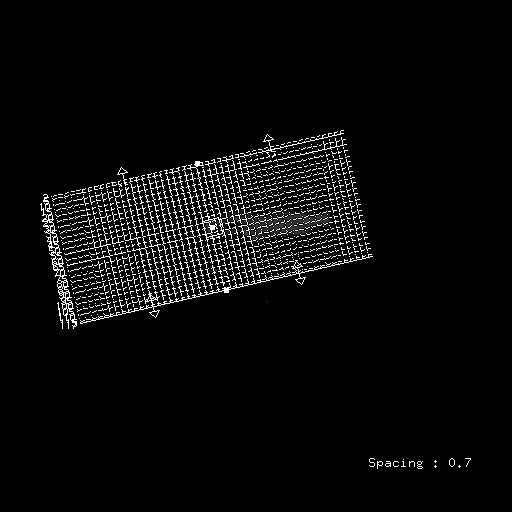
[im 59/117]
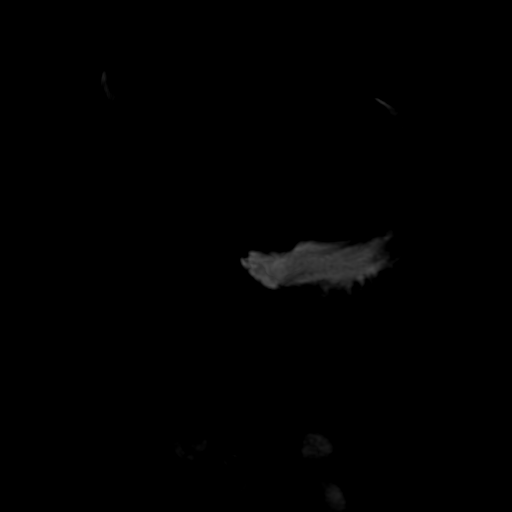
[im 117/117]
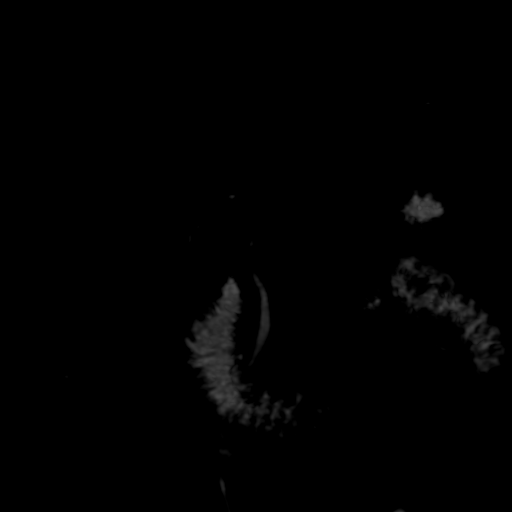

[Series 401: reformatted · axial · 1.6mm · 0.62mm/px · z∈[+42,+172]mm · 3 of 123 slices shown (2 of 2)]
[im 1/123]
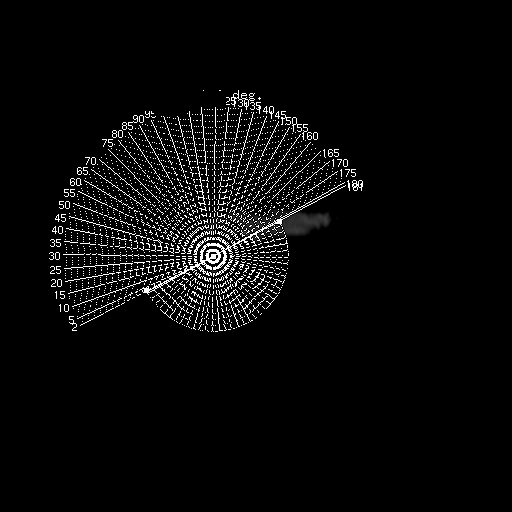
[im 62/123]
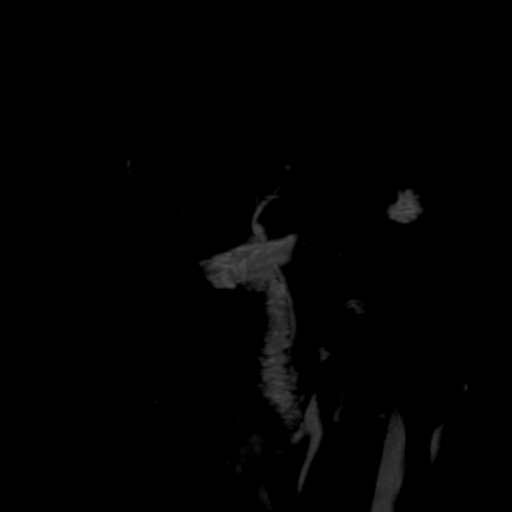
[im 123/123]
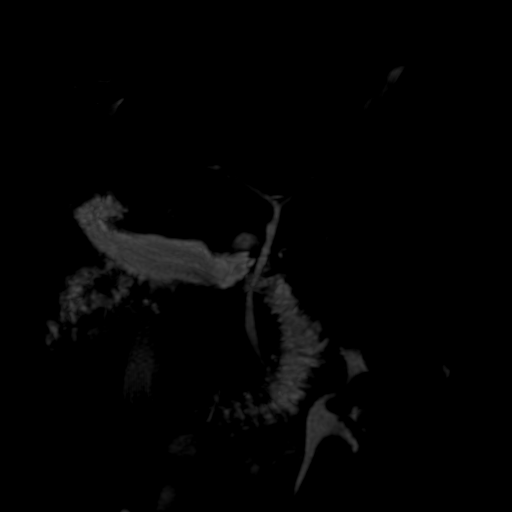

[Series 600: DWI · axial · 6.0mm · 1.76mm/px · 1 of 43 slices shown]
[im 1/43]
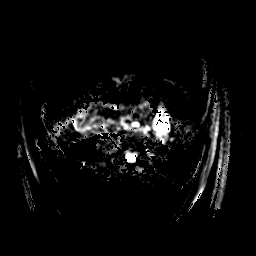

[21 of 48 positions shown; findings below may reference images not displayed]

FINDINGS: Lower chest: Tiny bilateral pleural effusions again noted.

Hepatobiliary: Stable moderate diffuse hepatic steatosis. No hepatic
masses identified. Expected postop changes from cholecystectomy. No
abnormal fluid collections identified. No evidence of biliary ductal
dilatation, with common bile duct measuring 5 mm in diameter. No
evidence of choledocholithiasis or biliary stricture.

Pancreas: No mass or inflammatory changes. No evidence of pancreatic
ductal dilatation or pancreas divisum.

Spleen:  Within normal limits in size and appearance.

Adrenals/Urinary Tract: No masses identified. No evidence of
hydronephrosis.

Stomach/Bowel: Visualized portion unremarkable.

Vascular/Lymphatic: No pathologically enlarged lymph nodes
identified. No abdominal aortic aneurysm.

Other:  None.

Musculoskeletal:  No suspicious bone lesions identified.
IMPRESSION: Prior cholecystectomy. No evidence of complication or other acute
findings. No evidence of biliary ductal dilatation or
choledocholithiasis.

Moderate diffuse hepatic steatosis.

## 2019-05-01 MED ORDER — GADOBUTROL 1 MMOL/ML IV SOLN
8.0000 mL | Freq: Once | INTRAVENOUS | Status: AC | PRN
  Administered 2019-05-01: 09:00:00 8 mL via INTRAVENOUS

## 2019-05-02 ENCOUNTER — Ambulatory Visit (INDEPENDENT_AMBULATORY_CARE_PROVIDER_SITE_OTHER): Payer: Self-pay | Admitting: Internal Medicine

## 2019-05-02 ENCOUNTER — Encounter: Payer: Self-pay | Admitting: Internal Medicine

## 2019-05-02 VITALS — Ht 63.0 in | Wt 190.0 lb

## 2019-05-02 DIAGNOSIS — K760 Fatty (change of) liver, not elsewhere classified: Secondary | ICD-10-CM

## 2019-05-02 DIAGNOSIS — R1012 Left upper quadrant pain: Secondary | ICD-10-CM | POA: Insufficient documentation

## 2019-05-02 NOTE — Assessment & Plan Note (Addendum)
Continue pantoprazole for now Has Hx IBS also apparently

## 2019-05-02 NOTE — Assessment & Plan Note (Addendum)
Reviewed again - had reviewed in hospital and provided Spanish instructions plus diet plan. Recent MRCP confirms this is present and I believe driving her abnl LFT's.  If they are persistently significantly elevated may need some other serologic work-up.  She has not had any infectious hepatitis work-up done.  That seems unlikely but remains possible.  We talked about weight loss.  She has a history of gestational diabetes and was on glyburide in the past and is wondering if she should go on that.  I would not use that but consider metformin. Orders Placed This Encounter  Procedures  . Comp Met (CMET)    Standing Status:   Future    Standing Expiration Date:   06/02/2019  . HgB A1c    Standing Status:   Future    Standing Expiration Date:   06/02/2019

## 2019-05-02 NOTE — Progress Notes (Signed)
TELEHEALTH ENCOUNTER IN SETTING OF COVID-19 PANDEMIC - REQUESTED BY PATIENT SERVICE PROVIDED BY TELEMEDECINE - TYPE: phone PATIENT LOCATION: home PATIENT HAS CONSENTED TO TELEHEALTH VISIT PROVIDER LOCATION: OFFICE REFERRING PROVIDER: A. Rosendo Gros PARTICIPANTS OTHER THAN PATIENT:Julie Sowell interpreter TIME SPENT ON CALL:15 mins    Carolyn Mendez 42 y.o. 1976/04/20 [id number]  Assessment & Plan:  LUQ pain Continue pantoprazole for now Has Hx IBS also apparently   Fatty liver Reviewed again - had reviewed in hospital and provided Spanish instructions plus diet plan. Recent MRCP confirms this is present and I believe driving her abnl LFT's.  If they are persistently significantly elevated may need some other serologic work-up.  She has not had any infectious hepatitis work-up done.  That seems unlikely but remains possible.  We talked about weight loss.  She has a history of gestational diabetes and was on glyburide in the past and is wondering if she should go on that.  I would not use that but consider metformin. Orders Placed This Encounter  Procedures   Comp Met (CMET)    Standing Status:   Future    Standing Expiration Date:   06/02/2019   HgB A1c    Standing Status:   Future    Standing Expiration Date:   06/02/2019       Subjective:   Chief Complaint: fatty liver, abd pain  HPI The patient is seen through telehealth with the assistance of interpreter, phone call, because of fatty liver, abnormal LFTs and abdominal pain.  She had a laparoscopic cholecystectomy last month.  She was seen at that time, in the hospital.  Preoperative MRCP was negative for stones in the bile duct, operative cholangiogram was negative, she had pain and problems afterwards a negative HIDA scan for leak.  She then had a number MRI without stones but all of the studies demonstrated fatty liver.  Her LFTs are persistently elevated but were trending down.  Because of persistent symptoms she just  had another MRI MRCP that was negative.  Fatty liver still.  Last known labs were AST 155 ALT 369 alk phos 264 and total bilirubin 1.9.  She was complaining of some epigastric pain to Dr. Rosendo Gros on a follow-up visit of May 4 and he prescribed pantoprazole and she thinks that is helping.  She was released to return to work at [employer], she is having some left upper quadrant pain as well and she is asking today if it is okay to return to work.  We told her that if the surgeon released her it is fine. No Known Allergies Current Meds  Medication Sig   pantoprazole (PROTONIX) 40 MG tablet Take 40 mg by mouth daily.   traMADol (ULTRAM) 50 MG tablet Take by mouth every 12 (twelve) hours as needed.   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 03/08/2018   Symptomatic cholelithiasis 04/10/2019   Past Surgical History:  Procedure Laterality Date   CESAREAN SECTION     VBAC x2   CESAREAN SECTION N/A 03/15/2018   Procedure: CESAREAN SECTION;  Surgeon: Sloan Leiter, MD;  Location: [facility];  Service: Obstetrics;  Laterality: N/A;   CHOLECYSTECTOMY N/A 04/11/2019   Procedure: LAPAROSCOPIC CHOLECYSTECTOMY WITH INTRAOPERATIVE CHOLANGIOGRAM;  Surgeon: Ralene Ok, MD;  Location: [facility];  Service: General;  Laterality: N/A;   Social  History   Social History Narrative   Works at [employer] w/ 5 childern born  2000, 2002, 2005 2011 2019   No alcohol tobacco drugs   family history includes Hyperlipidemia in her mother and sister; Kidney disease in her mother; Osteoporosis in her sister.   Review of Systems See HPI

## 2019-05-02 NOTE — Patient Instructions (Signed)
It was good to speak to you today.  We are checking your liver chemistries your sugar and your hemoglobin A1c with the labs.  We will contact you next week with those results.

## 2019-05-03 ENCOUNTER — Other Ambulatory Visit (INDEPENDENT_AMBULATORY_CARE_PROVIDER_SITE_OTHER): Payer: Self-pay

## 2019-05-03 DIAGNOSIS — K760 Fatty (change of) liver, not elsewhere classified: Secondary | ICD-10-CM

## 2019-05-03 LAB — COMPREHENSIVE METABOLIC PANEL
ALT: 103 U/L — ABNORMAL HIGH (ref 0–35)
AST: 29 U/L (ref 0–37)
Albumin: 4.5 g/dL (ref 3.5–5.2)
Alkaline Phosphatase: 230 U/L — ABNORMAL HIGH (ref 39–117)
BUN: 13 mg/dL (ref 6–23)
CO2: 25 mEq/L (ref 19–32)
Calcium: 9.4 mg/dL (ref 8.4–10.5)
Chloride: 106 mEq/L (ref 96–112)
Creatinine, Ser: 0.61 mg/dL (ref 0.40–1.20)
GFR: 107.69 mL/min (ref >60.00)
Glucose, Bld: 111 mg/dL — ABNORMAL HIGH (ref 70–99)
Potassium: 4 mEq/L (ref 3.5–5.1)
Sodium: 141 mEq/L (ref 135–145)
Total Bilirubin: 1 mg/dL (ref 0.2–1.2)
Total Protein: 7.2 g/dL (ref 6.0–8.3)

## 2019-05-03 LAB — HEMOGLOBIN A1C: Hgb A1c MFr Bld: 6 % (ref 4.6–6.5)

## 2019-05-06 ENCOUNTER — Other Ambulatory Visit: Payer: Self-pay

## 2019-05-06 DIAGNOSIS — R1012 Left upper quadrant pain: Secondary | ICD-10-CM

## 2019-05-06 DIAGNOSIS — K760 Fatty (change of) liver, not elsewhere classified: Secondary | ICD-10-CM

## 2019-05-06 NOTE — Progress Notes (Signed)
atic

## 2019-05-06 NOTE — Progress Notes (Signed)
Needs Spanish speaker to inform  1) Liver labs much better but still somewhat abnormal 2) Hgb A1 C is a measure of average blood sugar over past 3 months and is very close to diabetes range   Have her set up a f/u me late June and try to do LFT a few days before the visit  She should see PCP about this pre-diabetesand general health

## 2019-05-21 ENCOUNTER — Ambulatory Visit: Payer: Self-pay | Admitting: Internal Medicine

## 2019-07-05 ENCOUNTER — Other Ambulatory Visit (INDEPENDENT_AMBULATORY_CARE_PROVIDER_SITE_OTHER): Payer: Self-pay

## 2019-07-05 DIAGNOSIS — K760 Fatty (change of) liver, not elsewhere classified: Secondary | ICD-10-CM

## 2019-07-05 DIAGNOSIS — R1012 Left upper quadrant pain: Secondary | ICD-10-CM

## 2019-07-05 LAB — HEPATIC FUNCTION PANEL
ALT: 30 U/L (ref 0–35)
AST: 21 U/L (ref 0–37)
Albumin: 4.2 g/dL (ref 3.5–5.2)
Alkaline Phosphatase: 114 U/L (ref 39–117)
Bilirubin, Direct: 0.1 mg/dL (ref 0.0–0.3)
Total Bilirubin: 1 mg/dL (ref 0.2–1.2)
Total Protein: 6.8 g/dL (ref 6.0–8.3)

## 2019-07-06 NOTE — Progress Notes (Signed)
Let her know LFT's all back to normal  She may set up an OV if desired to review her sxs, etc  Would recheck LFT 3 mos dx fatty liver

## 2019-07-08 ENCOUNTER — Other Ambulatory Visit: Payer: Self-pay

## 2019-07-08 DIAGNOSIS — R7989 Other specified abnormal findings of blood chemistry: Secondary | ICD-10-CM

## 2019-07-08 DIAGNOSIS — K760 Fatty (change of) liver, not elsewhere classified: Secondary | ICD-10-CM

## 2019-10-13 ENCOUNTER — Ambulatory Visit (HOSPITAL_COMMUNITY)
Admission: EM | Admit: 2019-10-13 | Discharge: 2019-10-13 | Disposition: A | Discharge status: Home / Self Care | Payer: Self-pay | Attending: Emergency Medicine | Admitting: Emergency Medicine

## 2019-10-13 ENCOUNTER — Other Ambulatory Visit: Payer: Self-pay

## 2019-10-13 ENCOUNTER — Encounter (HOSPITAL_COMMUNITY): Payer: Self-pay

## 2019-10-13 DIAGNOSIS — R300 Dysuria: Secondary | ICD-10-CM

## 2019-10-13 DIAGNOSIS — N3001 Acute cystitis with hematuria: Secondary | ICD-10-CM

## 2019-10-13 LAB — POCT URINALYSIS DIP (DEVICE)
Bilirubin Urine: NEGATIVE
Glucose, UA: NEGATIVE mg/dL
Ketones, ur: NEGATIVE mg/dL
Nitrite: NEGATIVE
Protein, ur: 100 mg/dL — AB
Specific Gravity, Urine: 1.025 (ref 1.005–1.030)
Urobilinogen, UA: 0.2 mg/dL (ref 0.0–1.0)
pH: 5.5 (ref 5.0–8.0)

## 2019-10-13 MED ORDER — NITROFURANTOIN MONOHYD MACRO 100 MG PO CAPS: 100.0000 mg | ORAL_CAPSULE | Freq: Two times a day (BID) | ORAL | 0 refills | Status: AC

## 2019-10-13 NOTE — ED Triage Notes (Signed)
Pt present urinary frequency with burning sensation after urinating. Pt also noticed blood in her urinary.  Pt states symptoms started on Thursday and has gotten worst.

## 2019-10-13 NOTE — Discharge Instructions (Addendum)
Start Macrobid 100mg  twice a day for 5 days. Increase water intake- continue to drink Cranberry juice. Follow-up in 2 to 3 days if not improving.

## 2019-10-13 NOTE — ED Provider Notes (Signed)
[facility]    CSN: [id number] Arrival date & time: 10/13/19  1225      History   Chief Complaint Chief Complaint  Patient presents with  . Urinary Tract Infection    HPI Carolyn Mendez is a 43 y.o. female.   43 year old female presents with burning with urination, urinary frequency for the past 3 to 4 days. Symptoms are getting worse and she now is seeing frank blood in her urine along with having lower abdominal pain and back pain. She denies any fever or vaginal symptoms. LMP last week. She has been drinking Cranberry juice with minimal relief. She did have a kidney infection many years ago. Other chronic health issues include GERD and she takes Protonix prn.   The history is provided by the patient.    Past Medical History:  Diagnosis Date  . Fatty liver   . GERD (gastroesophageal reflux disease)   . Gestational diabetes   . 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 Symptomatic cholelithiasis 04/10/2019    Patient Active Problem List   Diagnosis Date Noted  . LUQ pain 05/02/2019  . Fatty liver 04/16/2019  . Language barrier 02/16/2018  . Unwanted fertility 11/27/2017  . History of VBAC 10/30/2017  . AMA (advanced maternal age) multigravida 35+ 10/30/2017  . Irritable bowel disease 02/25/2016  . Depression 09/24/2013  . OBESITY 01/15/2009    Past Surgical History:  Procedure Laterality Date  . CESAREAN SECTION     VBAC x2  . CESAREAN SECTION N/A 03/15/2018   Procedure: CESAREAN SECTION;  Surgeon: Conan Bowensavis, Kelly M, MD;  [facility];  Service: Obstetrics;  Laterality: N/A;  . CHOLECYSTECTOMY N/A 04/11/2019   Procedure: LAPAROSCOPIC CHOLECYSTECTOMY WITH INTRAOPERATIVE CHOLANGIOGRAM;  Surgeon: Axel Filleramirez, Armando, MD;  Location: [facility];  Service: General;  Laterality: N/A;    OB History    Gravida  6   Para  5   Term  5   Preterm  0   AB  1   Living  5     SAB      TAB      Ectopic   1   Multiple  0   Live Births  5            Home Medications    Prior to Admission medications   Medication Sig Start Date End Date Taking? Authorizing Provider  nitrofurantoin, macrocrystal-monohydrate, (MACROBID) 100 MG capsule Take 1 capsule (100 mg total) by mouth 2 (two) times daily for 5 days. 10/13/19 10/18/19  Sudie GrumblingAmyot, Dilyn Smiles Berry, NP  pantoprazole (PROTONIX) 40 MG tablet Take 40 mg by mouth daily.    [provider]    Family History Family History  Problem Relation Age of Onset  . Hyperlipidemia Mother   . Kidney disease Mother   . Hyperlipidemia Sister   . Osteoporosis Sister     Social History Social History   Tobacco Use  . Smoking status: Never Smoker  . Smokeless tobacco: Never Used  Substance Use Topics  . Alcohol use: No  . Drug use: No     Allergies   Patient has no known allergies.   Review of Systems Review of Systems  Constitutional: Negative for activity change, appetite change, chills, fatigue and fever.  Respiratory: Negative for cough, chest tightness, shortness of breath and wheezing.   Cardiovascular: Negative for chest pain and palpitations.  Gastrointestinal: Positive for nausea. Negative for abdominal pain and vomiting.  Genitourinary:  Positive for decreased urine volume, dysuria, flank pain, frequency, hematuria and urgency. Negative for difficulty urinating, genital sores, menstrual problem, pelvic pain, vaginal bleeding, vaginal discharge and vaginal pain.  Musculoskeletal: Positive for back pain. Negative for arthralgias and myalgias.  Skin: Negative for color change, rash and wound.  Allergic/Immunologic: Negative for environmental allergies, food allergies and immunocompromised state.  Neurological: Positive for headaches. Negative for dizziness, tremors, seizures, syncope, weakness, light-headedness and numbness.  Hematological: Negative for adenopathy. Does not bruise/bleed easily.     Physical Exam Triage Vital  Signs ED Triage Vitals  Enc Vitals Group     BP 10/13/19 1257 (!) 99/59     Pulse Rate 10/13/19 1257 78     Resp 10/13/19 1257 16     Temp 10/13/19 1257 98.3 F (36.8 C)     Temp Source 10/13/19 1257 Oral     SpO2 10/13/19 1257 97 %     Weight --      Height --      Head Circumference --      Peak Flow --      Pain Score 10/13/19 1256 9     Pain Loc --      Pain Edu? --      Excl. in [place]? --    No data found.  Updated Vital Signs BP (!) 99/59 (BP Location: Right Arm)   Pulse 78   Temp 98.3 F (36.8 C) (Oral)   Resp 16   LMP 10/01/2019   SpO2 97%   Visual Acuity Right Eye Distance:   Left Eye Distance:   Bilateral Distance:    Right Eye Near:   Left Eye Near:    Bilateral Near:     Physical Exam Vitals signs and nursing note reviewed.  Constitutional:      General: She is awake. She is not in acute distress.    Appearance: She is well-developed and well-groomed. She is not ill-appearing.     Comments: Patient sitting comfortably in exam chair in no acute distress.   HENT:     Head: Normocephalic and atraumatic.  Eyes:     Extraocular Movements: Extraocular movements intact.     Conjunctiva/sclera: Conjunctivae normal.  Neck:     Musculoskeletal: Normal range of motion.  Cardiovascular:     Rate and Rhythm: Normal rate and regular rhythm.     Heart sounds: Normal heart sounds. No murmur.  Pulmonary:     Effort: Pulmonary effort is normal. No respiratory distress.     Breath sounds: Normal breath sounds. No stridor. No wheezing or rhonchi.  Abdominal:     General: Bowel sounds are normal. There is no distension.     Palpations: Abdomen is soft.     Tenderness: There is abdominal tenderness in the suprapubic area. There is right CVA tenderness (mild) and left CVA tenderness (mild). There is no guarding or rebound.  Musculoskeletal: Normal range of motion.  Skin:    General: Skin is warm and dry.     Findings: No rash.  Neurological:     General: No focal  deficit present.     Mental Status: She is alert and oriented to person, place, and time.  Psychiatric:        Mood and Affect: Mood normal.        Behavior: Behavior normal. Behavior is cooperative.        Thought Content: Thought content normal.        Judgment: Judgment normal.  UC Treatments / Results  Labs (all labs ordered are listed, but only abnormal results are displayed) Labs Reviewed  POCT URINALYSIS DIP (DEVICE) - Abnormal; Notable for the following components:      Result Value   Hgb urine dipstick MODERATE (*)    Protein, ur 100 (*)    Leukocytes,Ua LARGE (*)    All other components within normal limits    EKG   Radiology No results found.  Procedures Procedures (including critical care time)  Medications Ordered in UC Medications - No data to display  Initial Impression / Assessment and Plan / UC Course  I have reviewed the triage vital signs and the nursing notes.  Pertinent labs & imaging results that were available during my care of the patient were reviewed by me and considered in my medical decision making (see chart for details).    Reviewed urinalysis results with patient- probable UTI. Did not send urine culture due to financial situation (no health insurance and limited funds). Will start Macrobid 100mg  twice a day as directed. Continue to push fluids/water. Note written for work. Follow-up in 2 to 3 days if not improving.   Final Clinical Impressions(s) / UC Diagnoses   Final diagnoses:  Dysuria  Acute cystitis with hematuria     Discharge Instructions     Start Macrobid 100mg  twice a day for 5 days. Increase water intake- continue to drink Cranberry juice. Follow-up in 2 to 3 days if not improving.     ED Prescriptions    Medication Sig Dispense Auth. Provider   nitrofurantoin, macrocrystal-monohydrate, (MACROBID) 100 MG capsule Take 1 capsule (100 mg total) by mouth 2 (two) times daily for 5 days. 10 capsule Keyion Knack, , NP      PDMP not reviewed this encounter.   , NP 10/13/19 2316

## 2019-11-08 ENCOUNTER — Other Ambulatory Visit: Payer: Self-pay

## 2019-11-08 DIAGNOSIS — Z20822 Contact with and (suspected) exposure to covid-19: Secondary | ICD-10-CM

## 2019-11-10 LAB — NOVEL CORONAVIRUS, NAA: SARS-CoV-2, NAA: NOT DETECTED

## 2019-11-11 ENCOUNTER — Other Ambulatory Visit (INDEPENDENT_AMBULATORY_CARE_PROVIDER_SITE_OTHER): Payer: Self-pay

## 2019-11-11 DIAGNOSIS — R7989 Other specified abnormal findings of blood chemistry: Secondary | ICD-10-CM

## 2019-11-11 DIAGNOSIS — K760 Fatty (change of) liver, not elsewhere classified: Secondary | ICD-10-CM

## 2019-11-11 LAB — HEPATIC FUNCTION PANEL
ALT: 24 U/L (ref 0–35)
AST: 16 U/L (ref 0–37)
Albumin: 4.1 g/dL (ref 3.5–5.2)
Alkaline Phosphatase: 80 U/L (ref 39–117)
Bilirubin, Direct: 0.1 mg/dL (ref 0.0–0.3)
Total Bilirubin: 1 mg/dL (ref 0.2–1.2)
Total Protein: 7 g/dL (ref 6.0–8.3)

## 2019-11-12 NOTE — Progress Notes (Signed)
LFT's normal again  This is good news - please explain in [place] Also tell her to go to www.[employer].org and do the nutrition modules to learn about healthy eating. They are available in Spanish. She should use a low carb diet Another website that is good - not sure about spanish is [employer] - low carb diet help She needs to lose weight and eat healthy and that will help her feel better and be healthier   Am ccing her PCP - I suggest she have LFT's 2x/year there and a CBC 1x/year at least there.  If patient desires I can see her but will need to be next year unless problems

## 2019-12-03 ENCOUNTER — Other Ambulatory Visit: Payer: Self-pay

## 2019-12-03 DIAGNOSIS — Z20822 Contact with and (suspected) exposure to covid-19: Secondary | ICD-10-CM

## 2019-12-05 LAB — NOVEL CORONAVIRUS, NAA: SARS-CoV-2, NAA: DETECTED — AB

## 2019-12-17 ENCOUNTER — Telehealth: Payer: Self-pay | Admitting: Family Medicine

## 2019-12-17 NOTE — Telephone Encounter (Signed)
Spoke with patient during a telemedicine visit for two of her kids, who are also my patients. Patient tested positive for COVID on 12/8, after symptoms initially began on 12/4 with fever. Patient reports she is now doing much better. She previously lost her sense of smell, but it has returned. Has not had a fever again since 12/4.  Patient reporting her manager at [employer] is requiring her to be tested again prior to returning to work. There is no clinical utility in repeating her COVID test at this time, as COVID testing can be persistently positive even after the patient has completed their isolation period and is not considered infectious to others.  Advised I will write letter to this effect and fax it to her employer. Patient appreciative. Fax # is [phone].  Leeanne Rio, MD

## 2021-03-24 LAB — HM PAP SMEAR

## 2021-03-24 LAB — RESULTS CONSOLE HPV: CHL HPV: NEGATIVE

## 2021-08-19 ENCOUNTER — Encounter: Payer: Self-pay | Admitting: Family Medicine

## 2021-08-19 ENCOUNTER — Ambulatory Visit: Payer: 59 | Admitting: Family Medicine

## 2021-08-19 ENCOUNTER — Other Ambulatory Visit: Payer: Self-pay

## 2021-08-19 VITALS — BP 104/52 | HR 71 | Wt 195.6 lb

## 2021-08-19 DIAGNOSIS — Z1231 Encounter for screening mammogram for malignant neoplasm of breast: Secondary | ICD-10-CM

## 2021-08-19 DIAGNOSIS — E669 Obesity, unspecified: Secondary | ICD-10-CM

## 2021-08-19 DIAGNOSIS — Z1159 Encounter for screening for other viral diseases: Secondary | ICD-10-CM | POA: Diagnosis not present

## 2021-08-19 DIAGNOSIS — K760 Fatty (change of) liver, not elsewhere classified: Secondary | ICD-10-CM

## 2021-08-19 DIAGNOSIS — R7303 Prediabetes: Secondary | ICD-10-CM

## 2021-08-19 LAB — POCT GLYCOSYLATED HEMOGLOBIN (HGB A1C): Hemoglobin A1C: 5.9 % — AB (ref 4.0–5.6)

## 2021-08-19 NOTE — Progress Notes (Signed)
  Date of Visit: 08/19/2021   SUBJECTIVE:   HPI:  Carolyn Mendez presents today to reestablish care. I last saw her in the office as a patient in March 2017, but have interacted with her regularly when she has accompanied her children to the office for their appts, as I am their PCP too. Spanish interpreter utilized during today's visit.   Patient reports she has overall been doing well since I last saw her. Interval history:   In March 2019 she delivered her youngest daughter, pregnancy complicated by GDM, delivered via c/s with vertical incision (no longer candidate for VBAC should she get pregnant again).  Admitted April 2020 with cholecystitis, underwent cholecystectomy. Diagnosed with hepatic steatosis during that admission and was followed by GI at one outpatient appointment. Last LFT check was in November 2020 and was normal. She would like LFTs rechecked today. Denies any gastrointestinal symptoms today.  Rest of medical history as per EMR.   OBJECTIVE:   BP (!) 104/52   Pulse 71   Wt 195 lb 9.6 oz (88.7 kg)   SpO2 96%   BMI 34.65 kg/m  Gen: no acute distress, pleasant, cooperative, well appearing HEENT: normocephalic, atraumatic  Heart: regular rate and rhythm, no murmur Lungs: clear to auscultation bilaterally, normal work of breathing  Abdomen: soft, nontender to palpation, no masses or organomegaly Neuro: alert, grossly nonfocal, speech normal Ext: No appreciable lower extremity edema bilaterally   ASSESSMENT/PLAN:   Health maintenance:  -discussed new COVID booster will likely be available soon, gave option for booster today or to wait for omicron-specific booster, patient opted to wait -will request pap smear records from health department (reports she had this done March 2022 and was normal) -hep C screen done today -gave info on how to schedule mammo  Fatty liver Update labs today - lipids, CMET, A1c. Encouraged weight loss If LFTs elevated will need to get back in  with GI  OBESITY Encouraged exercise, healthy eating Check TSH today per patient request  FOLLOW UP: Follow up in 6 mos for routine care  [place] J. Pollie Meyer, MD [facility]

## 2021-08-19 NOTE — Patient Instructions (Signed)
It was great to see you again today!  Checking bloodwork today - kidneys, liver, cholesterol, diabetes, hepatitis C  To schedule your mammogram, call the [facility] at [phone]. They are located at [address].    Follow up with me in 6 months, sooner if needed  Be well, Dr. Pollie Meyer

## 2021-08-20 LAB — CMP14+EGFR
ALT: 73 IU/L — ABNORMAL HIGH (ref 0–32)
AST: 51 IU/L — ABNORMAL HIGH (ref 0–40)
Albumin/Globulin Ratio: 1.6 (ref 1.2–2.2)
Albumin: 4.4 g/dL (ref 3.8–4.8)
Alkaline Phosphatase: 78 IU/L (ref 44–121)
BUN/Creatinine Ratio: 21 (ref 9–23)
BUN: 15 mg/dL (ref 6–24)
Bilirubin Total: 1.3 mg/dL — ABNORMAL HIGH (ref 0.0–1.2)
CO2: 22 mmol/L (ref 20–29)
Calcium: 9.2 mg/dL (ref 8.7–10.2)
Chloride: 105 mmol/L (ref 96–106)
Creatinine, Ser: 0.7 mg/dL (ref 0.57–1.00)
Globulin, Total: 2.7 g/dL (ref 1.5–4.5)
Glucose: 104 mg/dL — ABNORMAL HIGH (ref 65–99)
Potassium: 4.2 mmol/L (ref 3.5–5.2)
Sodium: 139 mmol/L (ref 134–144)
Total Protein: 7.1 g/dL (ref 6.0–8.5)
eGFR: 109 mL/min/{1.73_m2} (ref >59)

## 2021-08-20 LAB — LIPID PANEL
Chol/HDL Ratio: 4.5 ratio — ABNORMAL HIGH (ref 0.0–4.4)
Cholesterol, Total: 148 mg/dL (ref 100–199)
HDL: 33 mg/dL — ABNORMAL LOW (ref >39)
LDL Chol Calc (NIH): 95 mg/dL (ref 0–99)
Triglycerides: 107 mg/dL (ref 0–149)
VLDL Cholesterol Cal: 20 mg/dL (ref 5–40)

## 2021-08-20 LAB — CBC
Hematocrit: 39.2 % (ref 34.0–46.6)
Hemoglobin: 12.8 g/dL (ref 11.1–15.9)
MCH: 27.7 pg (ref 26.6–33.0)
MCHC: 32.7 g/dL (ref 31.5–35.7)
MCV: 85 fL (ref 79–97)
Platelets: 248 10*3/uL (ref 150–450)
RBC: 4.62 x10E6/uL (ref 3.77–5.28)
RDW: 12.9 % (ref 11.7–15.4)
WBC: 5.5 10*3/uL (ref 3.4–10.8)

## 2021-08-20 LAB — HCV INTERPRETATION

## 2021-08-20 LAB — TSH: TSH: 2.03 u[IU]/mL (ref 0.450–4.500)

## 2021-08-20 LAB — HCV AB W REFLEX TO QUANT PCR: HCV Ab: <0.1 s/co ratio (ref 0.0–0.9)

## 2021-08-25 NOTE — Assessment & Plan Note (Signed)
Encouraged exercise, healthy eating Check TSH today per patient request

## 2021-08-25 NOTE — Assessment & Plan Note (Signed)
Update labs today - lipids, CMET, A1c. Encouraged weight loss If LFTs elevated will need to get back in with GI

## 2021-09-13 ENCOUNTER — Other Ambulatory Visit: Payer: Self-pay

## 2021-09-25 ENCOUNTER — Telehealth: Payer: Self-pay | Admitting: Family Medicine

## 2021-09-25 DIAGNOSIS — K760 Fatty (change of) liver, not elsewhere classified: Secondary | ICD-10-CM

## 2021-09-25 NOTE — Telephone Encounter (Signed)
Called patient to discuss results, used Spanish phone interpreter. LFTs mildly bumped up. Gave patient option of referral back to hepatology vs rechecking in the next month or so. She and her two sons have been diagnosed with fatty liver disease and are working hard on eating healthier and exercising. She prefers to recheck labs before referring.  No need for statin, The 10-year ASCVD risk score (Arnett DK, et al., 2019) is: 0.7%.  Appointment scheduled 10/12 in the afternoon for lab visit to have CMET drawn. Patient appreciative  Latrelle Dodrill, MD

## 2021-10-05 ENCOUNTER — Ambulatory Visit
Admission: RE | Admit: 2021-10-05 | Discharge: 2021-10-05 | Disposition: A | Discharge status: Home / Self Care | Payer: 59 | Source: Ambulatory Visit | Attending: Family Medicine | Admitting: Family Medicine

## 2021-10-05 DIAGNOSIS — Z1231 Encounter for screening mammogram for malignant neoplasm of breast: Secondary | ICD-10-CM

## 2021-10-05 IMAGING — MG MM DIGITAL SCREENING BILAT W/ TOMO AND CAD
6 of 10 series · 6 of 30 positions shown · non-contrast
Comparison: None.

CLINICAL DATA: Screening.

EXAM:
DIGITAL SCREENING BILATERAL MAMMOGRAM WITH TOMOSYNTHESIS AND CAD
TECHNIQUE: Bilateral screening digital craniocaudal and mediolateral oblique
mammograms were obtained. Bilateral screening digital breast
tomosynthesis was performed. The images were evaluated with
computer-aided detection.

[R CC synth-2D (1 of 2)]
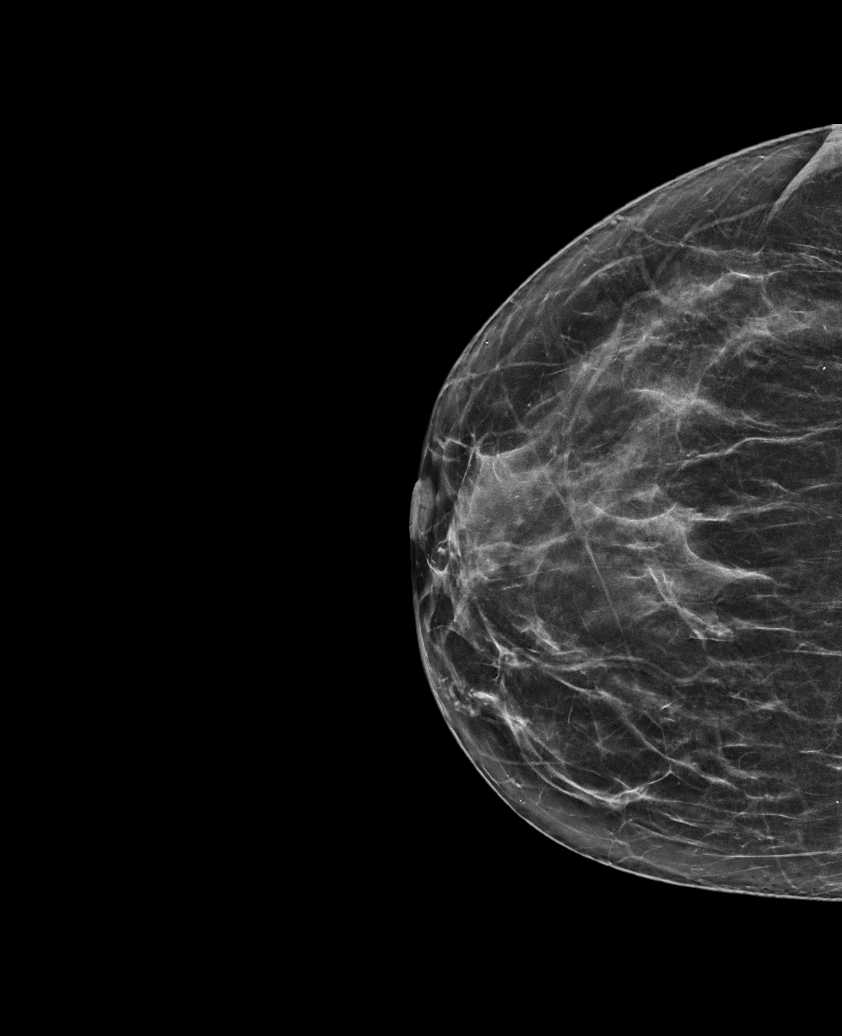

[L MLO synth-2D]
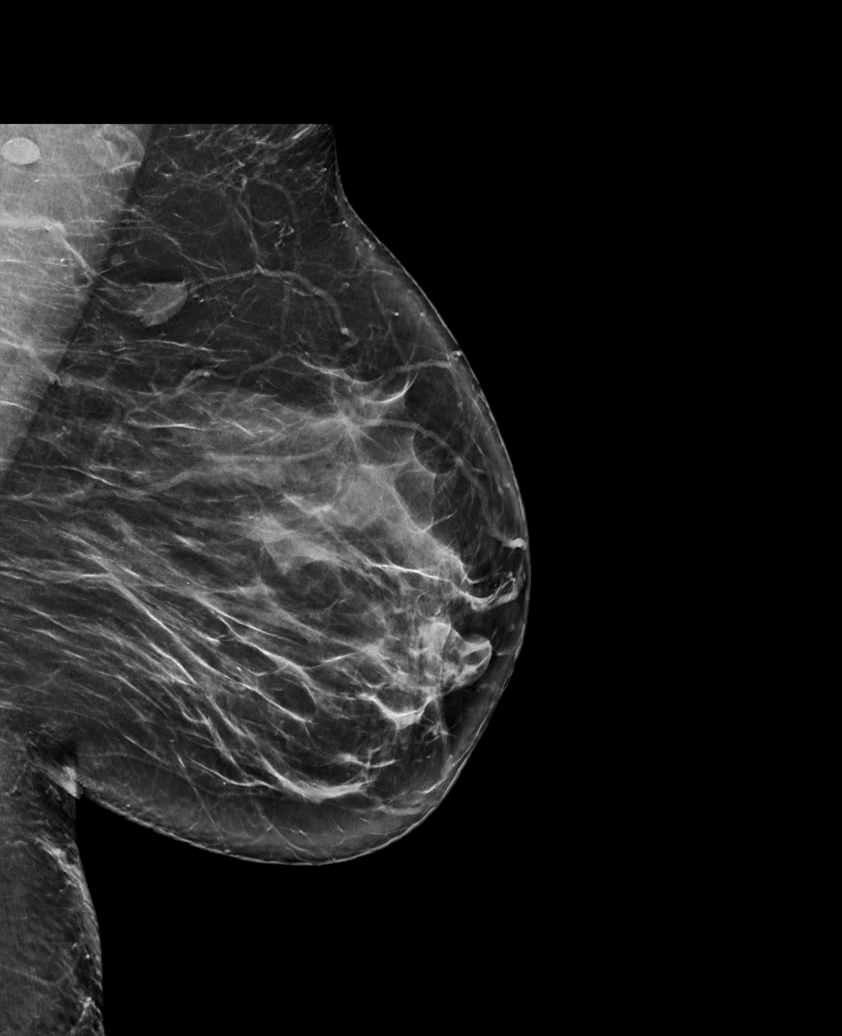

[R CC synth-2D (2 of 2)]
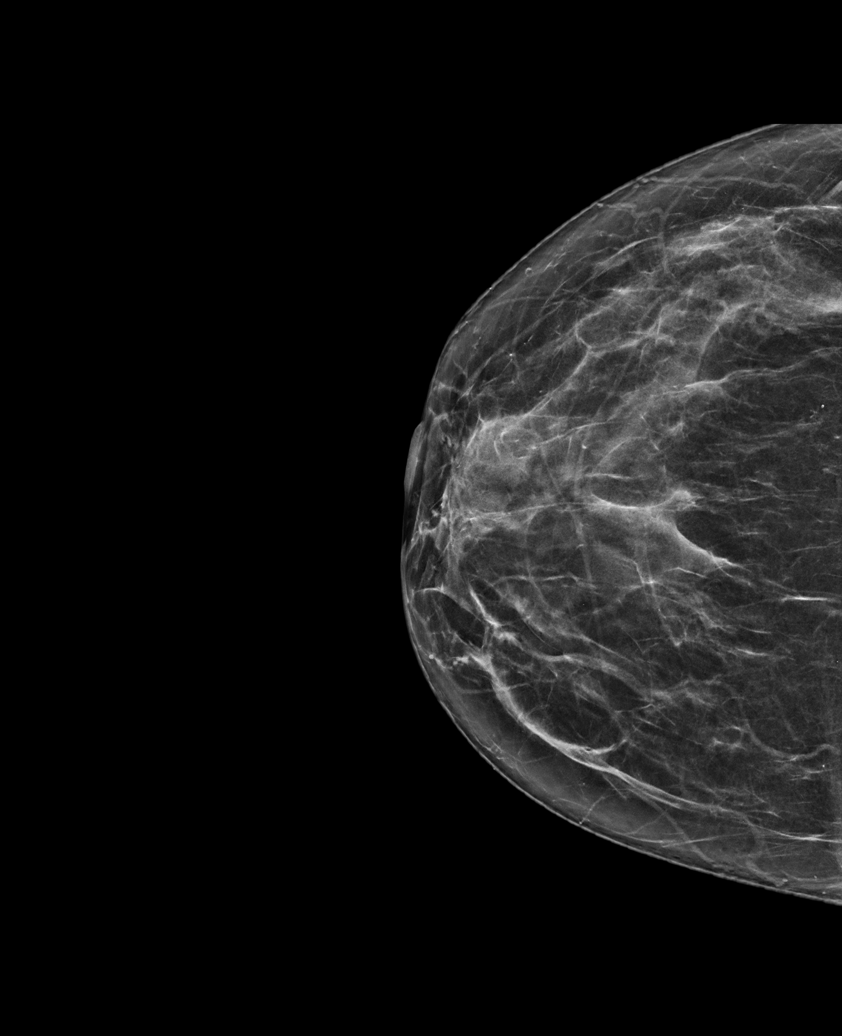

[L CC synth-2D]
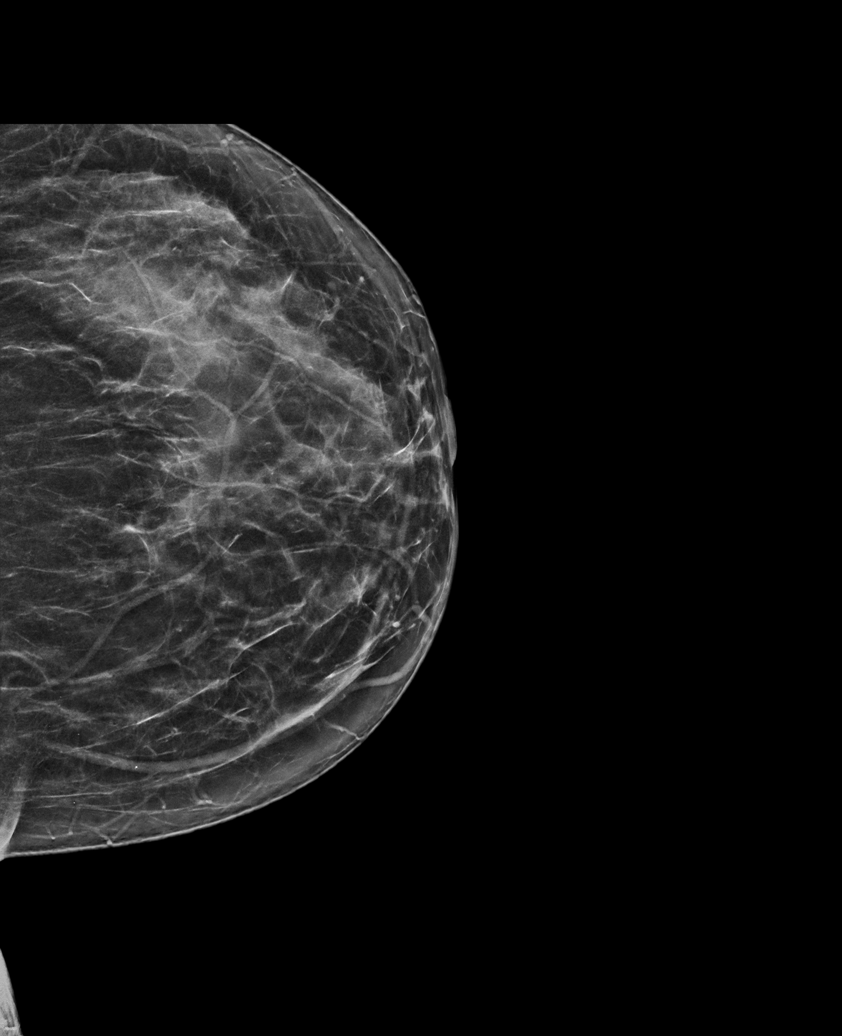

[R MLO synth-2D]
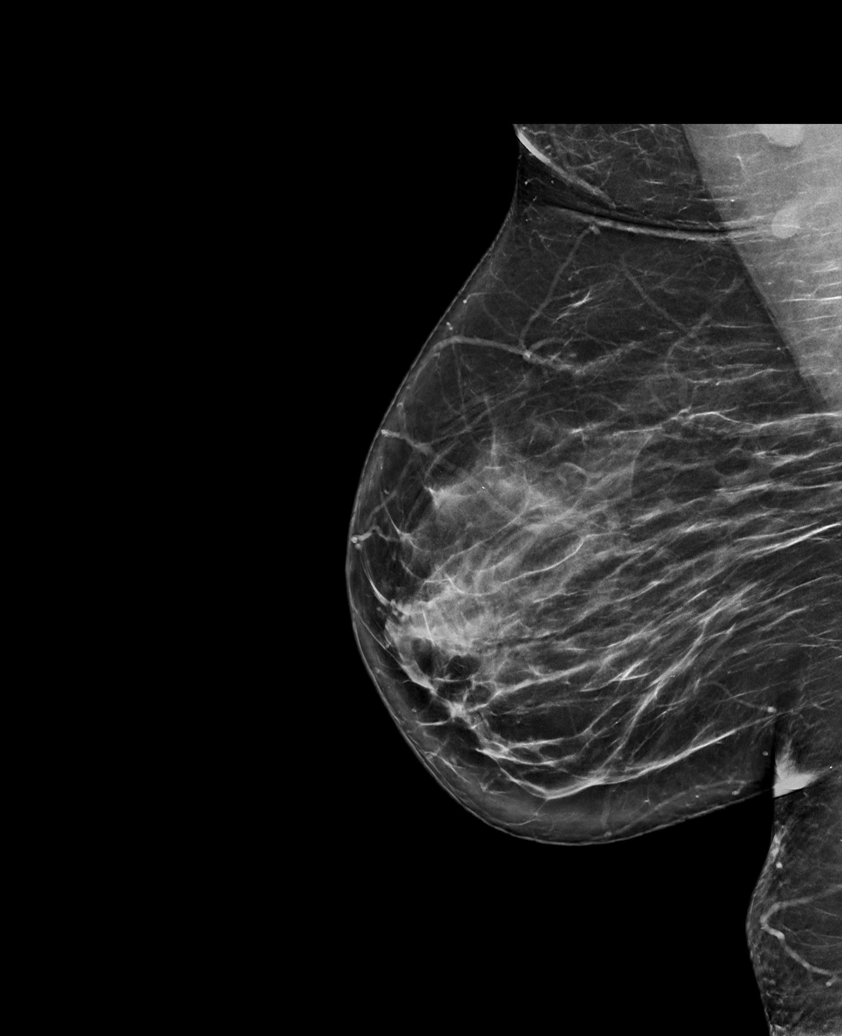

[R CC tomo · tomo slice 33/64.0]
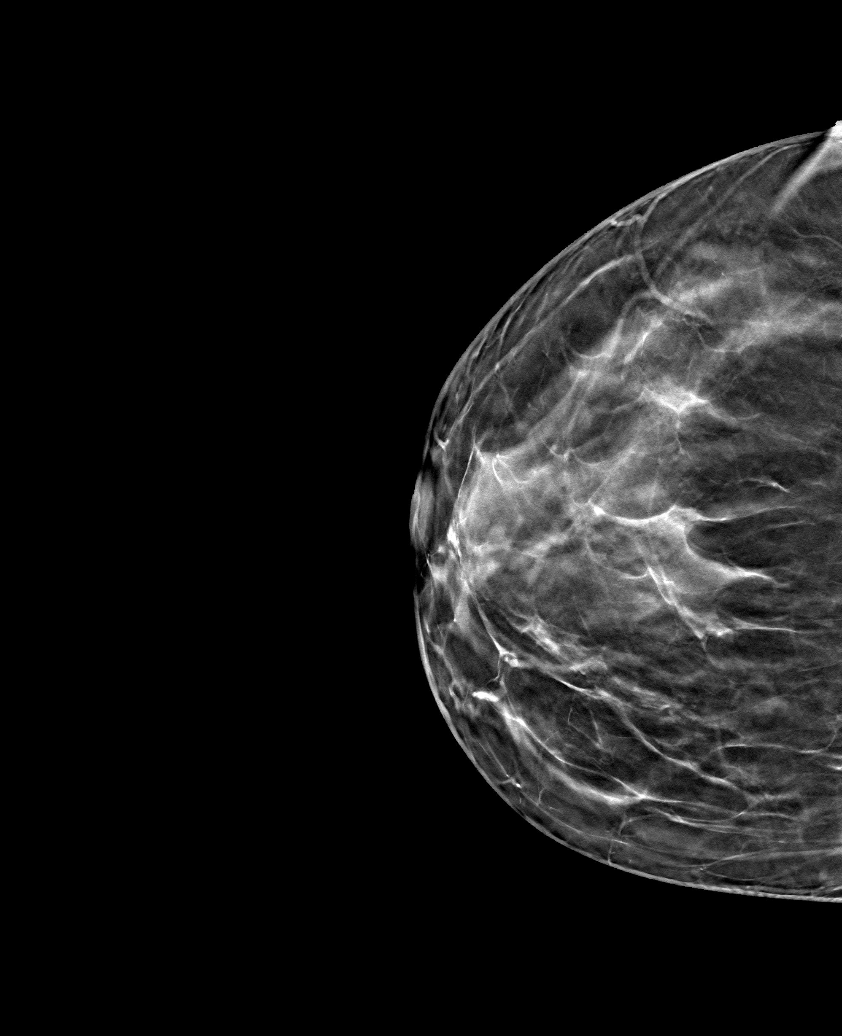

[6 of 30 positions shown; findings below may reference images not displayed]

ACR Breast Density Category c: The breast tissue is heterogeneously
dense, which may obscure small masses
FINDINGS: There are no findings suspicious for malignancy.
IMPRESSION: No mammographic evidence of malignancy. A result letter of this
screening mammogram will be mailed directly to the patient.

RECOMMENDATION:
Screening mammogram in one year. (Code:[6Z])

BI-RADS CATEGORY  1: Negative.

## 2021-10-06 ENCOUNTER — Other Ambulatory Visit: Payer: 59

## 2021-10-06 ENCOUNTER — Other Ambulatory Visit: Payer: Self-pay

## 2021-10-06 DIAGNOSIS — K760 Fatty (change of) liver, not elsewhere classified: Secondary | ICD-10-CM

## 2021-10-07 LAB — CMP14+EGFR
ALT: 50 IU/L — ABNORMAL HIGH (ref 0–32)
AST: 35 IU/L (ref 0–40)
Albumin/Globulin Ratio: 2.4 — ABNORMAL HIGH (ref 1.2–2.2)
Albumin: 4.5 g/dL (ref 3.8–4.8)
Alkaline Phosphatase: 86 IU/L (ref 44–121)
BUN/Creatinine Ratio: 26 — ABNORMAL HIGH (ref 9–23)
BUN: 19 mg/dL (ref 6–24)
Bilirubin Total: 0.9 mg/dL (ref 0.0–1.2)
CO2: 22 mmol/L (ref 20–29)
Calcium: 9.4 mg/dL (ref 8.7–10.2)
Chloride: 102 mmol/L (ref 96–106)
Creatinine, Ser: 0.72 mg/dL (ref 0.57–1.00)
Globulin, Total: 1.9 g/dL (ref 1.5–4.5)
Glucose: 95 mg/dL (ref 70–99)
Potassium: 4.1 mmol/L (ref 3.5–5.2)
Sodium: 137 mmol/L (ref 134–144)
Total Protein: 6.4 g/dL (ref 6.0–8.5)
eGFR: 106 mL/min/{1.73_m2} (ref >59)

## 2022-04-20 ENCOUNTER — Ambulatory Visit (INDEPENDENT_AMBULATORY_CARE_PROVIDER_SITE_OTHER): Payer: 59

## 2022-04-20 ENCOUNTER — Ambulatory Visit (HOSPITAL_COMMUNITY)
Admission: EM | Admit: 2022-04-20 | Discharge: 2022-04-20 | Disposition: A | Discharge status: Home / Self Care | Payer: 59 | Attending: Emergency Medicine | Admitting: Emergency Medicine

## 2022-04-20 ENCOUNTER — Encounter (HOSPITAL_COMMUNITY): Payer: Self-pay | Admitting: Emergency Medicine

## 2022-04-20 DIAGNOSIS — W19XXXA Unspecified fall, initial encounter: Secondary | ICD-10-CM

## 2022-04-20 DIAGNOSIS — M25562 Pain in left knee: Secondary | ICD-10-CM | POA: Diagnosis not present

## 2022-04-20 IMAGING — DX DG KNEE AP/LAT W/ SUNRISE*L*
3 series · 3 of 3 positions shown · non-contrast
Comparison: None.

CLINICAL DATA: Fell onto left knee yesterday. Evaluate for
fracture/effusion.

EXAM:
LEFT KNEE 3 VIEWS

[knee ap]
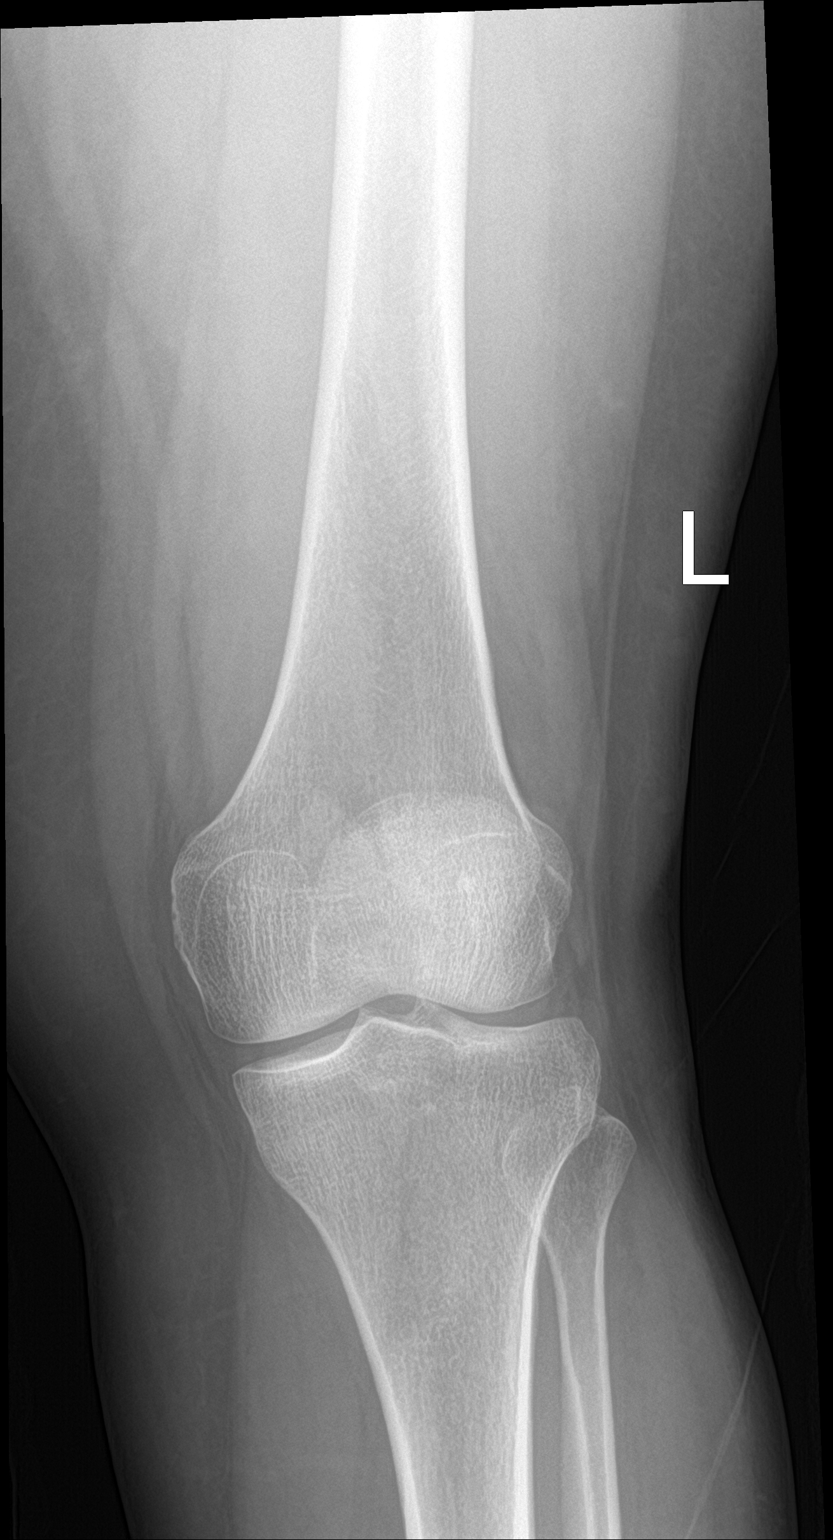

[knee lat]
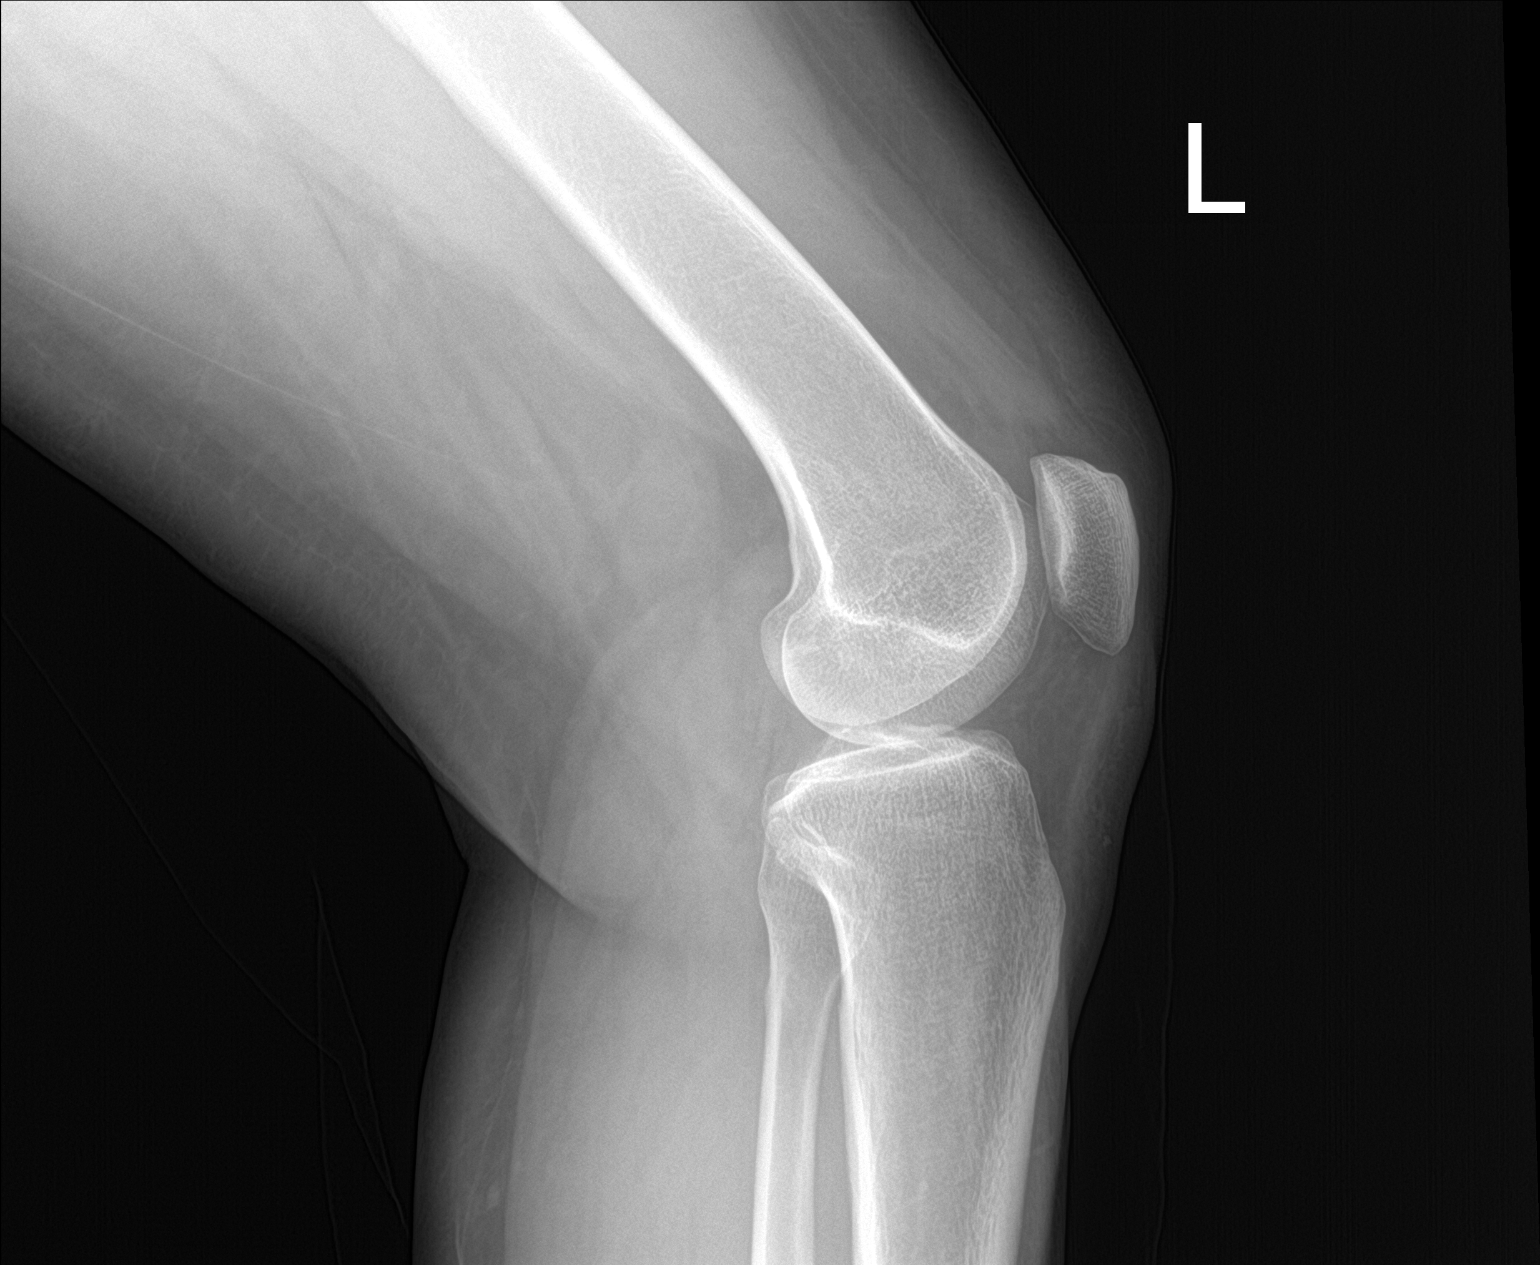

[knee sunrise]
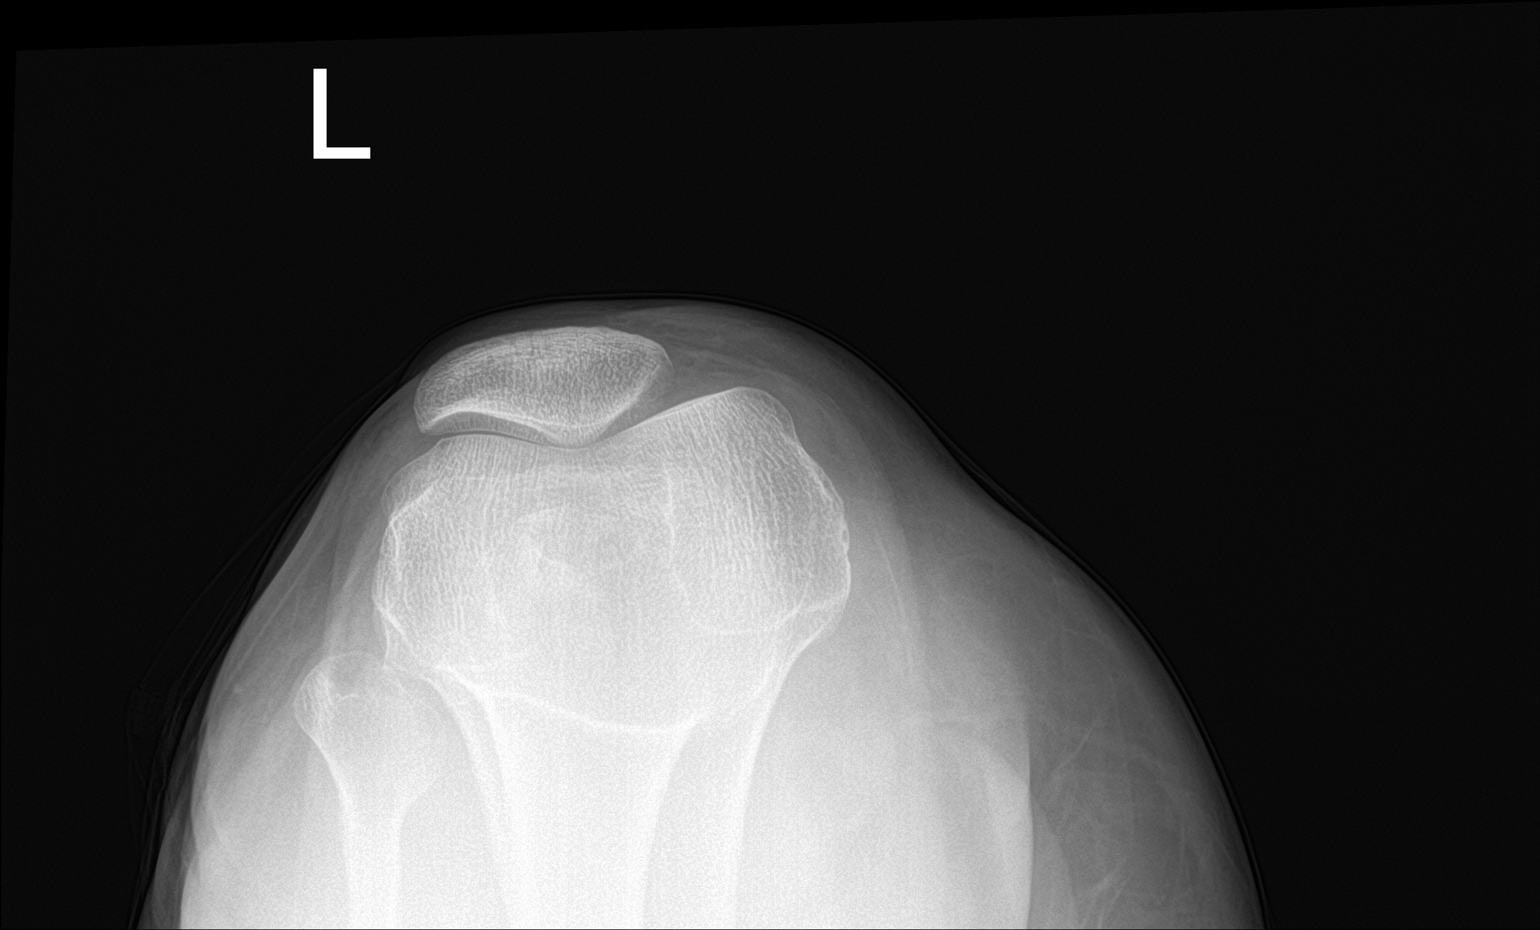

[3 of 3 positions shown; findings below may reference images not displayed]

FINDINGS: Normal bone mineralization. Joint spaces are preserved. No joint
effusion. No acute fracture or dislocation.
IMPRESSION: No acute fracture.  No joint effusion.

## 2022-04-20 MED ORDER — IBUPROFEN 800 MG PO TABS: 800.0000 mg | ORAL_TABLET | Freq: Three times a day (TID) | ORAL | 0 refills | Status: DC

## 2022-04-20 MED ORDER — IBUPROFEN 800 MG PO TABS
800.0000 mg | ORAL_TABLET | Freq: Once | ORAL | Status: AC
  Administered 2022-04-20: 800 mg via ORAL

## 2022-04-20 MED ORDER — IBUPROFEN 800 MG PO TABS
ORAL_TABLET | ORAL | Status: AC
  Filled 2022-04-20: qty 1

## 2022-04-20 NOTE — ED Provider Notes (Signed)
?[facility] ? ? ?MRN: [id number] DOB: 1976/08/14 ? ?Subjective:  ? ?Chief Complaint; Pt c/o falling yesterday and having left knee and leg pains that radiates to left hip and back ?Chief Complaint  ?Patient presents with  ? Leg Pain  ? ? ?Carolyn Mendez is a 46 y.o. female presenting for left lower back and left knee pain status post fall yesterday.  Patient states she did not twist her knee when she fell.  She is ambulatory with a slight limp.  She has taken Tylenol for pain which is not relieved it. ? ?No current facility-administered medications for this encounter. ? ?Current Outpatient Medications:  ?  ibuprofen (ADVIL) 800 MG tablet, Take 1 tablet (800 mg total) by mouth 3 (three) times daily., Disp: 21 tablet, Rfl: 0  ? ?No Known Allergies ? ?Past Medical History:  ?Diagnosis Date  ? Fatty liver   ? GERD (gastroesophageal reflux disease)   ? Gestational diabetes   ? Pregnancy complicated by umbilical cord varix in antepartum period 03/08/2018  ? Symptomatic cholelithiasis 04/10/2019  ?  ? ?Review of Systems  ?All other systems reviewed and are negative. ? ? ?Objective:  ? ?Vitals: ?BP 116/78 (BP Location: Right Arm)   Pulse 71   Temp 98.3 ?F (36.8 ?C) (Oral)   Resp 17   SpO2 97%  ? ?Physical Exam ?Vitals and nursing note reviewed.  ?Constitutional:   ?   General: She is not in acute distress. ?   Appearance: She is well-developed.  ?HENT:  ?   Head: Normocephalic and atraumatic.  ?Eyes:  ?   Conjunctiva/sclera: Conjunctivae normal.  ?Cardiovascular:  ?   Rate and Rhythm: Normal rate and regular rhythm.  ?   Heart sounds: No murmur heard. ?Pulmonary:  ?   Effort: Pulmonary effort is normal. No respiratory distress.  ?   Breath sounds: Normal breath sounds.  ?Abdominal:  ?   Palpations: Abdomen is soft.  ?   Tenderness: There is no abdominal tenderness.  ?Musculoskeletal:     ?   General: No swelling.  ?   Cervical back: Neck supple.  ?   Comments: Left knee without bruising or  deformity-limited range of motion in full flexion due to pain.  Patient can fully extend her knee.  No NVD D. McMurray's positive medial aspect.  Negative drawer.  Positive posterior left knee tenderness without gross effusion noted.  Patient able to bear weight with some pain ? ?Left lower back tenderness to palpation no midline spine tenderness CT or L no evidence of cauda equina  ?Skin: ?   General: Skin is warm and dry.  ?   Capillary Refill: Capillary refill takes less than 2 seconds.  ?   Coloration: Skin is not pale.  ?Neurological:  ?   Mental Status: She is alert.  ?Psychiatric:     ?   Mood and Affect: Mood normal.  ? ? ?No results found for this or any previous visit (from the past 24 hour(s)). ? ?DG Knee AP/LAT W/Sunrise Left ? ?Result Date: 04/20/2022 ?CLINICAL DATA:  Larey Seat onto left knee yesterday. Evaluate for fracture/effusion. EXAM: LEFT KNEE 3 VIEWS COMPARISON:  None. FINDINGS: Normal bone mineralization. Joint spaces are preserved. No joint effusion. No acute fracture or dislocation. IMPRESSION: No acute fracture.  No joint effusion. Electronically Signed   By: Neita Garnet M.D.   On: 04/20/2022 17:23    ?  ? ?Assessment and Plan :  ? ?1. Acute pain of  left knee   ? ? ?Meds ordered this encounter  ?Medications  ? ibuprofen (ADVIL) tablet 800 mg  ? ibuprofen (ADVIL) 800 MG tablet  ?  Sig: Take 1 tablet (800 mg total) by mouth 3 (three) times daily.  ?  Dispense:  21 tablet  ?  Refill:  0  ? ? ?MDM:  ?Carolyn Mendez is a 46 y.o. female presenting for left knee and left lower back pain status post fall yesterday.  Patient has tenderness diffuse to left knee without deformity ecchymosis or gross effusion.  X-ray shows no evidence of fracture or dislocation and no gross effusion.  Patient will be given an Ace wrap and encouraged to limit activity until pain and swelling resolved.  Rx for Motrin is provided she is also encouraged to get Biofreeze or IcyHot for the muscles in her lower back.  I  discussed treatment, follow up and return instructions. Questions were answered. Patient/representative stated understanding of instructions and patient is stable for discharge. ? ?Dewaine Conger FNP-C MCN  ?  ?Jone Baseman, NP ?04/20/22 1726 ? ?

## 2022-04-20 NOTE — Discharge Instructions (Addendum)
The knee x-ray today showed no evidence of fracture or dislocation.  Use Ace wrap for the next couple of days and take Motrin every 6-8 hours along with extra strength Tylenol 2 tablets every 6-8 hours for pain.  Use IcyHot or Biofreeze to the muscles in your left lower back.  Return for reevaluation if not improving over the next several days ?

## 2022-04-20 NOTE — ED Triage Notes (Signed)
Pt c/o falling yesterday and having left knee and leg pains that radiates to left hip and back.  ?

## 2022-11-07 ENCOUNTER — Ambulatory Visit: Payer: 59 | Admitting: Family Medicine

## 2022-11-07 ENCOUNTER — Encounter: Payer: Self-pay | Admitting: Family Medicine

## 2022-11-07 VITALS — BP 100/63 | HR 65 | Temp 98.7°F | Ht 63.0 in | Wt 199.4 lb

## 2022-11-07 DIAGNOSIS — K760 Fatty (change of) liver, not elsewhere classified: Secondary | ICD-10-CM | POA: Diagnosis not present

## 2022-11-07 DIAGNOSIS — Z0000 Encounter for general adult medical examination without abnormal findings: Secondary | ICD-10-CM | POA: Diagnosis not present

## 2022-11-07 DIAGNOSIS — Z23 Encounter for immunization: Secondary | ICD-10-CM | POA: Diagnosis not present

## 2022-11-07 DIAGNOSIS — Z1211 Encounter for screening for malignant neoplasm of colon: Secondary | ICD-10-CM | POA: Diagnosis not present

## 2022-11-07 LAB — POCT GLYCOSYLATED HEMOGLOBIN (HGB A1C): Hemoglobin A1C: 5.8 % — AB (ref 4.0–5.6)

## 2022-11-07 NOTE — Progress Notes (Unsigned)
  Date of Visit: 11/07/2022   SUBJECTIVE:   HPI:  Carolyn Mendez presents today for a well woman exam.   Concerns today: Multiple, see below Periods: Irregular for the last 6 months.  Denies any intermenstrual bleeding.  Cycles vary in length. Contraception: Not currently sexually active Pelvic symptoms: Denies pelvic pain or discharge Sexual activity: Not at present STD Screening: Denies concerns for STIs Pap smear status: Up-to-date on Pap Exercise: Active with her job, otherwise does not exercise Smoking: No Mood: Stressed but coping okay  She would like her labs checked to follow-up on her liver function  Patient identified multiple areas of concern to discuss.  She will schedule a follow-up appointment to discuss th these in more detail since today's visit was a wellness exam.  These issues include: -Headaches occurring 3 days in a row every 2 to 3 months -Pain around her back wrapping around to her front along the flanks -Epigastric pain when she moves her body in certain directions or lifts things -Dizziness -Urinary incontinence  OBJECTIVE:   BP 100/63   Pulse 65   Temp 98.7 F (37.1 C)   Ht 5\' 3"  (1.6 m)   Wt 199 lb 6.4 oz (90.4 kg)   LMP  (LMP Unknown)   SpO2 97%   BMI 35.32 kg/m  Gen: NAD, pleasant, cooperative HEENT: NCAT, PERRL, no palpable thyromegaly or anterior cervical lymphadenopathy Heart: RRR, no murmurs Lungs: CTAB, NWOB Abdomen: soft, nontender to palpation Neuro: grossly nonfocal, speech normal  ASSESSMENT/PLAN:   Health maintenance:  -STD screening: Declines today -pap smear: UTD -mammogram: advised to schedule -lipid screening: Update lipids today -colon cancer screening: Refer for colonoscopy -immunizations:  Flu: Flu shot given today Tdap: Up-to-date COVID booster given today -handout given on health maintenance topics  Fatty liver Update LFTs today with CMET, also A1c  FOLLOW UP: Schedule appointment to discuss multiple other  complaints at patient's convenience.  J. [place], MD [facility]

## 2022-11-07 NOTE — Patient Instructions (Signed)
It was great to see you again today!  Checking labs Flu and COVID shot today To schedule your mammogram, call the [facility] at [phone]. They are located at [address].       Schedule follow up with me in 1 month   Be well, Dr. Pollie Meyer  Mantenimiento de la salud en las mujeres Health Maintenance, Female Adoptar un estilo de vida saludable y recibir atencin preventiva son importantes para promover la salud y Counsellor. Consulte al mdico sobre: El esquema adecuado para hacerse pruebas y exmenes peridicos. Cosas que puede hacer por su cuenta para prevenir enfermedades y Vanderbilt sano. Qu debo saber sobre la dieta, el peso y el ejercicio? Consuma una dieta saludable  Consuma una dieta que incluya muchas verduras, frutas, productos lcteos con bajo contenido de [place] y Associate Professor. No consuma muchos alimentos ricos en grasas slidas, azcares agregados o sodio. Mantenga un peso saludable El ndice de masa muscular [facility]) se [place] para identificar problemas de [place]. Proporciona una estimacin de la grasa corporal basndose en el peso y la altura. Su mdico puede ayudarle a Engineer, site IMC y a Personnel officer o Pharmacologist un peso saludable. Haga ejercicio con regularidad Haga ejercicio con regularidad. Esta es una de las prcticas ms importantes que puede hacer por su salud. La [employer] de los adultos deben seguir estas pautas: Education officer, environmental, al menos, 150 minutos de actividad fsica por semana. El ejercicio debe aumentar la frecuencia cardaca y Media planner transpirar (ejercicio de intensidad moderada). Hacer ejercicios de fortalecimiento por lo [employer] por semana. Agregue esto a su plan de ejercicio de intensidad moderada. Pase menos tiempo sentada. Incluso la actividad fsica ligera puede ser beneficiosa. Controle sus niveles de colesterol y lpidos en la sangre Comience a realizarse anlisis de lpidos y Oncologist en la  sangre a los 20 aos y luego reptalos cada 5 aos. Hgase controlar los niveles de colesterol con mayor frecuencia si: Sus niveles de lpidos y colesterol son altos. Es mayor de 40 aos. Presenta un alto riesgo de padecer enfermedades cardacas. Qu debo saber sobre las pruebas de deteccin del cncer? Segn su historia clnica y sus antecedentes familiares, es posible que deba realizarse pruebas de deteccin del cncer en diferentes edades. Esto puede incluir pruebas de deteccin de lo siguiente: Cncer de mama. Cncer de cuello uterino. Cncer colorrectal. Cncer de piel. Cncer de pulmn. Qu debo saber sobre la enfermedad cardaca, la diabetes y la hipertensin arterial? Presin arterial y enfermedad cardaca La hipertensin arterial causa enfermedades cardacas y [place] el riesgo de accidente cerebrovascular. Es ms probable que esto se manifieste en las personas que tienen lecturas de presin arterial alta o tienen sobrepeso. Hgase controlar la presin arterial: Cada 3 a 5 aos si tiene entre 18 y 33 aos. Todos los aos si es mayor de 40 aos. Diabetes Realcese exmenes de deteccin de la diabetes con regularidad. Este anlisis revisa el nivel de azcar en la sangre en [place]. Hgase las pruebas de deteccin: Cada tres aos despus de los 40 aos de edad si tiene un peso normal y un bajo riesgo de padecer diabetes. Con ms frecuencia y a partir de [place] edad inferior si tiene sobrepeso o un alto riesgo de padecer diabetes. Qu debo saber sobre la prevencin de infecciones? Hepatitis B Si tiene un riesgo ms alto de contraer hepatitis B, debe someterse a un examen de deteccin de este virus. Hable con el mdico para averiguar si tiene riesgo de  contraer la infeccin por hepatitis B. Hepatitis C Se recomienda el anlisis a: [employer] 1944 y 1965. Todas las personas que tengan un riesgo de haber contrado hepatitis C. Enfermedades de transmisin sexual  (ETS) Hgase las pruebas de Airline pilot de ITS, incluidas la gonorrea y la clamidia, si: Es sexualmente activa y es menor de [address]. Es mayor de [address], y Public affairs consultant informa que corre riesgo de tener este tipo de infecciones. La actividad sexual ha cambiado desde que le hicieron la ltima prueba de deteccin y tiene un riesgo mayor de Warehouse manager clamidia o Copy. Pregntele al mdico si usted tiene riesgo. Pregntele al mdico si usted tiene un alto riesgo de Primary school teacher VIH. El mdico tambin puede recomendarle un medicamento recetado para ayudar a evitar la infeccin por el VIH. Si elige tomar medicamentos para prevenir el VIH, primero debe [employer] de deteccin del VIH. Luego debe hacerse anlisis cada 3 meses mientras est tomando los medicamentos. Embarazo Si est por dejar de Armed forces training and education officer (fase premenopusica) y usted puede quedar [place], busque asesoramiento antes de [place]. [place] de 400 a 800 microgramos (mcg) de cido [employer] si [place]. Pida mtodos de control de la natalidad (anticonceptivos) si desea evitar un embarazo no deseado. Osteoporosis y [place] La osteoporosis es una enfermedad en la que los huesos pierden los minerales y la fuerza por el avance de la edad. El resultado pueden ser fracturas en los [place]. Si tiene 65 aos o ms, o si est en riesgo de sufrir osteoporosis y fracturas, pregunte a su mdico si debe: Hacerse pruebas de deteccin de prdida sea. Tomar un suplemento de calcio o de vitamina D para reducir el riesgo de fracturas. Recibir terapia de reemplazo hormonal (TRH) para tratar los sntomas de la menopausia. Siga estas indicaciones en su casa: Consumo de alcohol No beba alcohol si: Su mdico le indica no hacerlo. Est embarazada, puede estar embarazada o est tratando de [place]. Si bebe alcohol: Limite la cantidad que bebe a lo siguiente: De 0 a 1 bebida por da. Sepa cunta cantidad de alcohol hay  en las bebidas que toma. En los [address], una medida equivale a una botella de cerveza de 12 oz (355 ml), un vaso de vino de 5 oz (148 ml) o un vaso de una bebida alcohlica de alta graduacin de 1 oz (44 ml). Estilo de vida No consuma ningn producto que contenga nicotina o tabaco. Estos productos incluyen cigarrillos, tabaco para Theatre manager y aparatos de vapeo, como los Administrator, Civil Service. Si necesita ayuda para dejar de consumir estos productos, consulte al mdico. No consuma drogas. No comparta agujas. Solicite ayuda a su mdico si necesita apoyo o informacin para abandonar las drogas. Indicaciones generales Realcese los estudios de rutina de [address], dentales y de Wellsite geologist. Mantngase al da con las vacunas. Infrmele a su mdico si: Se siente deprimida con frecuencia. Alguna vez ha sido vctima de [place] o no se siente seguro en su casa. Resumen Adoptar un estilo de vida saludable y recibir atencin preventiva son importantes para promover la salud y Counsellor. Siga las instrucciones del mdico acerca de una dieta saludable, el ejercicio y la realizacin de pruebas o exmenes para Hotel manager. Siga las instrucciones del mdico con respecto al control del colesterol y la presin arterial. Esta informacin no tiene Theme park manager el consejo del mdico. Asegrese de hacerle al mdico cualquier pregunta que tenga. Document Revised: 05/20/2021 Document Reviewed: 05/20/2021  Elsevier Patient Education  2023 Elsevier Inc.  

## 2022-11-08 LAB — LIPID PANEL
Chol/HDL Ratio: 4.8 ratio — ABNORMAL HIGH (ref 0.0–4.4)
Cholesterol, Total: 154 mg/dL (ref 100–199)
HDL: 32 mg/dL — ABNORMAL LOW (ref >39)
LDL Chol Calc (NIH): 99 mg/dL (ref 0–99)
Triglycerides: 129 mg/dL (ref 0–149)
VLDL Cholesterol Cal: 23 mg/dL (ref 5–40)

## 2022-11-08 LAB — CMP14+EGFR
ALT: 47 IU/L — ABNORMAL HIGH (ref 0–32)
AST: 41 IU/L — ABNORMAL HIGH (ref 0–40)
Albumin/Globulin Ratio: 1.8 (ref 1.2–2.2)
Albumin: 4.7 g/dL (ref 3.9–4.9)
Alkaline Phosphatase: 93 IU/L (ref 44–121)
BUN/Creatinine Ratio: 21 (ref 9–23)
BUN: 13 mg/dL (ref 6–24)
Bilirubin Total: 1.5 mg/dL — ABNORMAL HIGH (ref 0.0–1.2)
CO2: 23 mmol/L (ref 20–29)
Calcium: 9.5 mg/dL (ref 8.7–10.2)
Chloride: 105 mmol/L (ref 96–106)
Creatinine, Ser: 0.63 mg/dL (ref 0.57–1.00)
Globulin, Total: 2.6 g/dL (ref 1.5–4.5)
Glucose: 97 mg/dL (ref 70–99)
Potassium: 4 mmol/L (ref 3.5–5.2)
Sodium: 140 mmol/L (ref 134–144)
Total Protein: 7.3 g/dL (ref 6.0–8.5)
eGFR: 111 mL/min/{1.73_m2} (ref >59)

## 2022-11-08 NOTE — Assessment & Plan Note (Addendum)
Update LFTs today with CMET, also A1c

## 2022-11-18 ENCOUNTER — Encounter (HOSPITAL_COMMUNITY): Payer: Self-pay | Admitting: Emergency Medicine

## 2022-11-18 ENCOUNTER — Ambulatory Visit (HOSPITAL_COMMUNITY)
Admission: EM | Admit: 2022-11-18 | Discharge: 2022-11-18 | Disposition: A | Discharge status: Home / Self Care | Payer: 59 | Attending: Family Medicine | Admitting: Family Medicine

## 2022-11-18 DIAGNOSIS — N3090 Cystitis, unspecified without hematuria: Secondary | ICD-10-CM | POA: Diagnosis present

## 2022-11-18 LAB — POCT URINALYSIS DIPSTICK, ED / UC
Bilirubin Urine: NEGATIVE
Glucose, UA: NEGATIVE mg/dL
Ketones, ur: NEGATIVE mg/dL
Nitrite: NEGATIVE
Protein, ur: NEGATIVE mg/dL
Specific Gravity, Urine: 1.02 (ref 1.005–1.030)
Urobilinogen, UA: 0.2 mg/dL (ref 0.0–1.0)
pH: 6 (ref 5.0–8.0)

## 2022-11-18 MED ORDER — PHENAZOPYRIDINE HCL 100 MG PO TABS: 100.0000 mg | ORAL_TABLET | Freq: Three times a day (TID) | ORAL | 0 refills | Status: DC | PRN

## 2022-11-18 MED ORDER — CIPROFLOXACIN HCL 500 MG PO TABS: 500.0000 mg | ORAL_TABLET | Freq: Two times a day (BID) | ORAL | 0 refills | Status: AC

## 2022-11-18 NOTE — Discharge Instructions (Addendum)
The urinalysis showed some blood and white blood cells; these are signs that you have a urinary tract infection.  Take Cipro 500 mg--1 tablet 2 times daily for 7 days  Take Pyridium 100 mg--1 tablet 3 times daily as needed for urinary pain.  This medication usually makes the urine orange   Urine culture is sent, and staff will let you know if there is anything that needs to be changed any medicine

## 2022-11-18 NOTE — ED Triage Notes (Signed)
Pt c/o dysuria for about 10 day intermittently but worse in past 4 days. Now having chills.

## 2022-11-18 NOTE — ED Provider Notes (Signed)
[facility]    CSN: [id number] Arrival date & time: 11/18/22  9381      History   Chief Complaint Chief Complaint  Patient presents with   Dysuria    HPI Carolyn Mendez is a 46 y.o. female.    Dysuria  Here for dysuria and urinary frequency.  She has had the symptoms intermittently for the last 10 days, but in the last 4 days she is felt the symptoms more persistently and she is started having chills.  No fever and no nausea or vomiting.  Last menstrual cycle was October 28  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 03/08/2018   Symptomatic cholelithiasis 04/10/2019    Patient Active Problem List   Diagnosis Date Noted   LUQ pain 05/02/2019   Fatty liver 04/16/2019   Language barrier 02/16/2018   Unwanted fertility 11/27/2017   Irritable bowel disease 02/25/2016   Depression 09/24/2013   OBESITY 01/15/2009    Past Surgical History:  Procedure Laterality Date   CESAREAN SECTION     VBAC x2   CESAREAN SECTION N/A 03/15/2018   Procedure: CESAREAN SECTION;  Surgeon: Conan Bowens, MD;  [facility]: [facility];  Service: Obstetrics;  Laterality: N/A;   CHOLECYSTECTOMY N/A 04/11/2019   Procedure: LAPAROSCOPIC CHOLECYSTECTOMY WITH INTRAOPERATIVE CHOLANGIOGRAM;  Surgeon: Axel Filler, MD;  [facility];  Service: General;  Laterality: N/A;    OB History     Gravida  6   Para  5   Term  5   Preterm  0   AB  1   Living  5      SAB      IAB      Ectopic  1   Multiple  0   Live Births  5            Home Medications    Prior to Admission medications   Medication Sig Start Date End Date Taking? Authorizing Provider  ciprofloxacin (CIPRO) 500 MG tablet Take 1 tablet (500 mg total) by mouth 2 (two) times daily for 7 days. 11/18/22 11/25/22 Yes Yamil Dougher, Janace Aris, MD   phenazopyridine (PYRIDIUM) 100 MG tablet Take 1 tablet (100 mg total) by mouth 3 (three) times daily as needed (urinary pain). 11/18/22  Yes Zenia Resides, MD    Family History Family History  Problem Relation Age of Onset   Hyperlipidemia Mother    Kidney disease Mother    Hyperlipidemia Sister    Osteoporosis Sister    Breast cancer Neg Hx     Social History Social History   Tobacco Use   Smoking status: Never   Smokeless tobacco: Never  Substance Use Topics   Alcohol use: No   Drug use: No     Allergies   Patient has no known allergies.   Review of Systems Review of Systems  Genitourinary:  Positive for dysuria.     Physical Exam Triage Vital Signs ED Triage Vitals  Enc Vitals Group     BP 11/18/22 0856 111/73     Pulse Rate 11/18/22 0856 73     Resp 11/18/22 0856 17     Temp 11/18/22 0856 98.9 F (37.2 C)     Temp Source 11/18/22 0856 Oral     SpO2 11/18/22 0856 97 %     Weight --  Height --      Head Circumference --      Peak Flow --      Pain Score 11/18/22 0854 8     Pain Loc --      Pain Edu? --      Excl. in GC? --    No data found.  Updated Vital Signs BP 111/73 (BP Location: Left Arm)   Pulse 73   Temp 98.9 F (37.2 C) (Oral)   Resp 17   LMP 10/21/2022   SpO2 97%   Visual Acuity Right Eye Distance:   Left Eye Distance:   Bilateral Distance:    Right Eye Near:   Left Eye Near:    Bilateral Near:     Physical Exam Vitals reviewed.  Constitutional:      General: She is not in acute distress.    Appearance: She is not toxic-appearing.  HENT:     Mouth/Throat:     Mouth: Mucous membranes are moist.  Eyes:     Extraocular Movements: Extraocular movements intact.     Pupils: Pupils are equal, round, and reactive to light.  Cardiovascular:     Rate and Rhythm: Normal rate and regular rhythm.     Heart sounds: No murmur heard. Pulmonary:     Effort: Pulmonary effort is normal.     Breath sounds: Normal breath  sounds.  Abdominal:     Palpations: Abdomen is soft.     Tenderness: There is abdominal tenderness (suprapubic). There is no right CVA tenderness or left CVA tenderness.  Musculoskeletal:     Cervical back: Neck supple.  Lymphadenopathy:     Cervical: No cervical adenopathy.  Skin:    Coloration: Skin is not jaundiced or pale.  Neurological:     Mental Status: She is oriented to person, place, and time.      UC Treatments / Results  Labs (all labs ordered are listed, but only abnormal results are displayed) Labs Reviewed  POCT URINALYSIS DIPSTICK, ED / UC - Abnormal; Notable for the following components:      Result Value   Hgb urine dipstick MODERATE (*)    Leukocytes,Ua MODERATE (*)    All other components within normal limits    EKG   Radiology No results found.  Procedures Procedures (including critical care time)  Medications Ordered in UC Medications - No data to display  Initial Impression / Assessment and Plan / UC Course  I have reviewed the triage vital signs and the nursing notes.  Pertinent labs & imaging results that were available during my care of the patient were reviewed by me and considered in my medical decision making (see chart for details).        Her urinalysis shows moderate leuks and moderate amount of blood.  Cipro was sent to treat along with some Pyridium for the pain. Final Clinical Impressions(s) / UC Diagnoses   Final diagnoses:  Cystitis     Discharge Instructions      The urinalysis showed some blood and white blood cells; these are signs that you have a urinary tract infection.  Take Cipro 500 mg--1 tablet 2 times daily for 7 days  Take Pyridium 100 mg--1 tablet 3 times daily as needed for urinary pain.  This medication usually makes the urine orange   Urine culture is sent, and staff will let you know if there is anything that needs to be changed any medicine     ED Prescriptions  Medication Sig Dispense  Auth. Provider   ciprofloxacin (CIPRO) 500 MG tablet Take 1 tablet (500 mg total) by mouth 2 (two) times daily for 7 days. 14 tablet Tahji Stonewall, Janace Aris, MD   phenazopyridine (PYRIDIUM) 100 MG tablet Take 1 tablet (100 mg total) by mouth 3 (three) times daily as needed (urinary pain). 10 tablet Marlinda Mike Janace Aris, MD      PDMP not reviewed this encounter.   Zenia Resides, MD 11/18/22 [phone]

## 2022-11-20 LAB — URINE CULTURE: Culture: >=100000 — AB

## 2022-11-23 ENCOUNTER — Other Ambulatory Visit: Payer: Self-pay | Admitting: Family Medicine

## 2022-11-23 DIAGNOSIS — Z1231 Encounter for screening mammogram for malignant neoplasm of breast: Secondary | ICD-10-CM

## 2022-12-12 ENCOUNTER — Encounter: Payer: Self-pay | Admitting: Family Medicine

## 2022-12-12 ENCOUNTER — Ambulatory Visit (INDEPENDENT_AMBULATORY_CARE_PROVIDER_SITE_OTHER): Payer: 59 | Admitting: Family Medicine

## 2022-12-12 VITALS — BP 115/65 | HR 66 | Temp 99.0°F | Wt 198.8 lb

## 2022-12-12 DIAGNOSIS — K760 Fatty (change of) liver, not elsewhere classified: Secondary | ICD-10-CM

## 2022-12-12 NOTE — Progress Notes (Unsigned)
  Date of Visit: 12/12/2022   SUBJECTIVE:   HPI:  Carolyn Mendez presents today for follow up of labs from last visit.  Fatty liver - last visit bili was 1.5, AST 41, ALT 47. Overall she is feeling pretty well. Has not seen GI for fatty liver since May 2020. Has never had serology for viral hepatitis, agreeable to doing this today.   OBJECTIVE:   BP 115/65   Pulse 66   Temp 99 F (37.2 C)   Wt 198 lb 12.8 oz (90.2 kg)   LMP 10/21/2022   SpO2 96%   BMI 35.22 kg/m  Gen: no acute distress, pleasant, cooperative HEENT: normocephalic, atraumatic  Heart: regular rate and rhythm, no murmur Lungs: clear to auscultation bilaterally, normal work of breathing  Neuro: alert, speech normal, grossly nonfocal Abdomen: soft, nontender to palpation, normoactive bowel sounds, no organomegaly or masses  ASSESSMENT/PLAN:   Health maintenance:  -has mammo scheduled -provided phone # for GI office to schedule colonoscopy  Fatty liver Recheck LFTs today. Also check viral hepatitis panel and titers to detect immunity to hepatitis A & B. Anticipate she will need to follow up with GI but will recheck labs today.   [place] J. Pollie Meyer, MD [facility]

## 2022-12-12 NOTE — Patient Instructions (Addendum)
It was great to see you again today!  Call GI office to schedule colonoscopy [facility]: [phone]   Rechecking liver today, also viral hepatitis labs  Be well, Dr. Pollie Meyer

## 2022-12-13 LAB — CMP14+EGFR
ALT: 27 IU/L (ref 0–32)
AST: 20 IU/L (ref 0–40)
Albumin/Globulin Ratio: 1.7 (ref 1.2–2.2)
Albumin: 4.3 g/dL (ref 3.9–4.9)
Alkaline Phosphatase: 82 IU/L (ref 44–121)
BUN/Creatinine Ratio: 17 (ref 9–23)
BUN: 11 mg/dL (ref 6–24)
Bilirubin Total: 1.3 mg/dL — ABNORMAL HIGH (ref 0.0–1.2)
CO2: 20 mmol/L (ref 20–29)
Calcium: 9.4 mg/dL (ref 8.7–10.2)
Chloride: 104 mmol/L (ref 96–106)
Creatinine, Ser: 0.66 mg/dL (ref 0.57–1.00)
Globulin, Total: 2.6 g/dL (ref 1.5–4.5)
Glucose: 101 mg/dL — ABNORMAL HIGH (ref 70–99)
Potassium: 4 mmol/L (ref 3.5–5.2)
Sodium: 138 mmol/L (ref 134–144)
Total Protein: 6.9 g/dL (ref 6.0–8.5)
eGFR: 110 mL/min/{1.73_m2} (ref >59)

## 2022-12-13 LAB — HEPATITIS A ANTIBODY, TOTAL: hep A Total Ab: POSITIVE — AB

## 2022-12-13 LAB — ACUTE HEP PANEL AND HEP B SURFACE AB
Hep A IgM: NEGATIVE
Hep B C IgM: NEGATIVE
Hep C Virus Ab: NONREACTIVE
Hepatitis B Surf Ab Quant: <3.1 m[IU]/mL — ABNORMAL LOW (ref >9.9)
Hepatitis B Surface Ag: NEGATIVE

## 2022-12-13 NOTE — Assessment & Plan Note (Signed)
Recheck LFTs today. Also check viral hepatitis panel and titers to detect immunity to hepatitis A & B. Anticipate she will need to follow up with GI but will recheck labs today.

## 2023-01-18 ENCOUNTER — Ambulatory Visit
Admission: RE | Admit: 2023-01-18 | Discharge: 2023-01-18 | Disposition: A | Discharge status: Home / Self Care | Payer: 59 | Source: Ambulatory Visit | Attending: Family Medicine | Admitting: Family Medicine

## 2023-01-18 DIAGNOSIS — Z1231 Encounter for screening mammogram for malignant neoplasm of breast: Secondary | ICD-10-CM

## 2023-01-20 ENCOUNTER — Other Ambulatory Visit: Payer: Self-pay | Admitting: Family Medicine

## 2023-01-20 DIAGNOSIS — R928 Other abnormal and inconclusive findings on diagnostic imaging of breast: Secondary | ICD-10-CM

## 2023-01-23 ENCOUNTER — Encounter: Payer: Self-pay | Admitting: Student

## 2023-01-23 ENCOUNTER — Ambulatory Visit (INDEPENDENT_AMBULATORY_CARE_PROVIDER_SITE_OTHER): Payer: 59 | Admitting: Student

## 2023-01-23 ENCOUNTER — Encounter: Payer: Self-pay | Admitting: Nurse Practitioner

## 2023-01-23 ENCOUNTER — Other Ambulatory Visit: Payer: Self-pay

## 2023-01-23 VITALS — BP 119/77 | HR 84 | Ht 63.0 in | Wt 198.0 lb

## 2023-01-23 DIAGNOSIS — J029 Acute pharyngitis, unspecified: Secondary | ICD-10-CM | POA: Diagnosis not present

## 2023-01-23 DIAGNOSIS — J101 Influenza due to other identified influenza virus with other respiratory manifestations: Secondary | ICD-10-CM | POA: Diagnosis not present

## 2023-01-23 LAB — POC SOFIA 2 FLU + SARS ANTIGEN FIA
Influenza A, POC: POSITIVE — AB
Influenza B, POC: NEGATIVE
SARS Coronavirus 2 Ag: NEGATIVE

## 2023-01-23 NOTE — Progress Notes (Addendum)
    SUBJECTIVE:   CHIEF COMPLAINT / HPI:   Pharyngitis  Muscle Aches  Malaise On day 10 of symptoms now.  Having some sore throat and congestion, headaches, body aches malaise and chills.  Has been taking Tylenol which helps a little bit.  2 of her children are home sick as well.  They are having similar symptoms.  They took home COVID test which were all negative.   OBJECTIVE:   BP 119/77   Pulse 84   Ht 5\' 3"  (1.6 m)   Wt 198 lb (89.8 kg)   SpO2 99%   BMI 35.07 kg/m   Physical Exam Vitals reviewed.  Constitutional:      General: She is not in acute distress. HENT:     Mouth/Throat:     Mouth: Mucous membranes are moist.     Comments: Oropharynx clear, without erythema or exudate Eyes:     Extraocular Movements: Extraocular movements intact.  Cardiovascular:     Rate and Rhythm: Normal rate and regular rhythm.  Pulmonary:     Effort: Pulmonary effort is normal. No respiratory distress.     Breath sounds: No wheezing or rales.  Abdominal:     General: There is no distension.     Palpations: Abdomen is soft.     Tenderness: There is no abdominal tenderness.  Musculoskeletal:        General: No swelling or deformity.  Lymphadenopathy:     Cervical: No cervical adenopathy.  Skin:    Findings: No rash.  Neurological:     Mental Status: She is alert.      ASSESSMENT/PLAN:   Influenza A As she is on day 10 of illness, she is outside of window for Tamiflu.  I anticipate that her sick symptoms should start to improve over the next few days as she is reaching the end of the expected illness course with influenza.  No respiratory distress or abnormal breath sounds on exam to suggest a superimposed pneumonia at this time.  Supportive care reviewed.     Pearla Dubonnet, MD [place]

## 2023-01-23 NOTE — Patient Instructions (Signed)
You have the flu. You are doing all the right things, Tylenol/Ibuprofen and cough/cold but be careful to not mix Tylenol with Tylenol-containing cold/flu medicines. You should not take more than 4000mg  of Tylenol in a 24 hours period.  If you start having a harder time breathing, come back to see [place].  Be well, Pearla Dubonnet, MD

## 2023-01-23 NOTE — Assessment & Plan Note (Signed)
As she is on day 10 of illness, she is outside of window for Tamiflu.  I anticipate that her sick symptoms should start to improve over the next few days as she is reaching the end of the expected illness course with influenza.  No respiratory distress or abnormal breath sounds on exam to suggest a superimposed pneumonia at this time.  Supportive care reviewed.

## 2023-01-26 ENCOUNTER — Ambulatory Visit: Payer: 59

## 2023-01-26 ENCOUNTER — Ambulatory Visit
Admission: RE | Admit: 2023-01-26 | Discharge: 2023-01-26 | Disposition: A | Discharge status: Home / Self Care | Payer: 59 | Source: Ambulatory Visit | Attending: Family Medicine | Admitting: Family Medicine

## 2023-01-26 DIAGNOSIS — R928 Other abnormal and inconclusive findings on diagnostic imaging of breast: Secondary | ICD-10-CM

## 2023-02-14 NOTE — Progress Notes (Unsigned)
02/14/2023 Carolyn Mendez [facility]:[id number] 1976-08-17   CHIEF COMPLAINT: Discuss scheduling a colonoscopy   HISTORY OF PRESENT ILLNESS: Carolyn Mendez is a 47 year old female with a past medical history of gestational diabetes, fatty liver and GERD. Past C secion x 2 and cholecystectomy. She presents to our office today as referred by Dr. Ilean China to discuss scheduling a screening colonoscopy.      Latest Ref Rng & Units 08/19/2021    9:15 AM 04/13/2019    2:41 AM 04/12/2019   12:41 AM  CBC  WBC 3.4 - 10.8 x10E3/uL 5.5  7.7  7.0   Hemoglobin 11.1 - 15.9 g/dL 12.8  13.3  11.9   Hematocrit 34.0 - 46.6 % 39.2  40.0  35.6   Platelets 150 - 450 x10E3/uL 248  288  260        Latest Ref Rng & Units 12/12/2022   11:34 AM 11/07/2022    3:07 PM 10/06/2021    4:07 PM  CMP  Glucose 70 - 99 mg/dL 101  97  95   BUN 6 - 24 mg/dL 11  13  19   $ Creatinine 0.57 - 1.00 mg/dL 0.66  0.63  0.72   Sodium 134 - 144 mmol/L 138  140  137   Potassium 3.5 - 5.2 mmol/L 4.0  4.0  4.1   Chloride 96 - 106 mmol/L 104  105  102   CO2 20 - 29 mmol/L 20  23  22   $ Calcium 8.7 - 10.2 mg/dL 9.4  9.5  9.4   Total Protein 6.0 - 8.5 g/dL 6.9  7.3  6.4   Total Bilirubin 0.0 - 1.2 mg/dL 1.3  1.5  0.9   Alkaline Phos 44 - 121 IU/L 82  93  86   AST 0 - 40 IU/L 20  41  35   ALT 0 - 32 IU/L 27  7  50     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 03/08/2018   Symptomatic cholelithiasis 04/10/2019   Past Surgical History:  Procedure Laterality Date   CESAREAN SECTION     VBAC x2   CESAREAN SECTION N/A 03/15/2018   Procedure: CESAREAN SECTION;  Surgeon: Sloan Leiter, MD;  Location: [facility];  Service: Obstetrics;  Laterality: N/A;   CHOLECYSTECTOMY N/A 04/11/2019   Procedure: LAPAROSCOPIC CHOLECYSTECTOMY WITH INTRAOPERATIVE CHOLANGIOGRAM;  Surgeon:  Ralene Ok, MD;  Location: [place];  Service: General;  Laterality: N/A;   Social History:  Family History:    reports that she has never smoked. She has never used smokeless tobacco. She reports that she does not drink alcohol and does not use drugs. family history includes Hyperlipidemia in her mother and sister; Kidney disease in her mother; Osteoporosis in her sister.  No Known Allergies    Outpatient Encounter Medications as of 02/15/2023  Medication Sig   phenazopyridine (PYRIDIUM) 100 MG tablet Take 1 tablet (100 mg total) by mouth 3 (three) times daily as needed (urinary pain).   No facility-administered encounter medications on file as of 02/15/2023.     REVIEW OF SYSTEMS:  Gen: Denies fever, sweats or chills. No weight loss.  CV: Denies chest pain, palpitations or edema. Resp: Denies cough, shortness of breath of hemoptysis.  GI: Denies heartburn, dysphagia, stomach or lower abdominal pain. No diarrhea  or constipation.  GU : Denies urinary burning, blood in urine, increased urinary frequency or incontinence. MS: Denies joint pain, muscles aches or weakness. Derm: Denies rash, itchiness, skin lesions or unhealing ulcers. Psych: Denies depression, anxiety, memory loss or confusion. Heme: Denies bruising, easy bleeding. Neuro:  Denies headaches, dizziness or paresthesias. Endo:  Denies any problems with DM, thyroid or adrenal function.  PHYSICAL EXAM: There were no vitals taken for this visit. General: in no acute distress. Head: Normocephalic and atraumatic. Eyes:  Sclerae non-icteric, conjunctive pink. Ears: Normal auditory acuity. Mouth: Dentition intact. No ulcers or lesions.  Neck: Supple, no lymphadenopathy or thyromegaly.  Lungs: Clear bilaterally to auscultation without wheezes, crackles or rhonchi. Heart: Regular rate and rhythm. No murmur, rub or gallop appreciated.  Abdomen: Soft, nontender, nondistended. No masses. No hepatosplenomegaly. Normoactive  bowel sounds x 4 quadrants.  Rectal:  Musculoskeletal: Symmetrical with no gross deformities. Skin: Warm and dry. No rash or lesions on visible extremities. Extremities: No edema. Neurological: Alert oriented x 4, no focal deficits.  Psychological:  Alert and cooperative. Normal mood and affect.  ASSESSMENT AND PLAN:    CC:  Leeanne Rio, MD

## 2023-02-15 ENCOUNTER — Encounter: Payer: Self-pay | Admitting: Nurse Practitioner

## 2023-02-15 ENCOUNTER — Ambulatory Visit: Payer: 59 | Admitting: Nurse Practitioner

## 2023-02-15 ENCOUNTER — Other Ambulatory Visit (INDEPENDENT_AMBULATORY_CARE_PROVIDER_SITE_OTHER): Payer: 59

## 2023-02-15 VITALS — BP 110/74 | HR 76 | Ht 61.75 in | Wt 204.4 lb

## 2023-02-15 DIAGNOSIS — Z1211 Encounter for screening for malignant neoplasm of colon: Secondary | ICD-10-CM

## 2023-02-15 DIAGNOSIS — R1011 Right upper quadrant pain: Secondary | ICD-10-CM

## 2023-02-15 LAB — CBC
HCT: 37.4 % (ref 36.0–46.0)
Hemoglobin: 12.6 g/dL (ref 12.0–15.0)
MCHC: 33.6 g/dL (ref 30.0–36.0)
MCV: 83.3 fl (ref 78.0–100.0)
Platelets: 290 10*3/uL (ref 150.0–400.0)
RBC: 4.49 Mil/uL (ref 3.87–5.11)
RDW: 13.1 % (ref 11.5–15.5)
WBC: 7.2 10*3/uL (ref 4.0–10.5)

## 2023-02-15 MED ORDER — NA SULFATE-K SULFATE-MG SULF 17.5-3.13-1.6 GM/177ML PO SOLN: 1.0000 | Freq: Once | ORAL | 0 refills | Status: AC

## 2023-02-15 MED ORDER — OMEPRAZOLE 40 MG PO CPDR: 40.0000 mg | DELAYED_RELEASE_CAPSULE | Freq: Every day | ORAL | 1 refills | Status: DC

## 2023-02-15 NOTE — Patient Instructions (Addendum)
Le han programado una endoscopia y Ardelia Mems colonoscopia. Siga las instrucciones escritas que se le dieron en su visita de hoy. Recoja sus suministros de preparacin en la farmacia dentro de los prximos 1 a 3 das. Si [place] inhaladores (incluso solo cuando sea necesario), trigalos con usted el da de su procedimiento.  Su proveedor le ha solicitado que vaya al stano para Optometrist anlisis de laboratorio antes de irse hoy. Presione "B" en el ascensor. El laboratorio est ubicado en la primera puerta a la izquierda al salir del Materials engineer.  Due to recent changes in healthcare laws, you may see the results of your imaging and laboratory studies on MyChart before your provider has had a chance to review them.  We understand that in some cases there may be results that are confusing or concerning to you. Not all laboratory results come back in the same time frame and the provider may be waiting for multiple results in order to interpret others.  Please give [place] 48 hours in order for your provider to thoroughly review all the results before contacting the office for clarification of your results.    Thank you for trusting me with your gastrointestinal care!   Carl Best, CRNP

## 2023-02-15 NOTE — Progress Notes (Signed)
Agree with assessment / plan as outlined.  

## 2023-02-16 ENCOUNTER — Telehealth: Payer: Self-pay | Admitting: Nurse Practitioner

## 2023-02-16 LAB — HEPATIC FUNCTION PANEL
ALT: 22 U/L (ref 0–35)
AST: 20 U/L (ref 0–37)
Albumin: 4.3 g/dL (ref 3.5–5.2)
Alkaline Phosphatase: 74 U/L (ref 39–117)
Bilirubin, Direct: 0.2 mg/dL (ref 0.0–0.3)
Total Bilirubin: 1 mg/dL (ref 0.2–1.2)
Total Protein: 7.1 g/dL (ref 6.0–8.3)

## 2023-02-16 NOTE — Telephone Encounter (Signed)
Spoke to pt. Documented in result notes.  Pt verbalized understanding with all questions answered.

## 2023-02-16 NOTE — Telephone Encounter (Signed)
Incoming call from patient states she is returning a call. 

## 2023-04-07 ENCOUNTER — Encounter: Payer: Self-pay | Admitting: Gastroenterology

## 2023-04-07 ENCOUNTER — Ambulatory Visit (AMBULATORY_SURGERY_CENTER): Payer: 59 | Admitting: Gastroenterology

## 2023-04-07 VITALS — BP 110/72 | HR 66 | Temp 98.2°F | Resp 15 | Ht 61.75 in | Wt 204.0 lb

## 2023-04-07 DIAGNOSIS — K2950 Unspecified chronic gastritis without bleeding: Secondary | ICD-10-CM | POA: Diagnosis not present

## 2023-04-07 DIAGNOSIS — B9681 Helicobacter pylori [H. pylori] as the cause of diseases classified elsewhere: Secondary | ICD-10-CM

## 2023-04-07 DIAGNOSIS — D122 Benign neoplasm of ascending colon: Secondary | ICD-10-CM

## 2023-04-07 DIAGNOSIS — Z1211 Encounter for screening for malignant neoplasm of colon: Secondary | ICD-10-CM | POA: Diagnosis present

## 2023-04-07 DIAGNOSIS — K635 Polyp of colon: Secondary | ICD-10-CM

## 2023-04-07 DIAGNOSIS — R1011 Right upper quadrant pain: Secondary | ICD-10-CM

## 2023-04-07 LAB — POCT URINE PREGNANCY: Preg Test, Ur: NEGATIVE

## 2023-04-07 MED ORDER — SODIUM CHLORIDE 0.9 % IV SOLN: 500.0000 mL | INTRAVENOUS | Status: DC

## 2023-04-07 NOTE — Progress Notes (Signed)
Sedate, gd SR, tolerated procedure well, VSS, report to RN 

## 2023-04-07 NOTE — Progress Notes (Signed)
Called to room to assist during endoscopic procedure.  Patient ID and intended procedure confirmed with present staff. Received instructions for my participation in the procedure from the performing physician.  

## 2023-04-07 NOTE — Progress Notes (Signed)
[facility] History and Physical   Primary Care Physician:  Latrelle Dodrill, MD   Reason for Procedure:   RUQ Pain post cholecystectomy, colon cancer screening  Plan:    EGD and colonoscopy     HPI: Carolyn Mendez is a 47 y.o. female  here for EGD and colonoscopy for issues as outlined above.   Last seen 02/15/23 - on omeprazole. First colonoscopy for screening. History of cholecystectomy. Postprandial RUQ pain. Omeprazole has slightly helped but it persists. Pain comes and goes, not reproducible with palpation.  No family history of colon cancer known. Otherwise feels well without any cardiopulmonary symptoms.   I have discussed risks / benefits of anesthesia and endoscopic procedure with Pedro Earls and they wish to proceed with the exams as outlined today.    Past Medical History:  Diagnosis Date   Anxiety    Depression    Fatty liver    GERD (gastroesophageal reflux disease)    Gestational diabetes    Pregnancy complicated by umbilical cord varix in antepartum period 03/08/2018   Symptomatic cholelithiasis 04/10/2019    Past Surgical History:  Procedure Laterality Date   CESAREAN SECTION     VBAC x2   CESAREAN SECTION N/A 03/15/2018   Procedure: CESAREAN SECTION;  Surgeon: Conan Bowens, MD;  Location: [facility]ING [facility]ES;  Service: Obstetrics;  Laterality: N/A;   CHOLECYSTECTOMY N/A 04/11/2019   Procedure: LAPAROSCOPIC CHOLECYSTECTOMY WITH INTRAOPERATIVE CHOLANGIOGRAM;  Surgeon: Axel Filler, MD;  Location: [facility] OR;  Service: General;  Laterality: N/A;    Prior to Admission medications   Medication Sig Start Date End Date Taking? Authorizing Provider  omeprazole (PRILOSEC) 40 MG capsule Take 1 capsule (40 mg total) by mouth daily. 02/15/23  Yes Arnaldo Natal, NP    Current Outpatient Medications  Medication Sig Dispense Refill   omeprazole (PRILOSEC) 40 MG capsule Take 1 capsule (40 mg total) by mouth daily. 30 capsule 1    Current Facility-Administered Medications  Medication Dose Route Frequency Provider Last Rate Last Admin   0.9 %  sodium chloride infusion  500 mL Intravenous Continuous Teddy Pena, Willaim Rayas, MD        Allergies as of 04/07/2023   (No Known Allergies)    Family History  Problem Relation Age of Onset   Hyperlipidemia Mother    Kidney disease Mother    Thyroid disease Mother    Coronary artery disease Mother    Arthritis Father    Heart disease Father    Hyperlipidemia Sister    Osteoporosis Sister    Hyperlipidemia Sister    Diabetes Sister    Diabetes Sister    Liver disease Sister        fatty   Liver disease Sister        fatty   Diabetes Brother    Kidney disease Brother        infections   Uterine cancer Maternal Grandmother    Liver disease Son        fatty   Liver disease Son        fatty   Breast cancer Neg Hx    Colon cancer Neg Hx    Esophageal cancer Neg Hx    Rectal cancer Neg Hx    Stomach cancer Neg Hx     Social History   Socioeconomic History   Marital status: Single    Spouse name: Not on file   Number of children: 5   Years of education: Not on file  Highest education level: Not on file  Occupational History   Occupation: [employer]  Tobacco Use   Smoking status: Never   Smokeless tobacco: Never  Vaping Use   Vaping Use: Never used  Substance and Sexual Activity   Alcohol use: No   Drug use: No   Sexual activity: Not Currently    Birth control/protection: None  Other Topics Concern   Not on file  Social History Narrative   Works at [employer] w/ 5 childern born  2000, 2002, 2005 2011 2019   No alcohol tobacco drugs   Social Determinants of Corporate investment banker Strain: Not on file  Food Insecurity: Not on file  Transportation Needs: Not on file  Physical Activity: Not on file  Stress: Not on file  Social Connections: Not on file  Intimate Partner Violence: Not on file    Review of Systems: All other  review of systems negative except as mentioned in the HPI.  Physical Exam: Vital signs BP 123/73   Pulse 75   Temp 98.2 F (36.8 C)   Ht 5' 1.75" (1.568 m)   Wt 204 lb (92.5 kg)   SpO2 97%   BMI 37.61 kg/m   General:   Alert,  Well-developed, pleasant and cooperative in NAD Lungs:  Clear throughout to auscultation.   Heart:  Regular rate and rhythm Abdomen:  Soft, nontender and nondistended.   Neuro/Psych:  Alert and cooperative. Normal mood and affect. A and O x 3  Harlin Rain, MD [facility]

## 2023-04-07 NOTE — Patient Instructions (Addendum)
Handouts given for hiatal hernia, polyps and hemorrhoids in spanish  USTED TUVO UN PROCEDIMIENTO ENDOSCPICO HOY EN EL [facility]:   Lea el informe del procedimiento que se le entreg para cualquier pregunta especfica sobre lo que se Dentist.  Si el informe del examen no responde a sus preguntas, por favor llame a su gastroenterlogo para aclararlo.  Si usted solicit que no se le den [employer] de lo que se Clinical cytogeneticist en su procedimiento al [employer] va a cuidar, entonces el informe del procedimiento se ha incluido en un sobre sellado para que usted lo revise despus cuando le sea ms conveniente.   LO QUE PUEDE ESPERAR: Algunas sensaciones de hinchazn en el abdomen.  Puede tener ms gases de lo normal.  El caminar puede ayudarle a eliminar el aire que se le puso en el tracto gastrointestinal durante el procedimiento y reducir la hinchazn.  Si le hicieron una endoscopia inferior (como una colonoscopia o una sigmoidoscopia flexible), podra notar manchas de sangre en las heces fecales o en el papel higinico.  Si se someti a una preparacin intestinal para su procedimiento, es posible que no tenga una evacuacin intestinal normal durante [employer].   Tenga en cuenta:  Es posible que note un poco de irritacin y congestin en la nariz o algn drenaje.  Esto es debido al oxgeno [employer] durante su procedimiento.  No hay que preocuparse y esto debe desaparecer ms o Regulatory affairs officer.   SNTOMAS PARA REPORTAR INMEDIATAMENTE:  Despus de una endoscopia inferior (colonoscopia):  Cantidades excesivas de sangre en las heces fecales  Sensibilidad significativa o empeoramiento de los dolores abdominales   Hinchazn aguda del abdomen que antes no tena   Fiebre de 100F o ms  Despus de la endoscopia superior (EGD)  Vmitos de Retail buyer o material como caf molido   Dolor en el pecho o dolor debajo de los omplatos que antes no tena   Dolor o dificultad persistente  para tragar  Falta de aire que antes no tena   Heces fecales negras y pegajosas   Para asuntos urgentes o de Associate Professor, puede comunicarse con un gastroenterlogo a cualquier hora llamando al [phone].  DIETA:  Recomendamos una comida pequea al principio, pero luego puede continuar con su dieta normal.  Tome muchos lquidos, Tax adviser las bebidas alcohlicas durante 24 horas.    ACTIVIDAD:  Debe planear tomarse las cosas con calma por el resto del da y no debe CONDUCIR ni usar maquinaria pesada Patent examiner (debido a los medicamentos de sedacin utilizados durante el examen).     SEGUIMIENTO: Nuestro personal llamar al nmero que aparece en su historial al siguiente da hbil de su procedimiento para ver cmo se siente y para responder cualquier pregunta o inquietud que pueda tener con respecto a la informacin que se le dio despus del procedimiento. Si no podemos contactarle, le dejaremos un mensaje.  Sin embargo, si se siente bien y no tiene English as a second language teacher, no es necesario que nos devuelva la llamada.  Asumiremos que ha regresado a sus actividades diarias normales sin incidentes. Si se le tomaron algunas biopsias, le contactaremos por telfono o por carta en las prximas 3 semanas.  Si no ha sabido [employer] biopsias en el transcurso de 3 semanas, por favor llmenos al [phone].   FIRMAS/CONFIDENCIALIDAD: Usted y/o el acompaante que le cuide han firmado documentos que se ingresarn en su historial mdico Forensic scientist.  Estas firmas atestiguan el  hecho de que la informacin anterior

## 2023-04-07 NOTE — Op Note (Signed)
[name] Patient Name: Carolyn Mendez Procedure Date: 04/07/2023 2:43 PM MRN: [id number] Endoscopist: Viviann Spare P. Adela Lank , MD, [id number] Age: 47 Referring MD:  Date of Birth: [birth year] Gender: Female Account #: [id number] Procedure:                Upper GI endoscopy Indications:              Abdominal pain in the right upper quadrant- history                            of cholecystectomy. Can be posprandial, trial of                            omeprazole  has helped slightly but symptoms                            largely persist, intermittent. Not reproducible on                            exam Medicines:                Monitored Anesthesia Care Procedure:                Pre-Anesthesia Assessment:                           - Prior to the procedure, a History and Physical                            was performed, and patient medications and                            allergies were reviewed. The patient's tolerance of                            previous anesthesia was also reviewed. The risks                            and benefits of the procedure and the sedation                            options and risks were discussed with the patient.                            All questions were answered, and informed consent                            was obtained. Prior Anticoagulants: The patient has                            taken no anticoagulant or antiplatelet agents. ASA                            Grade Assessment: II - A patient with mild systemic  disease. After reviewing the risks and benefits,                            the patient was deemed in satisfactory condition to                            undergo the procedure.                           After obtaining informed consent, the endoscope was                            passed under direct vision. Throughout the                            procedure, the patient's blood pressure,  pulse, and                            oxygen saturations were monitored continuously. The                            Olympus Scope [id number] was introduced through the                            mouth, and advanced to the second part of duodenum.                            The upper GI endoscopy was accomplished without                            difficulty. The patient tolerated the procedure                            well. Scope In: Scope Out: Findings:                 Esophagogastric landmarks were identified: the                            Z-line was found at 39 cm, the gastroesophageal                            junction was found at 39 cm and the upper extent of                            the gastric folds was found at 40 cm from the                            incisors.                           A 1 cm hiatal hernia was present.                           The exam of the esophagus was  otherwise normal.                           The entire examined stomach was normal. Biopsies                            were taken with a cold forceps for Helicobacter                            pylori testing.                           The examined duodenum was normal. Complications:            No immediate complications. Estimated blood loss:                            Minimal. Estimated Blood Loss:     Estimated blood loss was minimal. Impression:               - Esophagogastric landmarks identified.                           - 1 cm hiatal hernia.                           - Normal esophagus otherwise.                           - Normal stomach. Biopsied.                           - Normal examined duodenum.                           No clear cause for symptoms on this exam. Will                            await biopsies for H pylori. Symptoms could be                            musculoskeletal / abdominal wall pain post                            cholecystectomy however a bit atypical given                             prandial association. Consider cross sectional                            imaging if symptoms persist. Recommendation:           - Patient has a contact number available for                            emergencies. The signs and symptoms of potential  delayed complications were discussed with the                            patient. Return to normal activities tomorrow.                            Written discharge instructions were provided to the                            patient.                           - Resume previous diet.                           - Continue present medications.                           - Await pathology results. Viviann Spare P. Levert Heslop, MD 04/07/2023 3:24:54 PM This report has been signed electronically.

## 2023-04-07 NOTE — Op Note (Signed)
[facility] Patient Name: Carolyn Mendez Procedure Date: 04/07/2023 2:36 PM MRN: [id number] Endoscopist: Carolyn Mendez , MD, [id number] Age: 47 Referring MD:  Date of Birth: 10/16/1976 Gender: Female Account #: [id number] Procedure:                Colonoscopy Indications:              Screening for colorectal malignant neoplasm, This                            is the patient's first colonoscopy Medicines:                Monitored Anesthesia Care Procedure:                Pre-Anesthesia Assessment:                           - Prior to the procedure, a History and Physical                            was performed, and patient medications and                            allergies were reviewed. The patient's tolerance of                            previous anesthesia was also reviewed. The risks                            and benefits of the procedure and the sedation                            options and risks were discussed with the patient.                            All questions were answered, and informed consent                            was obtained. Prior Anticoagulants: The patient has                            taken no anticoagulant or antiplatelet agents. ASA                            Grade Assessment: II - A patient with mild systemic                            disease. After reviewing the risks and benefits,                            the patient was deemed in satisfactory condition to                            undergo the procedure.  After obtaining informed consent, the colonoscope                            was passed under direct vision. Throughout the                            procedure, the patient's blood pressure, pulse, and                            oxygen saturations were monitored continuously. The                            Olympus [id number]-[id number]) Colonoscope was                            introduced  through the anus and advanced to the the                            cecum, identified by appendiceal orifice and                            ileocecal valve. The colonoscopy was performed                            without difficulty. The patient tolerated the                            procedure well. The quality of the bowel                            preparation was good. The ileocecal valve,                            appendiceal orifice, and rectum were photographed. Scope In: 2:58:57 PM Scope Out: 3:16:38 PM Scope Withdrawal Time: 0 hours 14 minutes 46 seconds  Total Procedure Duration: 0 hours 17 minutes 41 seconds  Findings:                 The perianal and digital rectal examinations were                            normal.                           A 6 mm polyp was found in the ascending colon. The                            polyp was flat. The polyp was removed with a cold                            snare. Resection and retrieval were complete.                           Internal hemorrhoids were found during  retroflexion. The hemorrhoids were small.                           The exam was otherwise without abnormality. Complications:            No immediate complications. Estimated blood loss:                            Minimal. Estimated Blood Loss:     Estimated blood loss was minimal. Impression:               - One 6 mm polyp in the ascending colon, removed                            with a cold snare. Resected and retrieved.                           - Internal hemorrhoids.                           - The examination was otherwise normal.                           - The GI Genius (intelligent endoscopy module),                            computer-aided polyp detection system powered by AI                            was utilized to detect colorectal polyps through                            enhanced visualization during colonoscopy. Recommendation:            - Patient has a contact number available for                            emergencies. The signs and symptoms of potential                            delayed complications were discussed with the                            patient. Return to normal activities tomorrow.                            Written discharge instructions were provided to the                            patient.                           - Resume previous diet.                           - Continue present medications.                           -  Await pathology results. Carolyn Spare P. Leniyah Martell, MD 04/07/2023 3:20:51 PM This report has been signed electronically.

## 2023-04-07 NOTE — Progress Notes (Signed)
Pt's states no medical or surgical changes since previsit or office visit. 

## 2023-04-10 ENCOUNTER — Telehealth: Payer: Self-pay | Admitting: *Deleted

## 2023-04-10 NOTE — Telephone Encounter (Signed)
No answer for post procedure call back. Left VM. 

## 2023-04-20 ENCOUNTER — Other Ambulatory Visit: Payer: Self-pay

## 2023-04-20 MED ORDER — CLARITHROMYCIN 500 MG PO TABS: 500.0000 mg | ORAL_TABLET | Freq: Two times a day (BID) | ORAL | 0 refills | Status: AC

## 2023-04-20 MED ORDER — METRONIDAZOLE 500 MG PO TABS: 500.0000 mg | ORAL_TABLET | Freq: Two times a day (BID) | ORAL | 0 refills | Status: AC

## 2023-04-20 MED ORDER — AMOXICILLIN 500 MG PO TABS: 1000.0000 mg | ORAL_TABLET | Freq: Two times a day (BID) | ORAL | 0 refills | Status: AC

## 2023-04-20 MED ORDER — OMEPRAZOLE 40 MG PO CPDR: 40.0000 mg | DELAYED_RELEASE_CAPSULE | Freq: Two times a day (BID) | ORAL | 0 refills | Status: DC

## 2023-06-05 ENCOUNTER — Telehealth: Payer: Self-pay

## 2023-06-05 DIAGNOSIS — A048 Other specified bacterial intestinal infections: Secondary | ICD-10-CM

## 2023-06-05 NOTE — Telephone Encounter (Signed)
-----   Message from Missy Sabins, RN sent at 04/20/2023 12:22 PM EDT ----- Regarding: Diatherix H. Pylori stool test Diatherix H. Pylori stool test - need to enter order

## 2023-06-05 NOTE — Telephone Encounter (Signed)
Called and left patient a detailed lab reminder in spanish. I advised pt to stop by the office this week to pick up Diatherix stool kit from the 2nd floor. I also informed pt that if kit is not picked up this week we will put it back. I advised pt of office hours. Pt has been advised that instructions will be included in kit. I advised pt to call back if she has any questions.  H. Pylori Diatherix test order completed. Kit placed at 2nd floor receptionist desk with instructions.

## 2023-06-09 ENCOUNTER — Telehealth: Payer: Self-pay | Admitting: Gastroenterology

## 2023-06-09 NOTE — Telephone Encounter (Signed)
H pylori eradication testing is NEGATIVE (diatherix sawb 06/08/23).  Jan can you let her know? Thanks

## 2023-06-12 NOTE — Telephone Encounter (Signed)
error 

## 2023-06-12 NOTE — Telephone Encounter (Signed)
Called and spoke with patient in spanish regarding NEGATIVE result. Pt advised that no further treatment is necessary. Pt can follow up PRN. Pt verbalized understanding and had no concerns at the end of the call.

## 2024-01-10 ENCOUNTER — Other Ambulatory Visit: Payer: Self-pay | Admitting: Family Medicine

## 2024-01-10 DIAGNOSIS — Z1231 Encounter for screening mammogram for malignant neoplasm of breast: Secondary | ICD-10-CM

## 2024-01-29 ENCOUNTER — Ambulatory Visit
Admission: RE | Admit: 2024-01-29 | Discharge: 2024-01-29 | Disposition: A | Discharge status: Home / Self Care | Payer: 59 | Source: Ambulatory Visit | Attending: Family Medicine | Admitting: Family Medicine

## 2024-01-29 DIAGNOSIS — Z1231 Encounter for screening mammogram for malignant neoplasm of breast: Secondary | ICD-10-CM

## 2024-01-31 ENCOUNTER — Encounter: Payer: 59 | Admitting: Family Medicine

## 2024-02-01 ENCOUNTER — Encounter: Payer: Self-pay | Admitting: Family Medicine

## 2024-02-01 ENCOUNTER — Other Ambulatory Visit (HOSPITAL_COMMUNITY)
Admission: RE | Admit: 2024-02-01 | Discharge: 2024-02-01 | Disposition: A | Discharge status: Home / Self Care | Payer: 59 | Source: Ambulatory Visit | Attending: Family Medicine | Admitting: Family Medicine

## 2024-02-01 ENCOUNTER — Ambulatory Visit: Payer: 59 | Admitting: Family Medicine

## 2024-02-01 VITALS — BP 127/73 | HR 72 | Ht 61.0 in | Wt 198.7 lb

## 2024-02-01 DIAGNOSIS — J069 Acute upper respiratory infection, unspecified: Secondary | ICD-10-CM | POA: Diagnosis not present

## 2024-02-01 DIAGNOSIS — K760 Fatty (change of) liver, not elsewhere classified: Secondary | ICD-10-CM | POA: Diagnosis not present

## 2024-02-01 DIAGNOSIS — R300 Dysuria: Secondary | ICD-10-CM

## 2024-02-01 DIAGNOSIS — N393 Stress incontinence (female) (male): Secondary | ICD-10-CM

## 2024-02-01 DIAGNOSIS — Z0000 Encounter for general adult medical examination without abnormal findings: Secondary | ICD-10-CM | POA: Diagnosis not present

## 2024-02-01 DIAGNOSIS — Z23 Encounter for immunization: Secondary | ICD-10-CM | POA: Diagnosis not present

## 2024-02-01 LAB — POC SOFIA 2 FLU + SARS ANTIGEN FIA
Influenza A, POC: NEGATIVE
Influenza B, POC: NEGATIVE
SARS Coronavirus 2 Ag: NEGATIVE

## 2024-02-01 LAB — POCT URINALYSIS DIP (MANUAL ENTRY)
Bilirubin, UA: NEGATIVE
Glucose, UA: NEGATIVE mg/dL
Ketones, POC UA: NEGATIVE mg/dL
Leukocytes, UA: NEGATIVE
Nitrite, UA: NEGATIVE
Protein Ur, POC: NEGATIVE mg/dL
Spec Grav, UA: 1.025 (ref 1.010–1.025)
Urobilinogen, UA: 0.2 U/dL
pH, UA: 6 (ref 5.0–8.0)

## 2024-02-01 NOTE — Assessment & Plan Note (Signed)
 Update CMET and lipids today

## 2024-02-01 NOTE — Patient Instructions (Addendum)
 It was great to see you again today.  See handout on kegel exercises Checking for infections Flu and COVID vaccines today  Schedule appointment for physical  Be well, Dr. Dawn Eth

## 2024-02-01 NOTE — Progress Notes (Signed)
  Date of Visit: 02/01/2024   SUBJECTIVE:   HPI:  Carolyn Mendez presents to discuss several issues.  She was originally scheduled for a physical, however we elected to defer that to another time due to her having so many issues to discuss.  Incontinence: Reports having incontinence associated with dysuria both before and after her period monthly for the last 6 to 8 months.  Incontinence mostly occurs when she sneezes or coughs.  She has had 3 prior vaginal deliveries and 2 C-sections.  Denies any fevers associated with the symptoms.  Desires STI swabs today when offered.  Sore throat: Began having a sore throat yesterday.  Also has headache, ears feel clogged, and nasal congestion.  No cough.  No fever.  Has been around her children who have been sick over the last 6 weeks.  Has been eating and drinking normally.  Rash: Endorses having occasional bumps on her skin.  She does not have any bumps now.  When the bumps have occurred they are itchy.  She does not have a picture of the rash with her.   OBJECTIVE:   BP 127/73   Pulse 72   Ht 5' 1 (1.549 m)   Wt 198 lb 11.2 oz (90.1 kg)   LMP 01/21/2024   SpO2 98%   BMI 37.54 kg/m  Gen: NAD, pleasant, cooperative HEENT: NCAT, TMs clear bilaterally, oropharynx mildly erythematous without exudate, moist mucous membranes, no anterior cervical lymphadenopathy Heart: RRR, no murmurs Lungs: CTAB, NWOB Neuro: grossly nonfocal, speech normal GU: normal appearing external genitalia without lesions. Vagina is moist without abnormal discharge. Cervix normal in appearance. No cervical motion tenderness or tenderness on bimanual exam. No adnexal masses.  No prolapse of pelvic organs identified.  Good strength of pelvic floor when asked to activate those muscles.  ASSESSMENT/PLAN:   Assessment & Plan Upper respiratory tract infection, unspecified type Checked COVID and flu swabs, both negative Supportive care for URI Fatty liver Update CMET and lipids  today Routine adult health maintenance Return for physical another time Recently had mammogram, await read Given COVID and flu vaccines today as her tests were negative and she has been afebrile Stress incontinence Symptoms consistent with stress incontinence.  No major organ prolapse on pelvic exam today. Urinalysis done today and shows small blood, unable to obtain urine microscopy here in the clinic today Will send out UA and micro along with urine culture.  Also obtain GC chlamydia, trichomoniasis, and Candida swabs in case those are contributing to her dysuria. Given handout on Kegels, consider referral to pelvic floor PT in the future if needed  FOLLOW UP: Reschedule physical  Abri Vacca J. Donah, MD [facility]

## 2024-02-01 NOTE — Assessment & Plan Note (Signed)
 Return for physical another time Recently had mammogram, await read Given COVID and flu vaccines today as her tests were negative and she has been afebrile

## 2024-02-01 NOTE — Assessment & Plan Note (Signed)
 Symptoms consistent with stress incontinence.  No major organ prolapse on pelvic exam today. Urinalysis done today and shows small blood, unable to obtain urine microscopy here in the clinic today Will send out UA and micro along with urine culture.  Also obtain GC chlamydia, trichomoniasis, and Candida swabs in case those are contributing to her dysuria. Given handout on Kegels, consider referral to pelvic floor PT in the future if needed

## 2024-02-02 LAB — CMP14+EGFR
ALT: 54 [IU]/L — ABNORMAL HIGH (ref 0–32)
AST: 41 [IU]/L — ABNORMAL HIGH (ref 0–40)
Albumin: 4.4 g/dL (ref 3.9–4.9)
Alkaline Phosphatase: 97 [IU]/L (ref 44–121)
BUN/Creatinine Ratio: 19 (ref 9–23)
BUN: 12 mg/dL (ref 6–24)
Bilirubin Total: 1.2 mg/dL (ref 0.0–1.2)
CO2: 23 mmol/L (ref 20–29)
Calcium: 9.1 mg/dL (ref 8.7–10.2)
Chloride: 105 mmol/L (ref 96–106)
Creatinine, Ser: 0.62 mg/dL (ref 0.57–1.00)
Globulin, Total: 2.5 g/dL (ref 1.5–4.5)
Glucose: 105 mg/dL — ABNORMAL HIGH (ref 70–99)
Potassium: 4.4 mmol/L (ref 3.5–5.2)
Sodium: 142 mmol/L (ref 134–144)
Total Protein: 6.9 g/dL (ref 6.0–8.5)
eGFR: 111 mL/min/{1.73_m2} (ref >59)

## 2024-02-02 LAB — LIPID PANEL
Chol/HDL Ratio: 4.4 {ratio} (ref 0.0–4.4)
Cholesterol, Total: 155 mg/dL (ref 100–199)
HDL: 35 mg/dL — ABNORMAL LOW (ref >39)
LDL Chol Calc (NIH): 102 mg/dL — ABNORMAL HIGH (ref 0–99)
Triglycerides: 95 mg/dL (ref 0–149)
VLDL Cholesterol Cal: 18 mg/dL (ref 5–40)

## 2024-02-02 LAB — CERVICOVAGINAL ANCILLARY ONLY
Candida Glabrata: NEGATIVE
Candida Vaginitis: NEGATIVE
Chlamydia: NEGATIVE
Comment: NEGATIVE
Comment: NEGATIVE
Comment: NEGATIVE
Comment: NEGATIVE
Comment: NORMAL
Neisseria Gonorrhea: NEGATIVE
Trichomonas: NEGATIVE

## 2024-02-02 LAB — URINALYSIS, ROUTINE W REFLEX MICROSCOPIC
Bilirubin, UA: NEGATIVE
Glucose, UA: NEGATIVE
Ketones, UA: NEGATIVE
Leukocytes,UA: NEGATIVE
Nitrite, UA: NEGATIVE
Protein,UA: NEGATIVE
RBC, UA: NEGATIVE
Specific Gravity, UA: 1.022 (ref 1.005–1.030)
Urobilinogen, Ur: 0.2 mg/dL (ref 0.2–1.0)
pH, UA: 6 (ref 5.0–7.5)

## 2024-02-03 LAB — URINE CULTURE

## 2024-02-19 ENCOUNTER — Encounter: Payer: Self-pay | Admitting: Family Medicine

## 2024-02-19 ENCOUNTER — Ambulatory Visit (INDEPENDENT_AMBULATORY_CARE_PROVIDER_SITE_OTHER): Payer: 59 | Admitting: Family Medicine

## 2024-02-19 VITALS — BP 108/66 | HR 76 | Ht 61.0 in | Wt 199.0 lb

## 2024-02-19 DIAGNOSIS — K760 Fatty (change of) liver, not elsewhere classified: Secondary | ICD-10-CM

## 2024-02-19 DIAGNOSIS — Z0000 Encounter for general adult medical examination without abnormal findings: Secondary | ICD-10-CM | POA: Diagnosis not present

## 2024-02-19 DIAGNOSIS — Z6838 Body mass index (BMI) 38.0-38.9, adult: Secondary | ICD-10-CM

## 2024-02-19 DIAGNOSIS — R21 Rash and other nonspecific skin eruption: Secondary | ICD-10-CM

## 2024-02-19 DIAGNOSIS — E6609 Other obesity due to excess calories: Secondary | ICD-10-CM

## 2024-02-19 DIAGNOSIS — R7309 Other abnormal glucose: Secondary | ICD-10-CM

## 2024-02-19 DIAGNOSIS — E66812 Obesity, class 2: Secondary | ICD-10-CM

## 2024-02-19 DIAGNOSIS — B002 Herpesviral gingivostomatitis and pharyngotonsillitis: Secondary | ICD-10-CM

## 2024-02-19 LAB — POCT GLYCOSYLATED HEMOGLOBIN (HGB A1C): Hemoglobin A1C: 6.2 % — AB (ref 4.0–5.6)

## 2024-02-19 NOTE — Patient Instructions (Signed)
 It was great to see you again today.  Follow up with GI office for your elevated liver tests Checking A1c today  Be well, Dr. Pollie Meyer

## 2024-02-19 NOTE — Progress Notes (Signed)
  Date of Visit: 02/19/2024   SUBJECTIVE:   HPI:  Carolyn Mendez presents today for a well woman exam. Spanish interpreter utilized during today's visit.   Concerns today: rash on abdomen, see below Periods: monthly, no issues, sometimes 4 days late, sometimes heavy, other times light, but always distinct periods of bleeding Contraception: abstinent currently Pelvic symptoms: no vaginal discharge or pelvic pain Sexual activity: abstinent STD Screening: negative gc/chl/trich at last visit Pap smear status: due 2027 Exercise: goes to park at work to walk Smoking: no Alcohol: no Drugs: no Mood: overall good, no concerns (just tired from having two jobs) Dentist: Sees every 6 months Cancers in family: none  Rash on abdomen: on anterior abdomen for 4 months, started initially on the right side, now spread to left side. Itches sometimes (especially at night when asleep). Not painful. No fevers. No new medications/foods. Has tried applying moistuerizer cream.  Has spot on lower lip in the middle that emerged about 1 week ago.  It was painful, but is improving gradually.  Denies any history of having cold sores in the past.  OBJECTIVE:   BP 108/66   Pulse 76   Ht 5\' 1"  (1.549 m)   Wt 199 lb (90.3 kg)   LMP 01/21/2024   SpO2 97%   BMI 37.60 kg/m  Gen: NAD, pleasant, cooperative HEENT: NCAT, no palpable thyromegaly or anterior cervical lymphadenopathy.  Small erosion on midline lower lip.  To see photo below. Heart: RRR, no murmurs Lungs: CTAB, NWOB Abdomen: soft, nontender to palpation, no masses or organomegaly.  Liver nonpalpable. Neuro: grossly nonfocal, speech normal Skin: Scattered areas of very faint erythematous papules on anterior abdomen.  Very difficult to see.  See photo below.       ASSESSMENT/PLAN:    Assessment & Plan Routine adult health maintenance -STD screening: Negative at last visit -pap smear: Up-to-date, due in 2027 -mammogram: Up-to-date -lipid  screening: Normal lipids at last visit -colon cancer screening: Up-to-date -immunizations:  Flu: UTD Tdap: UTD COVID: UTD Pneumovax: UTD -handout given on health maintenance topics Elevated glucose level Check A1c today Fatty liver LFTs mildly elevated at last visit.  Reviewed labs with patient today.  Recommend she follow-up with GI for her fatty liver.  Weight loss is important. Class 2 obesity due to excess calories without serious comorbidity with body mass index (BMI) of 38.0 to 38.9 in adult Encouraged exercise and healthy eating.  Offered to refer to nutritionist, she is already seeing one via her family members appointments. Rash Very faint rash visible on exam today.  Suspect dry skin.  Recommend topical application of Vaseline. Oral herpes Improving, present for over 1 week.  No role for antivirals in this scenario.  Advised it should heal up on its own.   [place] J. Pollie Meyer, MD [facility]

## 2024-02-19 NOTE — Assessment & Plan Note (Signed)
 Encouraged exercise and healthy eating.  Offered to refer to nutritionist, she is already seeing one via her family members appointments.

## 2024-02-19 NOTE — Assessment & Plan Note (Signed)
-  STD screening: Negative at last visit -pap smear: Up-to-date, due in 2027 -mammogram: Up-to-date -lipid screening: Normal lipids at last visit -colon cancer screening: Up-to-date -immunizations:  Flu: UTD Tdap: UTD COVID: UTD Pneumovax: UTD -handout given on health maintenance topics

## 2024-02-19 NOTE — Assessment & Plan Note (Signed)
 LFTs mildly elevated at last visit.  Reviewed labs with patient today.  Recommend she follow-up with GI for her fatty liver.  Weight loss is important.
